# Patient Record
Sex: Female | Born: 1950
Health system: Southern US, Community
[De-identification: ages and names within clinical notes are randomized; demographics above are authoritative.]

## PROBLEM LIST (undated history)

## (undated) DIAGNOSIS — E119 Type 2 diabetes mellitus without complications: Secondary | ICD-10-CM

## (undated) DIAGNOSIS — E785 Hyperlipidemia, unspecified: Secondary | ICD-10-CM

## (undated) DIAGNOSIS — I10 Essential (primary) hypertension: Secondary | ICD-10-CM

## (undated) HISTORY — DX: Essential (primary) hypertension: I10

## (undated) HISTORY — DX: Type 2 diabetes mellitus without complications: E11.9

## (undated) HISTORY — DX: Morbid (severe) obesity due to excess calories: E66.01

## (undated) HISTORY — DX: Hyperlipidemia, unspecified: E78.5

---

## 1995-06-03 HISTORY — PX: ABDOMINAL HYSTERECTOMY: SHX81

## 2002-01-07 ENCOUNTER — Encounter: Admission: RE | Admit: 2002-01-07 | Discharge: 2002-01-07 | Payer: Self-pay | Admitting: Internal Medicine

## 2002-04-21 ENCOUNTER — Encounter: Admission: RE | Admit: 2002-04-21 | Discharge: 2002-04-21 | Payer: Self-pay | Admitting: Internal Medicine

## 2002-08-02 ENCOUNTER — Encounter: Admission: RE | Admit: 2002-08-02 | Discharge: 2002-08-02 | Payer: Self-pay | Admitting: Internal Medicine

## 2002-08-12 ENCOUNTER — Encounter: Admission: RE | Admit: 2002-08-12 | Discharge: 2002-08-12 | Payer: Self-pay | Admitting: Internal Medicine

## 2002-12-13 ENCOUNTER — Ambulatory Visit (HOSPITAL_COMMUNITY): Admission: RE | Admit: 2002-12-13 | Discharge: 2002-12-13 | Payer: Self-pay | Admitting: Family Medicine

## 2003-01-17 ENCOUNTER — Encounter: Admission: RE | Admit: 2003-01-17 | Discharge: 2003-01-17 | Payer: Self-pay | Admitting: Internal Medicine

## 2003-03-29 ENCOUNTER — Encounter: Admission: RE | Admit: 2003-03-29 | Discharge: 2003-03-29 | Payer: Self-pay | Admitting: Internal Medicine

## 2003-04-25 ENCOUNTER — Encounter: Admission: RE | Admit: 2003-04-25 | Discharge: 2003-04-25 | Payer: Self-pay | Admitting: Internal Medicine

## 2003-05-01 ENCOUNTER — Encounter: Admission: RE | Admit: 2003-05-01 | Discharge: 2003-05-01 | Payer: Self-pay | Admitting: Internal Medicine

## 2003-05-02 ENCOUNTER — Encounter: Admission: RE | Admit: 2003-05-02 | Discharge: 2003-05-02 | Payer: Self-pay | Admitting: Internal Medicine

## 2003-10-06 ENCOUNTER — Encounter: Admission: RE | Admit: 2003-10-06 | Discharge: 2003-10-06 | Payer: Self-pay | Admitting: Internal Medicine

## 2003-10-19 ENCOUNTER — Encounter: Admission: RE | Admit: 2003-10-19 | Discharge: 2003-10-19 | Payer: Self-pay | Admitting: Internal Medicine

## 2003-12-20 ENCOUNTER — Encounter: Admission: RE | Admit: 2003-12-20 | Discharge: 2003-12-20 | Payer: Self-pay | Admitting: Internal Medicine

## 2004-01-22 ENCOUNTER — Encounter: Admission: RE | Admit: 2004-01-22 | Discharge: 2004-01-22 | Payer: Self-pay | Admitting: Internal Medicine

## 2004-03-26 ENCOUNTER — Ambulatory Visit: Payer: Self-pay | Admitting: Internal Medicine

## 2004-05-02 ENCOUNTER — Ambulatory Visit: Payer: Self-pay | Admitting: Internal Medicine

## 2004-06-20 ENCOUNTER — Ambulatory Visit: Payer: Self-pay | Admitting: Internal Medicine

## 2004-06-27 ENCOUNTER — Ambulatory Visit: Payer: Self-pay | Admitting: Internal Medicine

## 2004-08-06 ENCOUNTER — Ambulatory Visit: Payer: Self-pay | Admitting: Internal Medicine

## 2004-09-20 ENCOUNTER — Ambulatory Visit: Payer: Self-pay | Admitting: Internal Medicine

## 2004-09-26 ENCOUNTER — Ambulatory Visit: Payer: Self-pay

## 2004-12-18 ENCOUNTER — Ambulatory Visit: Payer: Self-pay | Admitting: Hospitalist

## 2005-02-25 ENCOUNTER — Ambulatory Visit: Payer: Self-pay | Admitting: Hospitalist

## 2005-05-16 ENCOUNTER — Ambulatory Visit: Payer: Self-pay | Admitting: Internal Medicine

## 2005-06-18 ENCOUNTER — Ambulatory Visit: Payer: Self-pay | Admitting: Internal Medicine

## 2005-06-19 ENCOUNTER — Ambulatory Visit: Payer: Self-pay | Admitting: Internal Medicine

## 2005-06-23 ENCOUNTER — Ambulatory Visit: Payer: Self-pay | Admitting: Internal Medicine

## 2005-08-14 ENCOUNTER — Ambulatory Visit: Payer: Self-pay | Admitting: Hospitalist

## 2005-08-19 ENCOUNTER — Ambulatory Visit: Payer: Self-pay | Admitting: Hospitalist

## 2005-09-16 ENCOUNTER — Ambulatory Visit: Payer: Self-pay | Admitting: Hospitalist

## 2005-09-23 ENCOUNTER — Ambulatory Visit: Payer: Self-pay | Admitting: Internal Medicine

## 2005-11-20 ENCOUNTER — Ambulatory Visit: Payer: Self-pay | Admitting: Hospitalist

## 2006-03-17 DIAGNOSIS — E785 Hyperlipidemia, unspecified: Secondary | ICD-10-CM

## 2006-03-17 DIAGNOSIS — Z9079 Acquired absence of other genital organ(s): Secondary | ICD-10-CM | POA: Insufficient documentation

## 2006-03-17 DIAGNOSIS — E119 Type 2 diabetes mellitus without complications: Secondary | ICD-10-CM

## 2006-03-17 DIAGNOSIS — I10 Essential (primary) hypertension: Secondary | ICD-10-CM

## 2006-03-17 DIAGNOSIS — E1142 Type 2 diabetes mellitus with diabetic polyneuropathy: Secondary | ICD-10-CM | POA: Insufficient documentation

## 2006-03-17 DIAGNOSIS — A630 Anogenital (venereal) warts: Secondary | ICD-10-CM

## 2006-03-17 HISTORY — DX: Hyperlipidemia, unspecified: E78.5

## 2006-03-17 HISTORY — DX: Type 2 diabetes mellitus without complications: E11.9

## 2006-03-17 HISTORY — DX: Essential (primary) hypertension: I10

## 2006-07-29 ENCOUNTER — Ambulatory Visit: Payer: Self-pay | Admitting: Hospitalist

## 2006-07-29 ENCOUNTER — Encounter (INDEPENDENT_AMBULATORY_CARE_PROVIDER_SITE_OTHER): Payer: Self-pay | Admitting: Unknown Physician Specialty

## 2006-07-29 LAB — CONVERTED CEMR LAB: Hgb A1c MFr Bld: 7.8 %

## 2006-07-30 ENCOUNTER — Encounter (INDEPENDENT_AMBULATORY_CARE_PROVIDER_SITE_OTHER): Payer: Self-pay | Admitting: Internal Medicine

## 2006-07-31 ENCOUNTER — Ambulatory Visit: Payer: Self-pay | Admitting: Internal Medicine

## 2006-07-31 ENCOUNTER — Encounter (INDEPENDENT_AMBULATORY_CARE_PROVIDER_SITE_OTHER): Payer: Self-pay | Admitting: Unknown Physician Specialty

## 2006-07-31 LAB — CONVERTED CEMR LAB
Albumin: 4.6 g/dL (ref 3.5–5.2)
CO2: 25 meq/L (ref 19–32)
Chloride: 99 meq/L (ref 96–112)
Creatinine, Ser: 0.83 mg/dL (ref 0.40–1.20)
Glucose, Bld: 192 mg/dL — ABNORMAL HIGH (ref 70–99)
Helicobacter Pylori Antibody-IgG: >8 — ABNORMAL HIGH
Indirect Bilirubin: 0.5 mg/dL (ref 0.0–0.9)
Sodium: 140 meq/L (ref 135–145)
Total Protein: 7.6 g/dL (ref 6.0–8.3)

## 2006-08-10 ENCOUNTER — Ambulatory Visit: Payer: Self-pay | Admitting: Hospitalist

## 2006-08-10 ENCOUNTER — Ambulatory Visit: Payer: Self-pay | Admitting: Internal Medicine

## 2006-08-11 ENCOUNTER — Telehealth (INDEPENDENT_AMBULATORY_CARE_PROVIDER_SITE_OTHER): Payer: Self-pay | Admitting: *Deleted

## 2006-08-13 ENCOUNTER — Ambulatory Visit: Payer: Self-pay | Admitting: Hospitalist

## 2006-08-13 LAB — CONVERTED CEMR LAB: Blood Glucose, Fingerstick: 161

## 2006-08-31 ENCOUNTER — Ambulatory Visit: Payer: Self-pay | Admitting: *Deleted

## 2006-08-31 ENCOUNTER — Encounter (INDEPENDENT_AMBULATORY_CARE_PROVIDER_SITE_OTHER): Payer: Self-pay | Admitting: Internal Medicine

## 2006-08-31 LAB — CONVERTED CEMR LAB
ALT: 39 units/L — ABNORMAL HIGH (ref 0–35)
AST: 28 units/L (ref 0–37)
Alkaline Phosphatase: 69 units/L (ref 39–117)
Blood Glucose, Fingerstick: 234
Creatinine, Ser: 0.89 mg/dL (ref 0.40–1.20)
Total Bilirubin: 0.5 mg/dL (ref 0.3–1.2)

## 2006-09-09 ENCOUNTER — Telehealth (INDEPENDENT_AMBULATORY_CARE_PROVIDER_SITE_OTHER): Payer: Self-pay | Admitting: Hospitalist

## 2006-09-25 ENCOUNTER — Ambulatory Visit: Payer: Self-pay | Admitting: Hospitalist

## 2006-09-26 LAB — CONVERTED CEMR LAB
HDL: 52 mg/dL (ref >39)
LDL Cholesterol: 143 mg/dL — ABNORMAL HIGH (ref 0–99)
Total CHOL/HDL Ratio: 4.2

## 2006-11-04 ENCOUNTER — Telehealth (INDEPENDENT_AMBULATORY_CARE_PROVIDER_SITE_OTHER): Payer: Self-pay | Admitting: Pharmacy Technician

## 2006-11-09 ENCOUNTER — Telehealth (INDEPENDENT_AMBULATORY_CARE_PROVIDER_SITE_OTHER): Payer: Self-pay | Admitting: *Deleted

## 2006-12-03 ENCOUNTER — Ambulatory Visit: Payer: Self-pay | Admitting: Hospitalist

## 2006-12-03 DIAGNOSIS — M79609 Pain in unspecified limb: Secondary | ICD-10-CM | POA: Insufficient documentation

## 2006-12-03 LAB — CONVERTED CEMR LAB: Hgb A1c MFr Bld: 7 %

## 2006-12-11 ENCOUNTER — Ambulatory Visit: Payer: Self-pay | Admitting: Vascular Surgery

## 2006-12-11 ENCOUNTER — Ambulatory Visit (HOSPITAL_COMMUNITY): Admission: RE | Admit: 2006-12-11 | Discharge: 2006-12-11 | Payer: Self-pay | Admitting: Hospitalist

## 2006-12-11 ENCOUNTER — Encounter (INDEPENDENT_AMBULATORY_CARE_PROVIDER_SITE_OTHER): Payer: Self-pay | Admitting: Hospitalist

## 2006-12-21 ENCOUNTER — Telehealth (INDEPENDENT_AMBULATORY_CARE_PROVIDER_SITE_OTHER): Payer: Self-pay | Admitting: *Deleted

## 2006-12-25 ENCOUNTER — Encounter (INDEPENDENT_AMBULATORY_CARE_PROVIDER_SITE_OTHER): Payer: Self-pay | Admitting: Hospitalist

## 2006-12-25 ENCOUNTER — Ambulatory Visit: Payer: Self-pay | Admitting: Internal Medicine

## 2006-12-25 LAB — CONVERTED CEMR LAB
BUN: 12 mg/dL (ref 6–23)
Creatinine, Ser: 0.9 mg/dL (ref 0.40–1.20)
Total CK: 137 units/L (ref 7–177)

## 2007-01-05 ENCOUNTER — Telehealth (INDEPENDENT_AMBULATORY_CARE_PROVIDER_SITE_OTHER): Payer: Self-pay | Admitting: Pharmacy Technician

## 2007-01-08 ENCOUNTER — Ambulatory Visit (HOSPITAL_COMMUNITY): Admission: RE | Admit: 2007-01-08 | Discharge: 2007-01-08 | Payer: Self-pay | Admitting: Internal Medicine

## 2007-01-08 ENCOUNTER — Encounter (INDEPENDENT_AMBULATORY_CARE_PROVIDER_SITE_OTHER): Payer: Self-pay | Admitting: Internal Medicine

## 2007-01-08 ENCOUNTER — Ambulatory Visit: Payer: Self-pay | Admitting: Internal Medicine

## 2007-01-08 DIAGNOSIS — M25569 Pain in unspecified knee: Secondary | ICD-10-CM

## 2007-01-08 LAB — CONVERTED CEMR LAB: Blood Glucose, Fingerstick: 278

## 2007-02-16 ENCOUNTER — Telehealth: Payer: Self-pay | Admitting: *Deleted

## 2007-03-04 ENCOUNTER — Telehealth (INDEPENDENT_AMBULATORY_CARE_PROVIDER_SITE_OTHER): Payer: Self-pay | Admitting: Hospitalist

## 2007-03-15 ENCOUNTER — Ambulatory Visit: Payer: Self-pay | Admitting: Hospitalist

## 2007-04-02 ENCOUNTER — Encounter: Admission: RE | Admit: 2007-04-02 | Discharge: 2007-05-24 | Payer: Self-pay | Admitting: Hospitalist

## 2007-06-09 ENCOUNTER — Encounter (INDEPENDENT_AMBULATORY_CARE_PROVIDER_SITE_OTHER): Payer: Self-pay | Admitting: Hospitalist

## 2007-07-15 ENCOUNTER — Telehealth (INDEPENDENT_AMBULATORY_CARE_PROVIDER_SITE_OTHER): Payer: Self-pay | Admitting: Hospitalist

## 2007-07-15 ENCOUNTER — Ambulatory Visit: Payer: Self-pay | Admitting: Hospitalist

## 2007-07-15 LAB — CONVERTED CEMR LAB: Blood Glucose, Fingerstick: 113

## 2007-09-20 ENCOUNTER — Telehealth: Payer: Self-pay | Admitting: *Deleted

## 2007-09-21 ENCOUNTER — Telehealth: Payer: Self-pay | Admitting: *Deleted

## 2007-11-12 ENCOUNTER — Ambulatory Visit: Payer: Self-pay | Admitting: *Deleted

## 2007-11-12 ENCOUNTER — Encounter: Payer: Self-pay | Admitting: Internal Medicine

## 2007-11-12 LAB — CONVERTED CEMR LAB
ALT: 78 units/L — ABNORMAL HIGH (ref 0–35)
AST: 51 units/L — ABNORMAL HIGH (ref 0–37)
Blood Glucose, Fingerstick: 178
CO2: 26 meq/L (ref 19–32)
Hgb A1c MFr Bld: 6.8 %
Sodium: 143 meq/L (ref 135–145)
Total Bilirubin: 0.4 mg/dL (ref 0.3–1.2)
Total Protein: 7.1 g/dL (ref 6.0–8.3)

## 2007-11-24 ENCOUNTER — Telehealth (INDEPENDENT_AMBULATORY_CARE_PROVIDER_SITE_OTHER): Payer: Self-pay | Admitting: Pharmacy Technician

## 2007-11-24 ENCOUNTER — Ambulatory Visit: Payer: Self-pay | Admitting: Hospitalist

## 2007-11-24 LAB — CONVERTED CEMR LAB: Blood Glucose, Fingerstick: 100

## 2007-12-10 ENCOUNTER — Encounter (INDEPENDENT_AMBULATORY_CARE_PROVIDER_SITE_OTHER): Payer: Self-pay | Admitting: Internal Medicine

## 2007-12-10 ENCOUNTER — Ambulatory Visit: Payer: Self-pay | Admitting: Infectious Diseases

## 2007-12-20 LAB — CONVERTED CEMR LAB
LDL Cholesterol: 117 mg/dL — ABNORMAL HIGH (ref 0–99)
Total CHOL/HDL Ratio: 3.7
VLDL: 27 mg/dL (ref 0–40)

## 2008-01-06 ENCOUNTER — Telehealth: Payer: Self-pay | Admitting: *Deleted

## 2008-01-25 ENCOUNTER — Ambulatory Visit: Payer: Self-pay | Admitting: Internal Medicine

## 2008-01-25 DIAGNOSIS — Z6841 Body Mass Index (BMI) 40.0 and over, adult: Secondary | ICD-10-CM

## 2008-01-25 DIAGNOSIS — K760 Fatty (change of) liver, not elsewhere classified: Secondary | ICD-10-CM | POA: Insufficient documentation

## 2008-01-25 HISTORY — DX: Morbid (severe) obesity due to excess calories: E66.01

## 2008-01-25 HISTORY — DX: Fatty (change of) liver, not elsewhere classified: K76.0

## 2008-01-26 LAB — CONVERTED CEMR LAB
ALT: 93 units/L — ABNORMAL HIGH (ref 0–35)
AST: 58 units/L — ABNORMAL HIGH (ref 0–37)
Albumin: 4.5 g/dL (ref 3.5–5.2)
Alkaline Phosphatase: 69 units/L (ref 39–117)
Glucose, Bld: 134 mg/dL — ABNORMAL HIGH (ref 70–99)
Potassium: 4.2 meq/L (ref 3.5–5.3)
Sodium: 143 meq/L (ref 135–145)
Total Bilirubin: 0.5 mg/dL (ref 0.3–1.2)
Total Protein: 7.8 g/dL (ref 6.0–8.3)

## 2008-01-31 ENCOUNTER — Ambulatory Visit: Payer: Self-pay | Admitting: Internal Medicine

## 2008-02-03 ENCOUNTER — Ambulatory Visit: Payer: Self-pay | Admitting: Internal Medicine

## 2008-02-24 ENCOUNTER — Telehealth: Payer: Self-pay | Admitting: *Deleted

## 2008-03-08 ENCOUNTER — Encounter (INDEPENDENT_AMBULATORY_CARE_PROVIDER_SITE_OTHER): Payer: Self-pay | Admitting: Internal Medicine

## 2008-03-13 ENCOUNTER — Encounter (INDEPENDENT_AMBULATORY_CARE_PROVIDER_SITE_OTHER): Payer: Self-pay | Admitting: Internal Medicine

## 2008-03-14 ENCOUNTER — Ambulatory Visit: Payer: Self-pay | Admitting: Internal Medicine

## 2008-03-15 LAB — CONVERTED CEMR LAB
ALT: 73 units/L — ABNORMAL HIGH (ref 0–35)
AST: 54 units/L — ABNORMAL HIGH (ref 0–37)
Alkaline Phosphatase: 67 units/L (ref 39–117)
Bilirubin, Direct: 0.2 mg/dL (ref 0.0–0.3)
Indirect Bilirubin: 0.4 mg/dL (ref 0.0–0.9)
Total Bilirubin: 0.6 mg/dL (ref 0.3–1.2)

## 2008-03-16 ENCOUNTER — Ambulatory Visit (HOSPITAL_COMMUNITY): Admission: RE | Admit: 2008-03-16 | Discharge: 2008-03-16 | Payer: Self-pay | Admitting: Internal Medicine

## 2008-03-27 ENCOUNTER — Ambulatory Visit: Payer: Self-pay | Admitting: Internal Medicine

## 2008-03-27 LAB — CONVERTED CEMR LAB: OCCULT 3: NEGATIVE

## 2008-04-04 ENCOUNTER — Encounter (INDEPENDENT_AMBULATORY_CARE_PROVIDER_SITE_OTHER): Payer: Self-pay | Admitting: Internal Medicine

## 2008-04-07 ENCOUNTER — Telehealth (INDEPENDENT_AMBULATORY_CARE_PROVIDER_SITE_OTHER): Payer: Self-pay | Admitting: Pharmacy Technician

## 2008-04-18 ENCOUNTER — Encounter (INDEPENDENT_AMBULATORY_CARE_PROVIDER_SITE_OTHER): Payer: Self-pay | Admitting: Internal Medicine

## 2008-07-05 ENCOUNTER — Ambulatory Visit: Payer: Self-pay | Admitting: Internal Medicine

## 2008-07-07 LAB — CONVERTED CEMR LAB
ALT: 39 units/L — ABNORMAL HIGH (ref 0–35)
Albumin: 4.2 g/dL (ref 3.5–5.2)
CO2: 23 meq/L (ref 19–32)
Cholesterol: 194 mg/dL (ref 0–200)
Glucose, Bld: 208 mg/dL — ABNORMAL HIGH (ref 70–99)
LDL Cholesterol: 105 mg/dL — ABNORMAL HIGH (ref 0–99)
Potassium: 4.3 meq/L (ref 3.5–5.3)
Sodium: 141 meq/L (ref 135–145)
Total Bilirubin: 0.4 mg/dL (ref 0.3–1.2)
Total Protein: 7.3 g/dL (ref 6.0–8.3)
Triglycerides: 133 mg/dL (ref <150)
VLDL: 27 mg/dL (ref 0–40)

## 2008-08-04 ENCOUNTER — Ambulatory Visit: Payer: Self-pay | Admitting: Internal Medicine

## 2008-08-07 LAB — CONVERTED CEMR LAB
AST: 17 units/L (ref 0–37)
Albumin: 4.2 g/dL (ref 3.5–5.2)
Alkaline Phosphatase: 58 units/L (ref 39–117)
BUN: 12 mg/dL (ref 6–23)
Calcium: 9.6 mg/dL (ref 8.4–10.5)
Chloride: 105 meq/L (ref 96–112)
LDL Cholesterol: 112 mg/dL — ABNORMAL HIGH (ref 0–99)
Potassium: 4.5 meq/L (ref 3.5–5.3)
Sodium: 144 meq/L (ref 135–145)
Total Protein: 7.1 g/dL (ref 6.0–8.3)

## 2008-09-06 ENCOUNTER — Telehealth (INDEPENDENT_AMBULATORY_CARE_PROVIDER_SITE_OTHER): Payer: Self-pay | Admitting: Pharmacy Technician

## 2008-09-26 ENCOUNTER — Telehealth (INDEPENDENT_AMBULATORY_CARE_PROVIDER_SITE_OTHER): Payer: Self-pay | Admitting: Pharmacy Technician

## 2008-09-27 ENCOUNTER — Telehealth (INDEPENDENT_AMBULATORY_CARE_PROVIDER_SITE_OTHER): Payer: Self-pay | Admitting: Pharmacy Technician

## 2008-10-24 ENCOUNTER — Ambulatory Visit: Payer: Self-pay | Admitting: Internal Medicine

## 2008-10-24 LAB — CONVERTED CEMR LAB
Blood Glucose, Fingerstick: 145
Hgb A1c MFr Bld: 6.7 %

## 2008-10-24 LAB — HM DIABETES FOOT EXAM

## 2008-10-26 ENCOUNTER — Encounter (INDEPENDENT_AMBULATORY_CARE_PROVIDER_SITE_OTHER): Payer: Self-pay | Admitting: Internal Medicine

## 2008-12-20 ENCOUNTER — Telehealth: Payer: Self-pay | Admitting: *Deleted

## 2008-12-27 ENCOUNTER — Telehealth: Payer: Self-pay | Admitting: *Deleted

## 2009-01-18 ENCOUNTER — Ambulatory Visit: Payer: Self-pay | Admitting: Internal Medicine

## 2009-01-18 LAB — CONVERTED CEMR LAB
Blood Glucose, Fingerstick: 199
Hgb A1c MFr Bld: 6.9 %

## 2009-02-13 ENCOUNTER — Ambulatory Visit: Payer: Self-pay | Admitting: Internal Medicine

## 2009-03-22 ENCOUNTER — Telehealth (INDEPENDENT_AMBULATORY_CARE_PROVIDER_SITE_OTHER): Payer: Self-pay | Admitting: *Deleted

## 2009-04-24 ENCOUNTER — Ambulatory Visit: Payer: Self-pay | Admitting: Internal Medicine

## 2009-04-24 DIAGNOSIS — M542 Cervicalgia: Secondary | ICD-10-CM

## 2009-04-24 DIAGNOSIS — R05 Cough: Secondary | ICD-10-CM

## 2009-04-24 LAB — CONVERTED CEMR LAB: Hgb A1c MFr Bld: 7 %

## 2009-04-25 ENCOUNTER — Encounter: Payer: Self-pay | Admitting: Internal Medicine

## 2009-05-08 ENCOUNTER — Ambulatory Visit: Payer: Self-pay | Admitting: Internal Medicine

## 2009-05-08 LAB — CONVERTED CEMR LAB
AST: 16 units/L (ref 0–37)
Albumin: 4.1 g/dL (ref 3.5–5.2)
BUN: 12 mg/dL (ref 6–23)
CO2: 26 meq/L (ref 19–32)
Calcium: 9.8 mg/dL (ref 8.4–10.5)
Chloride: 101 meq/L (ref 96–112)
Cholesterol: 211 mg/dL — ABNORMAL HIGH (ref 0–200)
Creatinine, Ser: 0.8 mg/dL (ref 0.40–1.20)
Glucose, Bld: 178 mg/dL — ABNORMAL HIGH (ref 70–99)
HDL: 53 mg/dL (ref >39)
Potassium: 3.9 meq/L (ref 3.5–5.3)
Triglycerides: 175 mg/dL — ABNORMAL HIGH (ref <150)

## 2009-05-18 ENCOUNTER — Ambulatory Visit: Payer: Self-pay | Admitting: Infectious Diseases

## 2009-06-25 ENCOUNTER — Ambulatory Visit (HOSPITAL_COMMUNITY): Admission: RE | Admit: 2009-06-25 | Discharge: 2009-06-25 | Payer: Self-pay | Admitting: Gastroenterology

## 2009-08-03 ENCOUNTER — Telehealth: Payer: Self-pay | Admitting: Internal Medicine

## 2009-08-03 ENCOUNTER — Ambulatory Visit: Payer: Self-pay | Admitting: Internal Medicine

## 2009-08-17 ENCOUNTER — Telehealth (INDEPENDENT_AMBULATORY_CARE_PROVIDER_SITE_OTHER): Payer: Self-pay | Admitting: *Deleted

## 2009-08-21 ENCOUNTER — Encounter: Payer: Self-pay | Admitting: Internal Medicine

## 2009-08-23 ENCOUNTER — Ambulatory Visit: Payer: Self-pay | Admitting: Internal Medicine

## 2009-08-23 LAB — CONVERTED CEMR LAB
BUN: 13 mg/dL (ref 6–23)
CO2: 24 meq/L (ref 19–32)
Cholesterol: 217 mg/dL — ABNORMAL HIGH (ref 0–200)
Glucose, Bld: 198 mg/dL — ABNORMAL HIGH (ref 70–99)
HDL: 57 mg/dL (ref >39)
Sodium: 141 meq/L (ref 135–145)
Total Bilirubin: 0.5 mg/dL (ref 0.3–1.2)
Total Protein: 7.2 g/dL (ref 6.0–8.3)
Triglycerides: 125 mg/dL (ref <150)
VLDL: 25 mg/dL (ref 0–40)

## 2009-08-24 ENCOUNTER — Ambulatory Visit: Payer: Self-pay | Admitting: Internal Medicine

## 2009-09-18 ENCOUNTER — Telehealth: Payer: Self-pay | Admitting: Internal Medicine

## 2009-11-27 ENCOUNTER — Telehealth: Payer: Self-pay | Admitting: Internal Medicine

## 2009-11-30 ENCOUNTER — Ambulatory Visit: Payer: Self-pay | Admitting: Internal Medicine

## 2009-11-30 LAB — CONVERTED CEMR LAB
Blood Glucose, Fingerstick: 156
Hgb A1c MFr Bld: 7.8 %

## 2009-12-05 ENCOUNTER — Telehealth: Payer: Self-pay | Admitting: Internal Medicine

## 2010-01-09 ENCOUNTER — Telehealth: Payer: Self-pay | Admitting: Internal Medicine

## 2010-01-11 ENCOUNTER — Telehealth: Payer: Self-pay | Admitting: Internal Medicine

## 2010-01-14 ENCOUNTER — Telehealth: Payer: Self-pay | Admitting: Internal Medicine

## 2010-02-05 ENCOUNTER — Telehealth: Payer: Self-pay | Admitting: Internal Medicine

## 2010-02-11 ENCOUNTER — Ambulatory Visit: Payer: Self-pay | Admitting: Internal Medicine

## 2010-02-20 ENCOUNTER — Ambulatory Visit (HOSPITAL_COMMUNITY): Admission: RE | Admit: 2010-02-20 | Discharge: 2010-02-20 | Payer: Self-pay | Admitting: Internal Medicine

## 2010-02-20 LAB — HM MAMMOGRAPHY: HM Mammogram: NEGATIVE

## 2010-06-23 ENCOUNTER — Encounter: Payer: Self-pay | Admitting: Internal Medicine

## 2010-07-04 NOTE — Progress Notes (Signed)
Summary: refill/ hla  Phone Note Refill Request Message from:  Fax from Pharmacy on January 09, 2010 5:58 PM  Refills Requested: Medication #1:  HYDROCHLOROTHIAZIDE 25 MG TABS Take 1 tablet by mouth once a day.   Dosage confirmed as above?Dosage Confirmed   Last Refilled: 6/28 Initial call taken by: Freddy Finner RN,  January 09, 2010 5:58 PM  Follow-up for Phone Call        Rx submitted to pharmacy. Follow-up by: Eduardo Osier DO,  January 11, 2010 11:32 AM    Prescriptions: HYDROCHLOROTHIAZIDE 25 MG TABS (HYDROCHLOROTHIAZIDE) Take 1 tablet by mouth once a day.  #30 Each x 6   Entered and Authorized by:   Eduardo Osier DO   Signed by:   Eduardo Osier DO on 01/11/2010   Method used:   Electronically to        [facility] [phone]* (retail)       [address]       [place], [place]  [address]       Ph: [phone]       Fax: [phone]   RxID:   [id number]

## 2010-07-04 NOTE — Assessment & Plan Note (Signed)
Summary: CHECKUP/SB.   Vital Signs:  Patient profile:   60 year old female Height:      65 inches (165.10 cm) Weight:      293.04 pounds (133.20 kg) BMI:     48.94 Temp:     98 degrees F (36.67 degrees C) oral Pulse rate:   83 / minute BP sitting:   145 / 83  (right arm) Cuff size:   large  Vitals Entered By: Sander Nephew RN (February 11, 2010 8:33 AM) Is Patient Diabetic? Yes Did you bring your meter with you today? No Pain Assessment Patient in pain? no      Nutritional Status BMI of > 30 = obese CBG Result 287  Have you ever been in a relationship where you felt threatened, hurt or afraid?No   Does patient need assistance? Functional Status Self care Ambulation Normal Comments Needs refills done for 90 days.  Check up   Primary Care Provider:  Eduardo Osier DO   History of Present Illness: Ms Sonya Lane is a 60 yo woman who is in today for follow up of her multiple medical problems.   1. DM - Taking meds as directed. No signs or symptoms of hypoglycemia.  Trying to avoid eating too much prepared/prepackaged food. 2. HTN -    BP still elevated today but improved with increase in carvedilol dose. Pt would prefer to not re-start ace 2/2 cough.  No adverse effects from other anti-HTN agents.  Denies HA, visual changes, dizziness, syncope,C/P, SOB, and DOE. 3. Hyperlipidemia - Still refusing statin.  Pt trying to increase dietary fiber intake and eat healthier. 4. Obesity - Trying to lose weight. Walking 3x a week.   Ms. Scarola has no other concerns or complaints today.  Depression History:      The patient denies a depressed mood most of the day.         Preventive Screening-Counseling & Management  Alcohol-Tobacco     Alcohol drinks/day: 0     Smoking Status: never  Current Medications (verified): 1)  Lisinopril 20 Mg  Tabs (Lisinopril) .... On Hold Re: Cough 2)  Hydrochlorothiazide 25 Mg Tabs (Hydrochlorothiazide) .... Take 1 Tablet By Mouth Once A Day. 3)   Glipizide 10 Mg Tabs (Glipizide) .... Take 1 Tablet By Mouth Twice A Day. 4)  Glucophage 1000 Mg Tabs (Metformin Hcl) .... Take One Tablet By Mouth Two Times A Day. 5)  Womens Multivitamin Plus  Tabs (Multiple Vitamins-Minerals) .... Take 1 Tablet By Mouth Once A Day 6)  Aspirin 81 Mg Tabs (Aspirin) .... Take 1 Tablet By Mouth Once A Day. 7)  Norvasc 10 Mg  Tabs (Amlodipine Besylate) .... Take 1 Tablet By Mouth Once A Day 8)  Carvedilol 25 Mg Tabs (Carvedilol) .... Take 1 Tablet By Mouth Two Times A Day 9)  Flexeril 5 Mg Tabs (Cyclobenzaprine Hcl) .... Take One Tablet By Mouth At Bedtime For Muscle Pain/spasm 10)  Claritin 10 Mg Tabs (Loratadine) .... Take 1 Tablet By Mouth Once A Day  Allergies (verified): 1)  ! Zocor 2)  ! Pravachol  Past History:  Past medical, surgical, family and social histories (including risk factors) reviewed for relevance to current acute and chronic problems.  Past Medical History: Reviewed history from 07/15/2007 and no changes required. Diabetes mellitus, type II- dx'd 2001 -  Eye exam 2006 -  Nl M/C ratio 7/05 Hyperlipidemia Hypertension- no sig proteinuira 7/05 Elevated Liver enzymes on Zocor. Most recentpanel nl 3/07 -  Hep B/C  neg 12/05 Human papillomavirus, vaginal warts UTI 10/04 Varicose veins, R>L leg OA- R knee tricompartmental medial/patellofemoral joint  Past Surgical History: Reviewed history from 03/17/2006 and no changes required. Caesarean section x 2, yrs unknown Hysterectomy, TAHBSOsecondry to endometrial bleeding, yr unknown  Family History: Reviewed history from 03/14/2008 and no changes required. Mother died of gyn cancer, unclear what type  Social History: Reviewed history from 04/24/2009 and no changes required. She has 5 children, 10 grandchildren.  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   Head:  normocephalic and atraumatic.   Eyes:  vision grossly intact, pupils equal, pupils round, and pupils reactive to light.   Mouth:  pharynx pink and moist and fair dentition.   Neck:  supple, full ROM, no masses, no thyroid nodules or tenderness, no JVD, no carotid bruits, and no cervical lymphadenopathy.   Lungs:  no accessory muscle use, normal breath sounds, no crackles, and no wheezes.   Heart:  normal rate, regular rhythm, no murmur, no gallop, and no rub.   Abdomen:  soft, non-tender, normal bowel sounds, no distention, no masses, and no guarding.   Extremities:  No clubbing, cyanosis, or edema. Neurologic:  alert & oriented X3, cranial nerves II-XII intact, strength normal in all extremities, sensation intact to light touch, and DTRs symmetrical and normal.   Skin:  turgor normal, no rashes, no ecchymoses, no petechiae, and no ulcerations.   Psych:  normally interactive, good eye contact, not anxious appearing, and not depressed appearing.     Impression & Recommendations:  Problem # 1:  HYPERTENSION (ICD-401.9) BP improved with increased Carvedilol.  Still slightly above goal, however pt reports drinking coffee prior to her appt this am.  Will continue to monitor closely before making further changed to her regimen.  Her updated medication list for this problem includes:    Lisinopril 20 Mg Tabs (Lisinopril) ..... On hold re: cough    Hydrochlorothiazide 25 Mg Tabs (Hydrochlorothiazide) .Marland Kitchen... Take 1 tablet by mouth once a day.    Norvasc 10 Mg Tabs (Amlodipine besylate) .Marland Kitchen... Take 1 tablet by mouth once a day    Carvedilol 25 Mg Tabs (Carvedilol) .Marland Kitchen... Take 1 tablet by mouth two times a day  BP today: 145/83 Prior BP: 151/88 (11/30/2009)  Labs Reviewed: K+: 4.0 (08/23/2009) Creat: : 0.79 (08/23/2009)   Chol: 217 (08/23/2009)   HDL: 57 (08/23/2009)    LDL: 135 (08/23/2009)   TG: 125 (08/23/2009)  Problem # 2:  DIABETES MELLITUS, TYPE II (ICD-250.00) Pt is compliant with her diabetic meds.  Not due for A1c yet, will check this at her next appt.  Cannot resume ACE inhibitor 2/2 cough; pt cannot afford ARB.    Her updated medication list for this problem includes:    Lisinopril 20 Mg Tabs (Lisinopril) ..... On hold re: cough    Glipizide 10 Mg Tabs (Glipizide) .Marland Kitchen... Take 1 tablet by mouth twice a day.    Glucophage 1000 Mg Tabs (Metformin hcl) .Marland Kitchen... Take one tablet by mouth two times a day.    Aspirin 81 Mg Tabs (Aspirin) .Marland Kitchen... Take 1 tablet by mouth once a day.  Orders: Capillary Blood Glucose/CBG [phone])  Labs Reviewed: Creat: 0.79 (08/23/2009)    Reviewed HgBA1c results: 7.8 (11/30/2009)  7.7 (08/24/2009)  Problem # 3:  PREVENTIVE  HEALTH CARE (ICD-V70.0) Pt is UTD on colonoscopy.  SHe is due for annual mammorgram.  WIll give flu vax today.  Complete Medication List: 1)  Lisinopril 20 Mg Tabs (Lisinopril) .... On hold re: cough 2)  Hydrochlorothiazide 25 Mg Tabs (Hydrochlorothiazide) .... Take 1 tablet by mouth once a day. 3)  Glipizide 10 Mg Tabs (Glipizide) .... Take 1 tablet by mouth twice a day. 4)  Glucophage 1000 Mg Tabs (Metformin hcl) .... Take one tablet by mouth two times a day. 5)  Womens Multivitamin Plus Tabs (Multiple vitamins-minerals) .... Take 1 tablet by mouth once a day 6)  Aspirin 81 Mg Tabs (Aspirin) .... Take 1 tablet by mouth once a day. 7)  Norvasc 10 Mg Tabs (Amlodipine besylate) .... Take 1 tablet by mouth once a day 8)  Carvedilol 25 Mg Tabs (Carvedilol) .... Take 1 tablet by mouth two times a day 9)  Flexeril 5 Mg Tabs (Cyclobenzaprine hcl) .... Take one tablet by mouth at bedtime for muscle pain/spasm 10)  Claritin 10 Mg Tabs (Loratadine) .... Take 1 tablet by mouth once a day  Other Orders: Mammogram (Screening) (Mammo)  Patient Instructions: 1)  Please schedule a follow-up appointment in 6 months  with Dr. Jerelene Redden, or sooner if needed. 2)  Keep taking your medicines every day. 3)  Your last A1c was 7.8 in July of 2011.  Try to limit the amount of sugars, sodas, sweet foods, and carbohydrates to get this number back down to 7.0. 4)  Keep up with your walking routine. 5)  See you in 6 months! Prescriptions: HYDROCHLOROTHIAZIDE 25 MG TABS (HYDROCHLOROTHIAZIDE) Take 1 tablet by mouth once a day.  #90 x 3   Entered and Authorized by:   Eduardo Osier DO   Signed by:   Eduardo Osier DO on 02/11/2010   Method used:   Electronically to        [facility] [phone]* (retail)       [address]       [place], [place]  [address]       Ph: [phone]       Fax: [phone]   RxID:   [id number] CARVEDILOL 25 MG TABS (CARVEDILOL) Take 1 tablet by mouth two times a day  #180 x 3   Entered and Authorized by:   Eduardo Osier DO   Signed by:   Eduardo Osier DO on 02/11/2010   Method used:   Electronically to        [facility] [phone]* (retail)       [address]       [place], [place]  [address]       Ph: [phone]       Fax: [phone]   RxID:   [id number]   Prevention & Chronic Care Immunizations   Influenza vaccine: Fluvax Non-MCR  (03/14/2008)   Influenza vaccine due: 03/14/2009    Tetanus booster: Not documented    Pneumococcal vaccine: Pneumovax  (03/14/2008)   Pneumococcal vaccine due: None    H. zoster vaccine: Not documented   H. zoster vaccine deferral: Deferred  (04/24/2009)  Colorectal Screening   Hemoccult: normal  (03/21/2008)   Hemoccult due: 03/21/2009    Colonoscopy: Not documented   Colonoscopy action/deferral: Deferred  (02/11/2010)  Other Screening   Pap smear: Not documented   Pap smear action/deferral: Not indicated S/P hysterectomy  (02/11/2010)    Mammogram: No specific mammographic evidence of malignancy.  No significant changes compared to previous study.  Assessment: BIRADS 1. Location: [facility].    (04/18/2008)   Mammogram action/deferral: Ordered  (02/11/2010)   Mammogram due: 05/2009    DXA bone density scan: Not documented   Smoking status: never  (02/11/2010)  Diabetes Mellitus   HgbA1C: 7.8  (11/30/2009)   HgbA1C action/deferral: Ordered  (01/18/2009)   Hemoglobin A1C due: 02/12/2008    Eye exam: Not documented   Diabetic eye exam action/deferral: Ophthalmology referral  (05/18/2009)    Foot exam: yes  (10/24/2008)   Foot exam action/deferral: Do today   High risk foot: No  (11/30/2009)   Foot care education: Done  (11/30/2009)    Urine microalbumin/creatinine ratio: 18.4  (04/25/2009)   Urine microalbumin action/deferral: Ordered    Diabetes flowsheet reviewed?: Yes   Progress toward A1C goal: Unchanged  Lipids   Total Cholesterol: 217  (08/23/2009)   Lipid panel action/deferral: Deferred   LDL: 135  (08/23/2009)   LDL Direct: Not documented   HDL: 57  (08/23/2009)   Triglycerides: 125  (08/23/2009)    SGOT (AST): 19  (08/23/2009)   SGPT (ALT): 25  (08/23/2009)   Alkaline phosphatase: 67  (08/23/2009)   Total bilirubin: 0.5  (08/23/2009)    Lipid flowsheet reviewed?: Yes   Progress toward LDL goal: Unchanged  Hypertension   Last Blood Pressure: 145 / 83  (02/11/2010)   Serum creatinine: 0.79  (08/23/2009)   Serum potassium 4.0  (08/23/2009)    Hypertension flowsheet reviewed?: Yes   Progress toward BP goal: Unchanged  Self-Management Support :   Personal Goals (by the next clinic visit) :     Personal A1C goal: 7  (05/18/2009)     Personal blood pressure goal: 130/80  (05/18/2009)     Personal LDL goal: 100  (05/18/2009)    Patient will work on the following items until the next clinic visit to reach self-care goals:     Medications and monitoring: take my medicines every day, bring all of my medications to every visit, examine my feet every day  (02/11/2010)     Eating: drink diet soda or water instead of juice or soda, eat more  vegetables, use fresh or frozen vegetables, eat foods that are low in salt, eat baked foods instead of fried foods, eat fruit for snacks and desserts, limit or avoid alcohol  (02/11/2010)     Activity: take a 30 minute walk every day  (02/11/2010)     Other: NO METER  (04/24/2009)    Diabetes self-management support: Written self-care plan, Education handout, Pre-printed educational material, Resources for patients handout  (02/11/2010)   Diabetes care plan printed   Diabetes education handout printed    Hypertension self-management support: Written self-care plan, Education handout, Pre-printed educational material, Resources for patients handout  (02/11/2010)   Hypertension self-care plan printed.   Hypertension education handout printed    Lipid self-management support: Written self-care plan, Education handout, Pre-printed educational material, Resources for patients handout  (02/11/2010)   Lipid self-care plan printed.   Lipid education handout printed      Resource handout printed.   Nursing Instructions: Give Flu vaccine today  Schedule screening mammogram (see order)     Vital Signs:  Patient profile:   60 year old female Height:      65 inches (165.10 cm) Weight:      293.04 pounds (133.20 kg) BMI:     48.94 Temp:     98 degrees F (36.67 degrees C) oral Pulse rate:  83 / minute BP sitting:   145 / 83  (right arm) Cuff size:   large  Vitals Entered By: Sander Nephew RN (February 11, 2010 8:33 AM)    Appended Document: CHECKUP/SB.   Immunizations Administered:  Influenza Vaccine # 1:    Vaccine Type: Fluvax Non-MCR    Site: right deltoid    Mfr: GlaxoSmithKline    Dose: 0.5 ml    Route: IM    Given by: Sander Nephew RN    Exp. Date: 11/30/2010    Lot #: [id number]    VIS given: 12/25/09 version given February 11, 2010.  Flu Vaccine Consent Questions:    Do you have a history of severe allergic reactions to this vaccine? no    Any prior history of  allergic reactions to egg and/or gelatin? no    Do you have a sensitivity to the preservative Thimersol? no    Do you have a past history of Guillan-Barre Syndrome? no    Do you currently have an acute febrile illness? no    Have you ever had a severe reaction to latex? no    Vaccine information given and explained to patient? yes    Are you currently pregnant? no

## 2010-07-04 NOTE — Progress Notes (Signed)
Summary: Prescription  Phone Note Call from Patient   Caller: Patient Call For: Sonya Osier DO Summary of Call: Call from pt said that she has not received her prescription for Norvasc.  [facility]Prescription was faxed in on 01/11/2010 to [facility].  S[oke with Clarene Critchley said that the pharmacy is behind on refills.  Prescription was called to [facility] and she will get it to [facility].  [facility] will call the pt when ready.  RTC to pt given message that she will be called when the prescription is ready for pick up. Sander Nephew RN  January 14, 2010 2:35 PM  Initial call taken by: Sander Nephew RN,  January 14, 2010 2:35 PM  Follow-up for Phone Call        Do I need to write a new script and re-fax it? Follow-up by: Sonya Osier DO,  January 15, 2010 10:15 AM  Additional Follow-up for Phone Call Additional follow up Details #1::        Pharmacy was called the new prescription on 01/14/2010.  Additional Follow-up by: Sander Nephew RN,  January 16, 2010 9:41 AM    Additional Follow-up for Phone Call Additional follow up Details #2::    Thanks! Follow-up by: Sonya Osier DO,  January 16, 2010 10:39 AM

## 2010-07-04 NOTE — Assessment & Plan Note (Signed)
Summary: EST-CK/FU/MEDS/CFB   Vital Signs:  Patient profile:   60 year old female Height:      65 inches (165.10 cm) Weight:      295.6 pounds (134.36 kg) BMI:     49.37 Temp:     97.9 degrees F oral Pulse rate:   91 / minute BP sitting:   151 / 88  (right arm)  Vitals Entered By: Morrison Old RN (November 30, 2009 4:04 PM) CC: F/U visit. No c/o's. Is Patient Diabetic? Yes Did you bring your meter with you today? No Pain Assessment Patient in pain? no      Nutritional Status BMI of > 30 = obese CBG Result 156  Have you ever been in a relationship where you felt threatened, hurt or afraid?No   Does patient need assistance? Functional Status Self care Ambulation Normal   Diabetic Foot Exam Last Podiatry Exam Date: 11/30/2009  Foot Inspection Is there a history of a foot ulcer?              No Is there a foot ulcer now?              No Can the patient see the bottom of their feet?          No Are the shoes appropriate in style and fit?          Yes Is there swelling or an abnormal foot shape?          No Are the toenails long?                No Are the toenails thick?                No Are the toenails ingrown?              No Is there heavy callous build-up?              No  Diabetic Foot Care Education Patient educated on appropriate care of diabetic feet.  Pulse Check          Right Foot          Left Foot Dorsalis Pedis:        normal            diminished  High Risk Feet? No   10-g (5.07) Semmes-Weinstein Monofilament Test Performed by: Morrison Old RN          Right Foot          Left Foot Visual Inspection     normal         normal Test Control      normal         normal Site 1         normal         normal Site 2         normal         normal Site 3         normal         normal Site 4         normal         normal Site 5         normal         normal Site 6         normal         normal Site 7         normal  normal Site 8         normal          normal Site 9         normal         normal   Primary Care Provider:  Eduardo Osier DO  CC:  F/U visit. No c/o's..  History of Present Illness: Ms Gardenhire is a 60 yo woman who is in today for follow up of her multiple medical problems.   1. DM - Taking meds as directed. No signs or symptoms of hypoglycemia.  2. HTN -    BP still elevated today and unchanged from her last appt at which time her Carvedilol was increased.  Ms Mikkelson did not take the increased dose of Carvedilol as directed and has only been taking one of her 12.30m tabs BID.  Pt would prefer to not re-start ace 2/2 cough.  Denies HA, visual changes, dizziness, syncope,C/P, SOB, and DOE. 3. Hyperlipidemia - Still refusing statin.  Pt trying to increase dietary fiber intake and eat healthier. 4. Obesity - Trying to lose weight. Walking 3x a week.   Ms. WFaxonhas no other concerns or complaints today.  Depression History:      The patient denies a depressed mood most of the day.         Preventive Screening-Counseling & Management  Alcohol-Tobacco     Alcohol drinks/day: 0     Smoking Status: never  Caffeine-Diet-Exercise     Does Patient Exercise: yes     Type of exercise: WATER AROBIC     Times/week: 3  Current Problems (verified): 1)  Other Screening Mammogram  (ICD-V76.12) 2)  Cough  (ICD-786.2) 3)  Foot Pain, Right  (ICD-729.5) 4)  Neck Pain, Acute  (ICD-723.1) 5)  Encounter For Long-term Use of Other Medications  (ICD-V58.69) 6)  Morbid Obesity  (ICD-278.01) 7)  Transaminases, Serum, Elevated  (ICD-790.4) 8)  Preventive Health Care  (ICD-V70.0) 9)  Knee Pain  (ICD-719.46) 10)  Screening For Malignant Neoplasm, Colon  (ICD-V76.51) 11)  Leg Pain, Right  (ICD-729.5) 12)  Broken Tooth, Infected  (IDUK-025.42 13)  Helicobacter Pylori Gastritis  (ICD-041.86) 14)  Hx of Venereal Wart  (ICD-078.11) 15)  Total Abdominal Hysterectomy, Hx of  (ICD-V45.77) 16)  Caesarean Section, Hx of  (ICD-V39.01) 17)   Hypertension  (ICD-401.9) 18)  Hyperlipidemia  (ICD-272.4) 19)  Diabetes Mellitus, Type II  (ICD-250.00)  Current Medications (verified): 1)  Lisinopril 20 Mg  Tabs (Lisinopril) .... On Hold Re: Cough 2)  Hydrochlorothiazide 25 Mg Tabs (Hydrochlorothiazide) .... Take 1 Tablet By Mouth Once A Day. 3)  Glipizide 10 Mg Tabs (Glipizide) .... Take 1 Tablet By Mouth Twice A Day. 4)  Glucophage 1000 Mg Tabs (Metformin Hcl) .... Take One Tablet By Mouth Two Times A Day. 5)  Womens Multivitamin Plus  Tabs (Multiple Vitamins-Minerals) .... Take 1 Tablet By Mouth Once A Day 6)  Aspirin 81 Mg Tabs (Aspirin) .... Take 1 Tablet By Mouth Once A Day. 7)  Norvasc 10 Mg  Tabs (Amlodipine Besylate) .... Take 1 Tablet By Mouth Once A Day 8)  Carvedilol 25 Mg Tabs (Carvedilol) 9)  Flexeril 5 Mg Tabs (Cyclobenzaprine Hcl) .... Take One Tablet By Mouth At Bedtime For Muscle Pain/spasm 10)  Claritin 10 Mg Tabs (Loratadine) .... Take 1 Tablet By Mouth Once A Day  Allergies (verified): 1)  ! Zocor 2)  ! Pravachol  Past History:  Past medical, surgical, family and social histories (including  risk factors) reviewed for relevance to current acute and chronic problems.  Past Medical History: Reviewed history from 07/15/2007 and no changes required. Diabetes mellitus, type II- dx'd 2001 -  Eye exam 2006 -  Nl M/C ratio 7/05 Hyperlipidemia Hypertension- no sig proteinuira 7/05 Elevated Liver enzymes on Zocor. Most recentpanel nl 3/07 -  Hep B/C neg 12/05 Human papillomavirus, vaginal warts UTI 10/04 Varicose veins, R>L leg OA- R knee tricompartmental medial/patellofemoral joint  Past Surgical History: Reviewed history from 03/17/2006 and no changes required. Caesarean section x 2, yrs unknown Hysterectomy, TAHBSOsecondry to endometrial bleeding, yr unknown  Family History: Reviewed history from 03/14/2008 and no changes required. Mother died of gyn cancer, unclear what type  Social History: Reviewed  history from 04/24/2009 and no changes required. She has 5 children, 10 grandchildren.  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   Head:  normocephalic and atraumatic.   Eyes:  vision grossly intact, pupils equal, pupils round, and pupils reactive to light.   Mouth:  pharynx pink and moist and fair dentition.   Lungs:  no accessory muscle use, normal breath sounds, no crackles, and no wheezes.   Heart:  normal rate, regular rhythm, no murmur, no gallop, and no rub.   Abdomen:  soft, non-tender, normal bowel sounds, no distention, no masses, and no guarding.   Extremities:  No clubbing, cyanosis, or edema. Neurologic:  alert & oriented X3, cranial nerves II-XII intact, strength normal in all extremities, sensation intact to light touch, and DTRs symmetrical and normal.   Psych:  normally interactive, good eye contact, not anxious appearing, and not depressed appearing.     Impression & Recommendations:  Problem # 1:  HYPERTENSION (ICD-401.9) Will increase carvedilol to 59m two times a day as discussed during Ms. Kleist's previous visit.  Stressed the need for good BP control to prevent debilitating and potentially fatal sequelae of persistent poorly controlled blood pressure.  Pt expresses understanding.  Encouraged pt to take all medicines regularly as directed.   Will bring her back in 2-4 weeks for BP check.   Her updated medication list for this problem includes:    Lisinopril 20 Mg Tabs (Lisinopril) ..... On hold re: cough    Hydrochlorothiazide 25 Mg Tabs (Hydrochlorothiazide) ..Marland Kitchen.. Take 1 tablet by mouth once a day.    Norvasc 10 Mg Tabs (Amlodipine besylate) ..Marland Kitchen.. Take 1  tablet by mouth once a day    Carvedilol 25 Mg Tabs (Carvedilol) ..Marland Kitchen.. Take 1 tablet by mouth two times a day  BP today: 151/88 Prior BP: 156/94 (08/24/2009)  Labs Reviewed: K+: 4.0 (08/23/2009) Creat: : 0.79 (08/23/2009)   Chol: 217 (08/23/2009)   HDL: 57 (08/23/2009)   LDL: 135 (08/23/2009)   TG: 125 (08/23/2009)  Problem # 2:  DIABETES MELLITUS, TYPE II (ICD-250.00) HbA1c is unchanged.  Will not make any changes to her current med regimen.  Encouraged pt to continue with attempts at healthy eating and exercise.   Her updated medication list for this problem includes:    Lisinopril 20 Mg Tabs (Lisinopril) ..... On hold re: cough    Glipizide 10 Mg Tabs (Glipizide) ..Marland Kitchen.. Take 1 tablet by mouth twice a day.    Glucophage  1000 Mg Tabs (Metformin hcl) .Marland Kitchen... Take one tablet by mouth two times a day.    Aspirin 81 Mg Tabs (Aspirin) .Marland Kitchen... Take 1 tablet by mouth once a day.  Orders: T- Capillary Blood Glucose (16109) T-Hgb A1C (in-house) (60454UJ)  Labs Reviewed: Creat: 0.79 (08/23/2009)    Reviewed HgBA1c results: 7.8 (11/30/2009)  7.7 (08/24/2009)  Problem # 3:  MORBID OBESITY (ICD-278.01) Encouraged pt to continue with her exercise plan and attempts at a healthier diet.  Her goal is to walk for 15-50mn three times a week.  Stressed the importance of exercise, weight loss, and healthy dietary habits.  Will f/u at her next visit.  Ht: 65 (11/30/2009)   Wt: 295.6 (11/30/2009)   BMI: 49.37 (11/30/2009)  Complete Medication List: 1)  Lisinopril 20 Mg Tabs (Lisinopril) .... On hold re: cough 2)  Hydrochlorothiazide 25 Mg Tabs (Hydrochlorothiazide) .... Take 1 tablet by mouth once a day. 3)  Glipizide 10 Mg Tabs (Glipizide) .... Take 1 tablet by mouth twice a day. 4)  Glucophage 1000 Mg Tabs (Metformin hcl) .... Take one tablet by mouth two times a day. 5)  Womens Multivitamin Plus Tabs (Multiple vitamins-minerals) .... Take 1 tablet by mouth once a day 6)  Aspirin 81 Mg Tabs (Aspirin)  .... Take 1 tablet by mouth once a day. 7)  Norvasc 10 Mg Tabs (Amlodipine besylate) .... Take 1 tablet by mouth once a day 8)  Carvedilol 25 Mg Tabs (Carvedilol) .... Take 1 tablet by mouth two times a day 9)  Flexeril 5 Mg Tabs (Cyclobenzaprine hcl) .... Take one tablet by mouth at bedtime for muscle pain/spasm 10)  Claritin 10 Mg Tabs (Loratadine) .... Take 1 tablet by mouth once a day  Patient Instructions: 1)  Please schedule a follow-up appointment in 1-2  months with Dr. MJerelene Redden to check your blood pressure. 2)  Keep walking for 15-2531m a day, 3 times a week. 3)  Take your remaining CARVEDILOL 12.31m59mills as follows: 1 pill in the morning and 2 at night, until all of the pills are gone. 4)  Then use your new prescription of CARVEDILOL 231m41mbs as directed. 5)  [facility]  Buy over the counter GAS-X (also called simethicone) to help with your bloating.  Use as directed. 7)  Keep up all the good work with your exercise!  Prevention & Chronic Care Immunizations   Influenza vaccine: Fluvax Non-MCR  (03/14/2008)   Influenza vaccine due: 03/14/2009    Tetanus booster: Not documented    Pneumococcal vaccine: Pneumovax  (03/14/2008)   Pneumococcal vaccine due: None    H. zoster vaccine: Not documented   H. zoster vaccine deferral: Deferred  (04/24/2009)  Colorectal Screening   Hemoccult: normal  (03/21/2008)   Hemoccult due: 03/21/2009    Colonoscopy: Not documented   Colonoscopy action/deferral: GI referral  (04/24/2009)  Other Screening   Pap smear: Not documented    Mammogram: No specific mammographic evidence of malignancy.  No significant changes compared to previous study.  Assessment: BIRADS 1. [facility]: [facility]    (04/18/2008)   Mammogram action/deferral: Ordered  (05/18/2009)   Mammogram due: 05/2009    DXA bone density scan: Not documented   Smoking status: never   (11/30/2009)  Diabetes Mellitus   HgbA1C: 7.8  (11/30/2009)   HgbA1C action/deferral: Ordered  (01/18/2009)   Hemoglobin A1C due: 02/12/2008    Eye exam: Not documented   Diabetic  eye exam action/deferral: Ophthalmology referral  (05/18/2009)    Foot exam: yes  (10/24/2008)   Foot exam action/deferral: Do today   High risk foot: No  (11/30/2009)   Foot care education: Done  (11/30/2009)    Urine microalbumin/creatinine ratio: 18.4  (04/25/2009)   Urine microalbumin action/deferral: Ordered    Diabetes flowsheet reviewed?: Yes  Lipids   Total Cholesterol: 217  (08/23/2009)   Lipid panel action/deferral: Deferred   LDL: 135  (08/23/2009)   LDL Direct: Not documented   HDL: 57  (08/23/2009)   Triglycerides: 125  (08/23/2009)    SGOT (AST): 19  (08/23/2009)   SGPT (ALT): 25  (08/23/2009)   Alkaline phosphatase: 67  (08/23/2009)   Total bilirubin: 0.5  (08/23/2009)    Lipid flowsheet reviewed?: Yes   Progress toward LDL goal: Unchanged  Hypertension   Last Blood Pressure: 151 / 88  (11/30/2009)   Serum creatinine: 0.79  (08/23/2009)   Serum potassium 4.0  (08/23/2009)    Hypertension flowsheet reviewed?: Yes   Progress toward BP goal: Unchanged  Self-Management Support :   Personal Goals (by the next clinic visit) :     Personal A1C goal: 7  (05/18/2009)     Personal blood pressure goal: 130/80  (05/18/2009)     Personal LDL goal: 100  (05/18/2009)    Patient will work on the following items until the next clinic visit to reach self-care goals:     Medications and monitoring: take my medicines every day, bring all of my medications to every visit, examine my feet every day  (11/30/2009)     Eating: eat more vegetables, use fresh or frozen vegetables, eat foods that are low in salt, eat baked foods instead of fried foods  (11/30/2009)     Activity: take a 30 minute walk every day  (08/24/2009)     Other: NO METER  (04/24/2009)    Diabetes self-management support:  Written self-care plan  (11/30/2009)   Diabetes care plan printed    Hypertension self-management support: Written self-care plan  (11/30/2009)   Hypertension self-care plan printed.    Lipid self-management support: Written self-care plan  (11/30/2009)   Lipid self-care plan printed.   Nursing Instructions: Diabetic foot exam today     Laboratory Results   Blood Tests   Date/Time Received: November 30, 2009 4:39 PM Date/Time Reported: Maryan Rued  November 30, 2009 4:39 PM   HGBA1C: 7.8%   (Normal Range: Non-Diabetic - 3-6%   Control Diabetic - 6-8%) CBG Random:: 124m/dL

## 2010-07-04 NOTE — Progress Notes (Signed)
Summary: Refill/gh  Phone Note Refill Request Message from:  Fax from Pharmacy on February 05, 2010 3:53 PM  Refills Requested: Medication #1:  GLIPIZIDE 10 MG TABS Take 1 tablet by mouth twice a day.   Last Refilled: 01/08/2010  Medication #2:  GLUCOPHAGE 1000 MG TABS Take one tablet by mouth two times a day. Message from pharmacy.  Only have # 60 left on prescription for next refill in October.  Have been giving pt 90 day supplies.   Method Requested: Fax to [facility] Initial call taken by: Sander Nephew RN,  February 05, 2010 3:54 PM    Prescriptions: GLUCOPHAGE 1000 MG TABS (METFORMIN HCL) Take one tablet by mouth two times a day.  #180 x 5   Entered and Authorized by:   Eduardo Osier DO   Signed by:   Eduardo Osier DO on 02/06/2010   Method used:   Faxed to ...       [facility] (retail)       [address] [employer]       [place], [place]  [address]       Ph: [phone]       Fax: [phone]   RxID:   [id number] GLIPIZIDE 10 MG TABS (GLIPIZIDE) Take 1 tablet by mouth twice a day.  #180 x 6   Entered and Authorized by:   Eduardo Osier DO   Signed by:   Eduardo Osier DO on 02/06/2010   Method used:   Faxed to ...       [facility] (retail)       [address] [place]       [place], [place]  [address]       Ph: [phone]       Fax: [phone]   RxID:   [id number]   Appended Document: Refill/gh Glucophage was called into [facility] at pt's request.

## 2010-07-04 NOTE — Progress Notes (Signed)
Summary: Refill/gh  Phone Note Refill Request Message from:  Fax from Pharmacy on September 18, 2009 2:26 PM  Refills Requested: Medication #1:  GLIPIZIDE 10 MG TABS Take 1 tablet by mouth twice a day.   Last Refilled: 06/11/2009  Method Requested: Fax to [facility] Initial call taken by: Sander Nephew RN,  September 18, 2009 2:27 PM  Follow-up for Phone Call        Rx faxed to pharmacy Follow-up by: Eduardo Osier DO,  September 18, 2009 2:59 PM    Prescriptions: GLIPIZIDE 10 MG TABS (GLIPIZIDE) Take 1 tablet by mouth twice a day.  #60 x 6   Entered and Authorized by:   Eduardo Osier DO   Signed by:   Eduardo Osier DO on 09/18/2009   Method used:   Faxed to ...       [facility] (retail)       [address] [place]       [place], [place]  [address]       Ph: [phone]       Fax: [phone]   RxID:   [id number]

## 2010-07-04 NOTE — Assessment & Plan Note (Signed)
Summary: cold/BPcheck/gg   Vital Signs:  Patient profile:   60 year old female Height:      65 inches (165.10 cm) Weight:      294.2 pounds (133.36 kg) BMI:     49.13 Temp:     97.9 degrees F (36.61 degrees C) oral Pulse rate:   83 / minute BP sitting:   147 / 92  (right arm) Cuff size:   regular  Vitals Entered By: Lucky Rathke NT II (August 03, 2009 3:29 PM) CC: HEADACHE -  COUGH - CONGESTION - RUNNY NOSE  - RUNNYNOSE - DRY MOUTH  SINCE SUNDAY NIGHT,  Is Patient Diabetic? Yes Did you bring your meter with you today? No Nutritional Status BMI of > 30 = obese  Have you ever been in a relationship where you felt threatened, hurt or afraid?No   Does patient need assistance? Functional Status Self care Ambulation Normal Comments HEADACHE - COUGH - CONGESTION - RUNNY NOSE - DRY MOUTH  SINCE SUNDAY NIGHT   Primary Care Provider:  Eduardo Osier DO  CC:  HEADACHE -  COUGH - CONGESTION - RUNNY NOSE  - RUNNYNOSE - DRY MOUTH  SINCE SUNDAY NIGHT and .  History of Present Illness: Sonya Lane is a 60 year old Female with PMH/problems as outlined in the EMR, who presents to the [facility] with chief complaint(s) of: Having cold and sinus pressure and coughing all night long for the past five days. No previous history of similar problems. No travel or house hold sick contacts. Has fevers (subjective), has sweating and chills. No allergies. No NVD. Bowel and bladder fx okay.      Depression History:      The patient denies a depressed mood most of the day and a diminished interest in her usual daily activities.         Preventive Screening-Counseling & Management  Alcohol-Tobacco     Smoking Status: never  Caffeine-Diet-Exercise     Does Patient Exercise: no     Type of exercise: WATER AROBIC     Times/week: 7  Allergies: 1)  ! Zocor 2)  ! Pravachol   Impression & Recommendations:  Problem # 1:  OTHER SCREENING MAMMOGRAM (ICD-V76.12) Exam and history consistent with  sinusitis / URI. Will treat with amoxicillin empiric.   Problem # 2:  HYPERTENSION (ICD-401.9) High BP noted but patient is in significant discomfort due to the cold. Won't make any changes today.  Her updated medication list for this problem includes:    Lisinopril 20 Mg Tabs (Lisinopril) ..... On hold re: cough    Hydrochlorothiazide 25 Mg Tabs (Hydrochlorothiazide) .Marland Kitchen... Take 1 tablet by mouth once a day.    Norvasc 10 Mg Tabs (Amlodipine besylate) .Marland Kitchen... Take 1 tablet by mouth once a day    Carvedilol 12.5 Mg Tabs (Carvedilol) .Marland Kitchen... Take 1 pill by mouth two times a day  Problem # 3:  DIABETES MELLITUS, TYPE II (ICD-250.00) A1c: 7.0 (04/24/2009 1:26:48 PM)  MICROALB/CR: 18.4 (04/25/2009 1:42:00 AM) LDL: 123 (05/08/2009 6:19:00 PM) EYE: pending FOOT: yes (10/24/2008 1:38:20 PM) Data reviewed. Patient will come back for a repeat A1c. no changes today.  Her updated medication list for this problem includes:    Lisinopril 20 Mg Tabs (Lisinopril) ..... On hold re: cough    Glipizide 10 Mg Tabs (Glipizide) .Marland Kitchen... Take 1 tablet by mouth twice a day.    Glucophage 1000 Mg Tabs (Metformin hcl) .Marland Kitchen... Take one tablet by mouth two times a day.  Aspirin 81 Mg Tabs (Aspirin) .Marland Kitchen... Take 1 tablet by mouth once a day.  Problem # 4:  HYPERLIPIDEMIA (BDZ-329.4) Chol: 211 (05/08/2009 6:19:00 PM)HDL:  53 (05/08/2009 6:19:00 PM)LDL:  123 (05/08/2009 6:19:00 PM)Tri:   AST:  16 (05/08/2009 6:19:00 PM) ALT:  22 (05/08/2009 6:19:00 PM)T. Bili:  0.4 (05/08/2009 6:19:00 PM) AP:  59 (05/08/2009 6:19:00 PM)  Data reviewed. High LDL noted on previous exam. Patient will come back for a repeat FLP. Will need to start on statin after that.  Future Orders: T-Lipid Profile [id number]) ... 08/06/2009 T-Comprehensive Metabolic Panel ([id number]) ... 08/06/2009  Complete Medication List: 1)  Lisinopril 20 Mg Tabs (Lisinopril) .... On hold re: cough 2)  Hydrochlorothiazide 25 Mg Tabs (Hydrochlorothiazide) .... Take 1  tablet by mouth once a day. 3)  Glipizide 10 Mg Tabs (Glipizide) .... Take 1 tablet by mouth twice a day. 4)  Glucophage 1000 Mg Tabs (Metformin hcl) .... Take one tablet by mouth two times a day. 5)  Womens Multivitamin Plus Tabs (Multiple vitamins-minerals) .... Take 1 tablet by mouth once a day 6)  Aspirin 81 Mg Tabs (Aspirin) .... Take 1 tablet by mouth once a day. 7)  Norvasc 10 Mg Tabs (Amlodipine besylate) .... Take 1 tablet by mouth once a day 8)  Carvedilol 12.5 Mg Tabs (Carvedilol) .... Take 1 pill by mouth two times a day 9)  Flexeril 5 Mg Tabs (Cyclobenzaprine hcl) .... Take one tablet by mouth at bedtime for muscle pain/spasm 10)  Amoxicillin 500 Mg Tabs (Amoxicillin) .... Take 1 tablet by mouth three times a day for seven days 11)  Claritin 10 Mg Tabs (Loratadine) .... Take 1 tablet by mouth once a day  Patient Instructions: 1)  Please come back for blood work after overnight fasting. 2)  Do let us know if your problem worsens.   3)  Please schedule a follow-up appointment in 3 months. Prescriptions: CLARITIN 10 MG TABS (LORATADINE) Take 1 tablet by mouth once a day  #7 x 1   Entered and Authorized by:   Dawna Part MD   Signed by:   Dawna Part MD on 08/03/2009   Method used:   Electronically to        [id number]* (retail)       [place], [place]  [address]       Ph: [phone]       Fax: [phone]   RxID:   [id number] AMOXICILLIN 500 MG TABS (AMOXICILLIN) Take 1 tablet by mouth three times a day for seven days  #21 x 0   Entered and Authorized by:   Dawna Part MD   Signed by:   Dawna Part MD on 08/03/2009   Method used:   Electronically to        [facility] [phone]* (retail)       [address]       [place], [place]  [address]       Ph: [phone]       Fax: [phone]   RxID:   [id number]  Process Orders Check Orders Results:     [facility]: CBS not required for this  insurance Tests Sent for requisitioning (August 04, 2009 4:40 PM):     08/06/2009: [facility] -- T-Lipid Profile [id number] (signed)     08/06/2009: [facility] -- T-Comprehensive Metabolic Panel [[id number]] (signed)    Prevention & Chronic Care Immunizations  Influenza vaccine: Fluvax Non-MCR  (03/14/2008)   Influenza vaccine due: 03/14/2009    Tetanus booster: Not documented    Pneumococcal vaccine: Pneumovax  (03/14/2008)   Pneumococcal vaccine due: None    H. zoster vaccine: Not documented   H. zoster vaccine deferral: Deferred  (04/24/2009)  Colorectal Screening   Hemoccult: normal  (03/21/2008)   Hemoccult due: 03/21/2009    Colonoscopy: Not documented   Colonoscopy action/deferral: GI referral  (04/24/2009)  Other Screening   Pap smear: Not documented    Mammogram: No specific mammographic evidence of malignancy.  No significant changes compared to previous study.  Assessment: BIRADS 1. Location: [facility].    (04/18/2008)   Mammogram action/deferral: Ordered  (05/18/2009)   Mammogram due: 05/2009    DXA bone density scan: Not documented   Smoking status: never  (08/03/2009)  Diabetes Mellitus   HgbA1C: 7.0  (04/24/2009)   HgbA1C action/deferral: Ordered  (01/18/2009)   Hemoglobin A1C due: 02/12/2008    Eye exam: Not documented   Diabetic eye exam action/deferral: Ophthalmology referral  (05/18/2009)    Foot exam: yes  (10/24/2008)   High risk foot: Not documented   Foot care education: Not documented    Urine microalbumin/creatinine ratio: 18.4  (04/25/2009)   Urine microalbumin action/deferral: Ordered    Diabetes flowsheet reviewed?: Yes   Progress toward A1C goal: At goal  Lipids   Total Cholesterol: 211  (05/08/2009)   LDL: 123  (05/08/2009)   LDL Direct: Not documented   HDL: 53  (05/08/2009)   Triglycerides: 175  (05/08/2009)    SGOT (AST): 16  (05/08/2009)   SGPT  (ALT): 22  (05/08/2009) CMP ordered    Alkaline phosphatase: 59  (05/08/2009)   Total bilirubin: 0.4  (05/08/2009)    Lipid flowsheet reviewed?: Yes   Progress toward LDL goal: Unchanged  Hypertension   Last Blood Pressure: 147 / 92  (08/03/2009)   Serum creatinine: 0.80  (05/08/2009)   Serum potassium 3.9  (05/08/2009) CMP ordered     Hypertension flowsheet reviewed?: Yes   Progress toward BP goal: Improved  Self-Management Support :   Personal Goals (by the next clinic visit) :     Personal A1C goal: 7  (05/18/2009)     Personal blood pressure goal: 130/80  (05/18/2009)     Personal LDL goal: 100  (05/18/2009)    Patient will work on the following items until the next clinic visit to reach self-care goals:     Medications and monitoring: take my medicines every day, bring all of my medications to every visit  (08/03/2009)     Eating: drink diet soda or water instead of juice or soda, eat more vegetables, use fresh or frozen vegetables, eat foods that are low in salt, eat baked foods instead of fried foods, eat fruit for snacks and desserts, limit or avoid alcohol  (08/03/2009)     Activity: take a 30 minute walk every day  (08/03/2009)     Other: NO METER  (04/24/2009)    Diabetes self-management support: Resources for patients handout  (08/03/2009)    Hypertension self-management support: Resources for patients handout  (08/03/2009)    Lipid self-management support: Resources for patients handout  (08/03/2009)        Resource handout printed.   Process Orders Check Orders Results:     [facility]: MPN not required for this insurance Tests Sent for requisitioning (August 04, 2009 4:40 PM):     08/06/2009:  [facility] -- T-Lipid Profile [id number] (signed)     08/06/2009: [facility] -- T-Comprehensive Metabolic Panel [[id number]] (signed)

## 2010-07-04 NOTE — Procedures (Signed)
Summary: [facility]   Imported By: Garlan Fillers 09/06/2009 09:40:59  _____________________________________________________________________  External Attachment:    Type:   Image     Comment:   External Document

## 2010-07-04 NOTE — Assessment & Plan Note (Signed)
Summary: CHECKUP/SB.   Vital Signs:  Patient profile:   60 year old female Height:      65 inches Weight:      295.0 pounds BMI:     49.27 Temp:     98.6 degrees F oral Pulse rate:   86 / minute BP sitting:   156 / 94  (right arm)  Vitals Entered By: Silverio Decamp NT II (August 24, 2009 1:39 PM) CC: check-up lab results Is Patient Diabetic? Yes Did you bring your meter with you today? checkupNo Pain Assessment Patient in pain? no      Nutritional Status BMI of > 30 = obese CBG Result 173  Have you ever been in a relationship where you felt threatened, hurt or afraid?No   Does patient need assistance? Functional Status Self care Ambulation Normal   Primary Care Provider:  Eduardo Osier DO  CC:  check-up lab results.  History of Present Illness: Sonya Lane is a 60 yo woman who is in today for follow up of her multiple medical problems.  1. DM - Taking meds as directed. No signs or symptoms of hypoglycemia.  2. HTN - Taking meds as directed.  Cough resolved after D/C of lisinopril, however BP significantly elevated today.  Pt would prefer to not re-start ace.  Denies HA, visual changes, dizziness, syncope,C/P, SOB, and DOE. 3. Hyperlipidemia - Still refusing statin.  Pt trying to increase dietary fiber intake and eat healthier. 4. Obesity - Trying to lose weight. Walking 3x a week.  5. Medication assistance program - pt now filling prescriptions at [facility].  Preventive Screening-Counseling & Management  Alcohol-Tobacco     Smoking Status: never  Caffeine-Diet-Exercise     Does Patient Exercise: no     Type of exercise: WATER AROBIC     Times/week: 7  Current Medications (verified): 1)  Lisinopril 20 Mg  Tabs (Lisinopril) .... On Hold Re: Cough 2)  Hydrochlorothiazide 25 Mg Tabs (Hydrochlorothiazide) .... Take 1 Tablet By Mouth Once A Day. 3)  Glipizide 10 Mg Tabs (Glipizide) .... Take 1 Tablet By Mouth Twice A Day. 4)  Glucophage 1000 Mg Tabs (Metformin Hcl) ....  Take One Tablet By Mouth Two Times A Day. 5)  Womens Multivitamin Plus  Tabs (Multiple Vitamins-Minerals) .... Take 1 Tablet By Mouth Once A Day 6)  Aspirin 81 Mg Tabs (Aspirin) .... Take 1 Tablet By Mouth Once A Day. 7)  Norvasc 10 Mg  Tabs (Amlodipine Besylate) .... Take 1 Tablet By Mouth Once A Day 8)  Carvedilol 12.5 Mg Tabs (Carvedilol) .... Take 1 Pill By Mouth Two Times A Day 9)  Flexeril 5 Mg Tabs (Cyclobenzaprine Hcl) .... Take One Tablet By Mouth At Bedtime For Muscle Pain/spasm 10)  Amoxicillin 500 Mg Tabs (Amoxicillin) .... Take 1 Tablet By Mouth Three Times A Day For Seven Days 11)  Claritin 10 Mg Tabs (Loratadine) .... Take 1 Tablet By Mouth Once A Day 12)  Fluconazole 150 Mg Tabs (Fluconazole) .... One By Mouth Times One  Allergies (verified): 1)  ! Zocor 2)  ! Pravachol  Past History:  Past medical, surgical, family and social histories (including risk factors) reviewed, and no changes noted (except as noted below).  Past Medical History: Reviewed history from 07/15/2007 and no changes required. Diabetes mellitus, type II- dx'd 2001 -  Eye exam 2006 -  Nl M/C ratio 7/05 Hyperlipidemia Hypertension- no sig proteinuira 7/05 Elevated Liver enzymes on Zocor. Most recentpanel nl 3/07 -  Hep B/C neg  12/05 Human papillomavirus, vaginal warts UTI 10/04 Varicose veins, R>L leg OA- R knee tricompartmental medial/patellofemoral joint  Past Surgical History: Reviewed history from 03/17/2006 and no changes required. Caesarean section x 2, yrs unknown Hysterectomy, TAHBSOsecondry to endometrial bleeding, yr unknown  Family History: Reviewed history from 03/14/2008 and no changes required. Mother died of gyn cancer, unclear what type  Social History: Reviewed history from 04/24/2009 and no changes required. She has 5 children, 10 grandchildren.  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   Head:  normocephalic and atraumatic.   Mouth:  pharynx pink and moist and fair dentition.   Neck:  supple, full ROM, no masses, no thyroid nodules or tenderness, no JVD, no carotid bruits, and no cervical lymphadenopathy.   Lungs:  no accessory muscle use, normal breath sounds, no crackles, and no wheezes.   Heart:  normal rate, regular rhythm, no murmur, no gallop, and no rub.   Abdomen:  soft, non-tender, normal bowel sounds, no distention, no masses, and no guarding.   Extremities:  No clubbing, cyanosis, or edema. Neurologic:  alert & oriented X3, cranial nerves II-XII intact, strength normal in all extremities, sensation intact to light touch, and DTRs symmetrical and normal.     Impression & Recommendations:  Problem # 1:  HYPERTENSION (ICD-401.9) Sonya. Hurlbut's blood pressure is slightly elevated today and has remained above goal at her last few appt.  She does not want to restart treatment with and ACE 2/2 cough and is unable to afford an ARB.  SHe understands the need for good BP control and agrees to increase of her Carvedilol; will increased from 12.5 two times a day to 20m two times a day.  Reviewed signs and symptoms of hypertension and hypotension with pt.  Will bring her back in one month for BP check.  Her updated medication list for this problem includes:    Lisinopril 20 Mg Tabs (Lisinopril) ..... On hold re: cough    Hydrochlorothiazide 25 Mg Tabs (Hydrochlorothiazide) ..Marland Kitchen.. Take 1 tablet by mouth once a day.    Norvasc 10 Mg Tabs (Amlodipine besylate) ..Marland Kitchen.. Take 1 tablet by mouth once a day    Carvedilol 12.5 Mg Tabs (Carvedilol) ..Marland Kitchen.. Take 1 pill by mouth two times a day  BP today: 156/94 Prior BP: 147/92 (08/03/2009)  Labs Reviewed: K+: 3.9 (05/08/2009) Creat: : 0.80 (05/08/2009)   Chol: 211 (05/08/2009)    HDL: 53 (05/08/2009)   LDL: 123 (05/08/2009)   TG: 175 (05/08/2009)  Problem # 2:  DIABETES MELLITUS, TYPE II (ICD-250.00) Pt is doing well with her current DM2 regimen.  She is trying to incorporate more physical activity into her life to help with better control of her DM and HTN; currently trying to walk 3 times a week.  Encourage her to continue with exercise; will follow this up at her next appt.  Her updated medication list for this problem includes:    Lisinopril 20 Mg Tabs (Lisinopril) ..... On hold re: cough    Glipizide 10 Mg Tabs (Glipizide) ..Marland Kitchen.. Take 1 tablet by mouth twice a day.    Glucophage 1000 Mg Tabs (Metformin hcl) ..Marland Kitchen.. Take one tablet by mouth two times a day.    Aspirin 81 Mg Tabs (Aspirin) ..Marland Kitchen.. Take 1 tablet by mouth once a day.  Orders: T- Capillary Blood Glucose ([id number]) T-Hgb A1C (in-house) ([id number])  Labs Reviewed: Creat: 0.80 (05/08/2009)    Reviewed HgBA1c results: 7.7 (08/24/2009)  7.0 (04/24/2009)  Problem # 3:  HYPERLIPIDEMIA (ICD-272.4)  Pt still refuses statin therapy; review of her last FLP in 12/10 reveals she is above goal but not horribly so.  She is interesting in keeping her cholesterol low and is interested in making dietary changes to help with this.  Ecnouraged her to continue with a healthy diet high in vegetable and fiber content and to avoid fatty foods.  Will repeat FLP in one year.  Labs Reviewed: SGOT: 16 (05/08/2009)   SGPT: 22 (05/08/2009)   HDL:53 (05/08/2009), 53 (08/04/2008)  LDL:123 (05/08/2009), 112 (08/04/2008)  Chol:211 (05/08/2009), 186 (08/04/2008)  Trig:175 (05/08/2009), 107 (08/04/2008)  Problem # 4:  COUGH (ICD-786.2) Pt c/o mild cough and runny nose c/w her seasonal allergies.  Advised her to try OTC non-sedating antihistamine (Alavert) for symptomatic relief.    Complete Medication List: 1)  Lisinopril 20 Mg Tabs (Lisinopril) .... On hold re: cough 2)  Hydrochlorothiazide 25 Mg Tabs (Hydrochlorothiazide) .... Take 1  tablet by mouth once a day. 3)  Glipizide 10 Mg Tabs (Glipizide) .... Take 1 tablet by mouth twice a day. 4)  Glucophage 1000 Mg Tabs (Metformin hcl) .... Take one tablet by mouth two times a day. 5)  Womens Multivitamin Plus Tabs (Multiple vitamins-minerals) .... Take 1 tablet by mouth once a day 6)  Aspirin 81 Mg Tabs (Aspirin) .... Take 1 tablet by mouth once a day. 7)  Norvasc 10 Mg Tabs (Amlodipine besylate) .... Take 1 tablet by mouth once a day 8)  Carvedilol 12.5 Mg Tabs (Carvedilol) .... Take 1 pill by mouth two times a day 9)  Flexeril 5 Mg Tabs (Cyclobenzaprine hcl) .... Take one tablet by mouth at bedtime for muscle pain/spasm 10)  Amoxicillin 500 Mg Tabs (Amoxicillin) .... Take 1 tablet by mouth three times a day for seven days 11)  Claritin 10 Mg Tabs (Loratadine) .... Take 1 tablet by mouth once a day 12)  Fluconazole 150 Mg Tabs (Fluconazole) .... One by mouth times one  Patient Instructions: 1)  Please schedule a follow-up appointment in 1 month for a blood pressure check. 2)  Start taking 2 of your carvedilol pills every morning and 1 pill at night for 3-4 days.  After 3-4 days, increase to 2 pills in the morning and 2 at night. 3)  A new prescription for the increased dose of your carvedilol will be sent to your pharmacy. 4)  Try over the counter Alavert for relief of your runny nose and congestion. 5)  Keep trying to walk at least 3 times a week!    Prevention & Chronic Care Immunizations   Influenza vaccine: Fluvax Non-MCR  (03/14/2008)   Influenza vaccine due: 03/14/2009    Tetanus booster: Not documented    Pneumococcal vaccine: Pneumovax  (03/14/2008)   Pneumococcal vaccine due: None    H. zoster vaccine: Not documented   H. zoster vaccine deferral: Deferred  (04/24/2009)  Colorectal Screening   Hemoccult: normal  (03/21/2008)   Hemoccult due: 03/21/2009    Colonoscopy: Not documented   Colonoscopy action/deferral: GI referral  (04/24/2009)  Other  Screening   Pap smear: Not documented    Mammogram: No specific mammographic evidence of malignancy.  No significant changes compared to previous study.  Assessment: BIRADS 1. Location: [facility].    (04/18/2008)   Mammogram action/deferral:  Ordered  (05/18/2009)   Mammogram due: 05/2009    DXA bone density scan: Not documented  Reports requested:  Smoking status: never  (08/24/2009)  Diabetes Mellitus   HgbA1C: 7.7  (08/24/2009)   HgbA1C action/deferral: Ordered  (01/18/2009)   Hemoglobin A1C due: 02/12/2008    Eye exam: Not documented   Diabetic eye exam action/deferral: Ophthalmology referral  (05/18/2009)    Foot exam: yes  (10/24/2008)   High risk foot: Not documented   Foot care education: Not documented    Urine microalbumin/creatinine ratio: 18.4  (04/25/2009)   Urine microalbumin action/deferral: Ordered  Lipids   Total Cholesterol: 211  (05/08/2009)   LDL: 123  (05/08/2009)   LDL Direct: Not documented   HDL: 53  (05/08/2009)   Triglycerides: 175  (05/08/2009)    SGOT (AST): 16  (05/08/2009)   SGPT (ALT): 22  (05/08/2009)   Alkaline phosphatase: 59  (05/08/2009)   Total bilirubin: 0.4  (05/08/2009)  Hypertension   Last Blood Pressure: 156 / 94  (08/24/2009)   Serum creatinine: 0.80  (05/08/2009)   Serum potassium 3.9  (05/08/2009)  Self-Management Support :   Personal Goals (by the next clinic visit) :     Personal A1C goal: 7  (05/18/2009)     Personal blood pressure goal: 130/80  (05/18/2009)     Personal LDL goal: 100  (05/18/2009)    Patient will work on the following items until the next clinic visit to reach self-care goals:     Medications and monitoring: take my medicines every day  (08/24/2009)     Eating: drink diet soda or water instead of juice or soda, eat more vegetables, use fresh or frozen vegetables, eat foods that are low in salt, eat baked foods instead of fried foods, eat fruit for snacks and  desserts, limit or avoid alcohol  (08/24/2009)     Activity: take a 30 minute walk every day  (08/24/2009)     Other: NO METER  (04/24/2009)    Diabetes self-management support: Education handout, Resources for patients handout  (08/24/2009)   Diabetes education handout printed    Hypertension self-management support: BP self-monitoring log, Education handout, Resources for patients handout, Written self-care plan  (08/24/2009)   Hypertension self-care plan printed.   Hypertension education handout printed    Lipid self-management support: Education handout, Resources for patients handout  (08/24/2009)     Lipid education handout printed      Resource handout printed.   Nursing Instructions: Request eye records from [facility]  Laboratory Results   Blood Tests   Date/Time Received: August 24, 2009 2:09 PM  Date/Time Reported: Lenoria Farrier  August 24, 2009 2:09 PM   HGBA1C: 7.7%   (Normal Range: Non-Diabetic - 3-6%   Control Diabetic - 6-8%) CBG Random:: 131m/dL

## 2010-07-04 NOTE — Progress Notes (Signed)
Summary: med refill/gp  Phone Note Refill Request Message from:  Fax from Pharmacy on January 11, 2010 12:20 PM  Refills Requested: Medication #1:  NORVASC 10 MG  TABS Take 1 tablet by mouth once a day   Last Refilled: 09/06/2009 Last appt.11/30/09.   Method Requested: Telephone to Pharmacy Initial call taken by: Morrison Old RN,  January 11, 2010 12:20 PM  Follow-up for Phone Call        Rx faxed to pharmacy Follow-up by: Eduardo Osier DO,  January 11, 2010 1:28 PM    Prescriptions: NORVASC 10 MG  TABS (AMLODIPINE BESYLATE) Take 1 tablet by mouth once a day  #90 x 3   Entered and Authorized by:   Eduardo Osier DO   Signed by:   Eduardo Osier DO on 01/11/2010   Method used:   Faxed to ...       [facility] (retail)       [address]       [place], [place]  [address]       Ph: [phone]       Fax: [phone]   RxID:   [id number]

## 2010-07-04 NOTE — Progress Notes (Signed)
Summary: Refill/gh  Phone Note Refill Request Message from:  Fax from Pharmacy  Refills Requested: Medication #1:  CARVEDILOL 12.5 MG TABS Take 1 pill by mouth two times a day   Last Refilled: 07/04/2009  Method Requested: Electronic Initial call taken by: Sander Nephew RN,  November 27, 2009 2:50 PM  Follow-up for Phone Call        Rx faxed to pharmacy Follow-up by: Eduardo Osier DO,  November 29, 2009 8:08 AM    Prescriptions: CARVEDILOL 12.5 MG TABS (CARVEDILOL) Take 1 pill by mouth two times a day  #60 x 3   Entered and Authorized by:   Eduardo Osier DO   Signed by:   Eduardo Osier DO on 11/29/2009   Method used:   Faxed to ...       [facility] (retail)       [address] [place]       [place], [place]  [address]       Ph: [phone]       Fax: [phone]   RxID:   [id number]

## 2010-07-04 NOTE — Progress Notes (Signed)
Summary: refill/gg  Phone Note Refill Request  on December 05, 2009 10:45 AM  Refills Requested: Medication #1:  CARVEDILOL 25 MG TABS were you going to change directions on the carvedilol?   Method Requested: Electronic Initial call taken by: Gevena Cotton RN,  December 05, 2009 10:45 AM  Follow-up for Phone Call        Done.  Rx with refills sent to pharmacy.  Thank you! Follow-up by: Eduardo Osier DO,  December 05, 2009 3:31 PM    New/Updated Medications: CARVEDILOL 25 MG TABS (CARVEDILOL) Take 1 tablet by mouth two times a day Prescriptions: CARVEDILOL 25 MG TABS (CARVEDILOL) Take 1 tablet by mouth two times a day  #60 x 3   Entered and Authorized by:   Eduardo Osier DO   Signed by:   Eduardo Osier DO on 12/05/2009   Method used:   Electronically to        [facility] [phone]* (retail)       [address]       [place], [place]  [address]       Ph: [phone]       Fax: [phone]   RxID:   [id number]

## 2010-07-04 NOTE — Progress Notes (Signed)
Summary: refill/gg  Phone Note Refill Request  on August 17, 2009 12:19 PM  pt finished Rx of AMOXICILLIN 500 MG TABS (AMOXICILLIN) Take 1 tablet by mouth three times a day for seven days  on 3/11 and now has a yeast infection.  she would like a Rx for fluconazole called in   Method Requested: Electronic Initial call taken by: Gevena Cotton RN,  August 17, 2009 12:19 PM  Follow-up for Phone Call        done Follow-up by: Adrian Prows MD,  August 17, 2009 12:26 PM    New/Updated Medications: FLUCONAZOLE 150 MG TABS (FLUCONAZOLE) one by mouth times one Prescriptions: FLUCONAZOLE 150 MG TABS (FLUCONAZOLE) one by mouth times one  #1 x 1   Entered and Authorized by:   Adrian Prows MD   Signed by:   Adrian Prows MD on 08/17/2009   Method used:   Electronically to        [facility] [phone]* (retail)       [address]       [place],   [address]       Ph: [phone]       Fax: [phone]   RxID:   [id number]

## 2010-07-04 NOTE — Progress Notes (Signed)
Summary: refill/gg  Phone Note Refill Request  on August 03, 2009 11:41 AM  Refills Requested: Medication #1:  GLUCOPHAGE 1000 MG TABS Take one tablet by mouth two times a day.  Medication #2:  HYDROCHLOROTHIAZIDE 25 MG TABS Take 1 tablet by mouth once a day.  Medication #3:  CARVEDILOL 12.5 MG TABS Take 1 pill by mouth two times a day  Method Requested: Electronic Initial call taken by: Gevena Cotton RN,  August 03, 2009 11:41 AM  Follow-up for Phone Call        Rx submitted to pharmacy Follow-up by: Eduardo Osier DO,  August 03, 2009 1:56 PM    Prescriptions: CARVEDILOL 12.5 MG TABS (CARVEDILOL) Take 1 pill by mouth two times a day  #60 x 3   Entered and Authorized by:   Eduardo Osier DO   Signed by:   Eduardo Osier DO on 08/03/2009   Method used:   Electronically to        [facility] [phone]* (retail)       [place], [place]  [address]       Ph: [phone]       Fax: [phone]   RxID:   [id number] GLUCOPHAGE 1000 MG TABS (METFORMIN HCL) Take one tablet by mouth two times a day.  #60 x 5   Entered and Authorized by:   Eduardo Osier DO   Signed by:   Eduardo Osier DO on 08/03/2009   Method used:   Electronically to        [facility] [phone]* (retail)       [place], [place]  [address]       Ph: [phone]       Fax: [phone]   RxID:   [id number] HYDROCHLOROTHIAZIDE 25 MG TABS (HYDROCHLOROTHIAZIDE) Take 1 tablet by mouth once a day.  #30 Each x 3   Entered and Authorized by:   Eduardo Osier DO   Signed by:   Eduardo Osier DO on 08/03/2009   Method used:   Electronically to        [facility] [phone]* (retail)       [address]       [place], [place]  [address]       Ph: [phone]       Fax: [phone]   RxID:   [id number]

## 2010-07-08 ENCOUNTER — Other Ambulatory Visit: Payer: Self-pay | Admitting: *Deleted

## 2010-07-08 NOTE — Telephone Encounter (Signed)
Last refill 11/30/09

## 2010-07-09 MED ORDER — CARVEDILOL 25 MG PO TABS: 25.0000 mg | ORAL_TABLET | Freq: Two times a day (BID) | ORAL | Status: DC

## 2010-07-09 NOTE — Telephone Encounter (Signed)
Rx faxed in.

## 2010-08-16 ENCOUNTER — Encounter: Payer: Self-pay | Admitting: Internal Medicine

## 2010-08-16 ENCOUNTER — Other Ambulatory Visit: Payer: Self-pay | Admitting: Internal Medicine

## 2010-08-16 ENCOUNTER — Ambulatory Visit (INDEPENDENT_AMBULATORY_CARE_PROVIDER_SITE_OTHER): Payer: Self-pay | Admitting: Internal Medicine

## 2010-08-16 DIAGNOSIS — E119 Type 2 diabetes mellitus without complications: Secondary | ICD-10-CM

## 2010-08-16 DIAGNOSIS — I10 Essential (primary) hypertension: Secondary | ICD-10-CM

## 2010-08-16 DIAGNOSIS — Z733 Stress, not elsewhere classified: Secondary | ICD-10-CM

## 2010-08-16 DIAGNOSIS — J309 Allergic rhinitis, unspecified: Secondary | ICD-10-CM

## 2010-08-16 DIAGNOSIS — J302 Other seasonal allergic rhinitis: Secondary | ICD-10-CM

## 2010-08-16 DIAGNOSIS — F439 Reaction to severe stress, unspecified: Secondary | ICD-10-CM | POA: Insufficient documentation

## 2010-08-16 LAB — GLUCOSE, CAPILLARY: Glucose-Capillary: 177 mg/dL — ABNORMAL HIGH (ref 70–99)

## 2010-08-16 LAB — BASIC METABOLIC PANEL
CO2: 27 mEq/L (ref 19–32)
Chloride: 98 mEq/L (ref 96–112)
Creat: 0.79 mg/dL (ref 0.40–1.20)
Potassium: 3.9 mEq/L (ref 3.5–5.3)

## 2010-08-16 MED ORDER — METFORMIN HCL 1000 MG PO TABS: 1000.0000 mg | ORAL_TABLET | Freq: Two times a day (BID) | ORAL | Status: DC

## 2010-08-16 MED ORDER — GLIPIZIDE 10 MG PO TABS: 10.0000 mg | ORAL_TABLET | Freq: Two times a day (BID) | ORAL | Status: DC

## 2010-08-16 MED ORDER — LORATADINE 10 MG PO TABS: 10.0000 mg | ORAL_TABLET | Freq: Every day | ORAL | Status: DC

## 2010-08-16 MED ORDER — CARVEDILOL 25 MG PO TABS: 25.0000 mg | ORAL_TABLET | Freq: Two times a day (BID) | ORAL | Status: DC

## 2010-08-16 MED ORDER — AMLODIPINE BESYLATE 10 MG PO TABS: 10.0000 mg | ORAL_TABLET | Freq: Every day | ORAL | Status: DC

## 2010-08-16 MED ORDER — HYDROCHLOROTHIAZIDE 25 MG PO TABS: 25.0000 mg | ORAL_TABLET | Freq: Every day | ORAL | Status: DC

## 2010-08-16 NOTE — Assessment & Plan Note (Addendum)
Blood pressure is stable to slightly above goal today. This may reflect recently missed medication doses secondary to her increased stress. Will check a basic metabolic panel today  and submit refills for patient's current medications.   Sonya Lane has a history of cough possibly related to ACE inhibitor. She is unable to afford ARB. Discussed second trial of ACE therapy as this provides benefits of renal protection in the setting of her concomitant diabetes.   Her cough may have reflected seasonal allergies or possible on treated GERD. Will readdress this issue and patient's next appointment in 3 months and consider resumption of lisinopril at that time.

## 2010-08-16 NOTE — Patient Instructions (Signed)
Please schedule a follow up appointment in 3 months with Dr. Jerelene Redden. Keep taking all of your medicine as directed. Call the clinic with any concerns or questions.

## 2010-08-16 NOTE — Progress Notes (Signed)
Subjective:    Patient ID: Sonya Lane, female    DOB: Aug 04, 1950, 60 y.o.   MRN: [id number]  HPI Sonya Lane is a pleasant 60 year old female who presents today for routine followup of her chronic medical conditions.     #1 diabetes mellitus:    Patient reports taking her metformin and Glucotrol as directed however states she has missed a few doses of her medications recently secondary to increased stress regarding her son who was recently diagnosed with lung cancer. He is preparing to undergo surgery for hip replacement as a result of metastatic bony lesions. She denies any hypoglycemic events.   #2 hypertension:   Her blood pressure appears stable however somewhat elevated above goal today. She also admits to missing a few doses of her blood pressure medications again related to her recent stress.  She states she is trying her best to remain 100% compliant with her medication regimen. She denies any headaches neurological changes , visual changes , or adverse effects of her medications.   #3 obesity :   Sonya Lane has been trying to continue her exercise regimen of 3 walks per week howevershe has not been able to keep up with her normal routine as she has been trying to help her son with his medical issues.   She states she is also not been eating is healthy she would like recently.   #4 stress : patient is understandably anxious regarding the serious medical condition of her son however feels that she has a very good support system at home and is able to talk to friends and other family members about her concerns and stress. She denies suicidal and homicidal ideation. She is not interested in medication therapy for treatment of stress or depression at this time in    Review of Systems  Constitutional: Negative for fever, chills, diaphoresis, activity change, appetite change, fatigue and unexpected weight change.  HENT: Negative for hearing loss, ear pain, congestion, facial swelling, neck  pain, neck stiffness and postnasal drip.   Eyes: Negative for pain, discharge, itching and visual disturbance.  Respiratory: Negative for apnea, cough, choking, chest tightness, shortness of breath and wheezing.   Cardiovascular: Negative for chest pain, palpitations and leg swelling.  Gastrointestinal: Negative for abdominal distention.  Genitourinary: Positive for frequency. Negative for dysuria, urgency, hematuria, flank pain, decreased urine volume, difficulty urinating and pelvic pain.  Musculoskeletal: Negative for joint swelling and gait problem.  Neurological: Negative for dizziness, tremors, syncope, speech difficulty, weakness, numbness and headaches.  Psychiatric/Behavioral: Negative for agitation.       Objective:   Physical Exam  Constitutional: She is oriented to person, place, and time. She appears well-developed and well-nourished. No distress.  HENT:  Head: Normocephalic and atraumatic.  Mouth/Throat: Oropharynx is clear and moist. No oropharyngeal exudate.  Eyes: Conjunctivae and EOM are normal. Pupils are equal, round, and reactive to light. Right eye exhibits no discharge. Left eye exhibits no discharge. No scleral icterus.  Neck: Normal range of motion. Neck supple. No JVD present. No tracheal deviation present. No thyromegaly present.  Cardiovascular: Normal rate, regular rhythm and normal heart sounds.  Exam reveals no gallop and no friction rub.   No murmur heard. Pulmonary/Chest: Effort normal and breath sounds normal. No respiratory distress. She has no wheezes. She has no rales.  Abdominal: Soft. Bowel sounds are normal. She exhibits no distension and no mass. There is no tenderness. There is no rebound and no guarding.  Musculoskeletal: Normal range of motion.  She exhibits no edema and no tenderness.  Lymphadenopathy:    She has no cervical adenopathy.  Neurological: She is alert and oriented to person, place, and time. She has normal reflexes. She displays  normal reflexes. No cranial nerve deficit.  Skin: Skin is warm and dry. No rash noted. She is not diaphoretic. No erythema. No pallor.  Psychiatric: Her behavior is normal. Judgment and thought content normal.       Pt is occasionally tearful when discussing her son's cancer.            Assessment & Plan:

## 2010-08-16 NOTE — Assessment & Plan Note (Signed)
Ms. Salamone reports recent stress of her her son's diagnosis of lung cancer with bony metastatic disease.   She feels she has a good support system at home and is able to talk to family and friends about her stress level.   She denies suicidal or homicidal ideation  and does not feel she is depressed.   She is not interested in any medications to help with her mood or anxiety. Counseled the patient to call the clinic if she feels overwhelmed by her stress or is interested in considering medical therapy.

## 2010-08-16 NOTE — Assessment & Plan Note (Addendum)
Patient's hemoglobin A1c today is 7.8. This is slightly above her goal of 7.   Will dictating her current regimen of metformin and glipizide and well estimated refills for these medications today.   Reviewed the importance of healthy dietary habits and exercise.   Will perform annual foot exam today as well as urine microalbumin creatinine ratio.

## 2010-08-18 LAB — GLUCOSE, CAPILLARY: Glucose-Capillary: 156 mg/dL — ABNORMAL HIGH (ref 70–99)

## 2010-09-04 LAB — GLUCOSE, CAPILLARY: Glucose-Capillary: 106 mg/dL — ABNORMAL HIGH (ref 70–99)

## 2010-09-07 LAB — GLUCOSE, CAPILLARY: Glucose-Capillary: 199 mg/dL — ABNORMAL HIGH (ref 70–99)

## 2010-09-10 ENCOUNTER — Telehealth: Payer: Self-pay | Admitting: *Deleted

## 2010-09-10 NOTE — Telephone Encounter (Signed)
Received a call from [facility] asking for clarification on pts carvedilol. They have 2 Rx's, one  for carvedilol 12.5 mg and one for 25 mg. Your note states you would do bmet and then order. It looks like you want carvedilol 25 mg one twice daily. Is this what you would like?

## 2010-09-10 NOTE — Telephone Encounter (Signed)
Yes.  She should be on carvedilol 5m one tab bid.  Thank you!

## 2010-09-11 NOTE — Telephone Encounter (Signed)
Pharmacy informed.

## 2010-11-04 ENCOUNTER — Ambulatory Visit: Payer: Self-pay | Admitting: Internal Medicine

## 2010-11-12 ENCOUNTER — Encounter: Payer: Self-pay | Admitting: Internal Medicine

## 2010-11-13 ENCOUNTER — Encounter: Payer: Self-pay | Admitting: Internal Medicine

## 2010-11-13 ENCOUNTER — Ambulatory Visit (INDEPENDENT_AMBULATORY_CARE_PROVIDER_SITE_OTHER): Payer: Self-pay | Admitting: Internal Medicine

## 2010-11-13 VITALS — BP 139/87 | HR 78 | Temp 97.0°F | Ht 65.0 in | Wt 292.0 lb

## 2010-11-13 DIAGNOSIS — K219 Gastro-esophageal reflux disease without esophagitis: Secondary | ICD-10-CM

## 2010-11-13 DIAGNOSIS — R05 Cough: Secondary | ICD-10-CM

## 2010-11-13 DIAGNOSIS — IMO0001 Reserved for inherently not codable concepts without codable children: Secondary | ICD-10-CM

## 2010-11-13 DIAGNOSIS — R0789 Other chest pain: Secondary | ICD-10-CM

## 2010-11-13 DIAGNOSIS — E119 Type 2 diabetes mellitus without complications: Secondary | ICD-10-CM

## 2010-11-13 DIAGNOSIS — I10 Essential (primary) hypertension: Secondary | ICD-10-CM

## 2010-11-13 DIAGNOSIS — E785 Hyperlipidemia, unspecified: Secondary | ICD-10-CM

## 2010-11-13 DIAGNOSIS — R059 Cough, unspecified: Secondary | ICD-10-CM

## 2010-11-13 DIAGNOSIS — R071 Chest pain on breathing: Secondary | ICD-10-CM

## 2010-11-13 LAB — POCT GLYCOSYLATED HEMOGLOBIN (HGB A1C): Hemoglobin A1C: 7.2

## 2010-11-13 LAB — GLUCOSE, CAPILLARY: Glucose-Capillary: 190 mg/dL — ABNORMAL HIGH (ref 70–99)

## 2010-11-13 MED ORDER — LISINOPRIL 20 MG PO TABS: 20.0000 mg | ORAL_TABLET | Freq: Every day | ORAL | Status: DC

## 2010-11-13 MED ORDER — CARVEDILOL 25 MG PO TABS: 25.0000 mg | ORAL_TABLET | Freq: Two times a day (BID) | ORAL | Status: DC

## 2010-11-13 MED ORDER — RANITIDINE HCL 150 MG PO TABS: 150.0000 mg | ORAL_TABLET | Freq: Two times a day (BID) | ORAL | Status: DC

## 2010-11-13 MED ORDER — METFORMIN HCL 1000 MG PO TABS: 1000.0000 mg | ORAL_TABLET | Freq: Two times a day (BID) | ORAL | Status: DC

## 2010-11-13 MED ORDER — HYDROCHLOROTHIAZIDE 25 MG PO TABS: 25.0000 mg | ORAL_TABLET | Freq: Every day | ORAL | Status: DC

## 2010-11-13 NOTE — Assessment & Plan Note (Addendum)
Sonya Lane reports 100% compliance with her diabetic medications. She is up to date on her annual urine microalbumin creatinine; this was last checked in March of 2012 and was elevated.  We will resume ACE inhibitor therapy with lisinopril today.  Hemoglobin A1c is at goal at 7.2.  We will not make any changes to her current regimen. SHe is due for an annual diabetic eye exam this summer, we will refer her for this today.

## 2010-11-13 NOTE — Progress Notes (Signed)
  Subjective:    Patient ID: Sonya Lane, female    DOB: January 20, 1951, 60 y.o.   MRN: [id number]  HPI    Review of Systems     Objective:   Physical Exam        Assessment & Plan:

## 2010-11-13 NOTE — Patient Instructions (Addendum)
Schedule a followup with Dr. Jerelene Redden for the last week of June. I have available appointments on the 25th, 26, 27, and 28. We will discuss your cough in response to ranitidine at that time.  Schedule an appointment to come in for lab work. You should not have anything to eat or drink  from midnight before her blood work. Ranitidine is a medication for reflux. This may help with your cough and phlegm in the morning. Please start taking your lisinopril. This is an important medication for blood pressure and to help your kidneys. Continue to take all of your other medications as directed. Whenever you can, do not wear a bra. This will help alleviate some of the discomfort under your right ribs. Take over-the-counter ibuprofen as needed for your right rib pain. You may take a maximum of 3 tablets (a total of 630m) 4 times daily as needed for pain.  Costochondritis Costochondritis is a condition in which the tissue that connects your ribs with your breastbone (sternum) becomes irritated and causes chest pain. This tissue is called cartilage.  HOME CARE   Avoid activities that wear you out.   Do not strain your ribs. Avoid activities that use your:   Chest.   Belly.   Side muscles.   Use ice for 15 to 20 minutes per hour while awake for the first 2 days. Place the ice in a plastic bag. Place a towel between the bag of ice and your skin.   Only take medicine as directed by your doctor.   Only take over-the-counter or prescription medicines for pain, discomfort, or fever as directed by your caregiver.   GET HELP RIGHT AWAY IF:  Your pain gets worse.   You are very uncomfortable.   You have a fever of 102F (38.9C) or higher.   You have trouble breathing.   You cough up blood.   You start sweating or vomiting.   You develop new, unexplained symptoms.  MAKE SURE YOU:    Understand these instructions.   Will watch your condition.   Will get help right away if you are not doing  well or get worse.  Document Released: 11/05/2007 Document Re-Released: 03/16/2009 [facility] Information 2011 [place]

## 2010-11-13 NOTE — Progress Notes (Signed)
  Subjective:    Patient ID: Sonya Lane, female    DOB: 1950-06-03, 60 y.o.   MRN: [id number]  HPI  Please see the A&P for the status of the pt's chronic medical problems.  Review of Systems  Constitutional: Negative for fever, chills, diaphoresis, activity change and appetite change.  HENT: Negative for hearing loss, congestion, facial swelling, sneezing, neck pain, postnasal drip and ear discharge.   Eyes: Negative for photophobia and pain.  Respiratory: Positive for cough. Negative for apnea, chest tightness, shortness of breath and wheezing.   Cardiovascular: Negative for chest pain, palpitations and leg swelling.  Genitourinary: Positive for frequency. Negative for dysuria, urgency, hematuria, flank pain, decreased urine volume, enuresis, difficulty urinating and pelvic pain.  Musculoskeletal: Negative for back pain and joint swelling.  Neurological: Negative for dizziness, syncope, speech difficulty, numbness and headaches.  Psychiatric/Behavioral: Negative for dysphoric mood.       Objective:   Physical Exam VItal signs reviewed and stable.   GEN: No apparent distress.  Alert and oriented x 3.  Pleasant, conversant, and cooperative to exam. HEENT: head is autraumatic and normocephalic.  Neck is supple without palpable masses or lymphadenopathy.  No JVD or carotid bruits.  Vision intact.  EOMI.  PERRLA.  Sclerae anicteric.  Conjunctivae without pallor or injection. Mucous membranes are moist.  Oropharynx is without erythema, exudates, or other abnormal lesions.   RESP:  Lungs are clear to ascultation bilaterally with good air movement.  No wheezes, ronchi, or rubs. CARDIOVASCULAR: regular rate, normal rhythm.  Clear S1, S2, no murmurs, gallops, or rubs. ABDOMEN: Obese, soft, non-tender, non-distended.  Bowels sounds present in all quadrants and normoactive.  No palpable masses. EXT: warm and dry.  Peripheral pulses equal, intact, and +2 globally.  No clubbing or cyanosis.   Trace edema in bilateral lower extremities, right greater than left. SKIN: warm and dry with normal turgor.  No rashes or abnormal lesions observed. NEURO: CN II-XII grossly intact.  Muscle strength +5/5 in bilateral upper and lower extremities.  Sensation is grossly intact.  No focal deficit. MSK: Tenderness to palpation is present along the right inferior costal margin.  There are no other palpable abnormalities. There is no erythema, rash, abnormal lesion, or induration. There is no tenderness to palpation along the sternum or left costal margin.        Assessment & Plan:

## 2010-11-14 DIAGNOSIS — R0789 Other chest pain: Secondary | ICD-10-CM | POA: Insufficient documentation

## 2010-11-14 NOTE — Assessment & Plan Note (Signed)
Ms. Behe complains of tenderness below her right breast. She describes a sensation of "swolleness" along her bra line.  She describes the pain as intermittent and aggravated by her bra and with movement. She has not noted any alleviating factors and states she has not tried to take any over-the-counter pain medications to alleviate this discomfort. She has tenderness to palpation along the right inferior costal margin; there are no other abnormalities on exam.  I believe her discomfort is multifactorial and the result of irritation from her bra, some degree of muscle strain aggravated by her obesity.  She is advised to try over-the-counter ibuprofen and to avoid wearing a bra as much as she is able.  She is also advised to try the application of an ice pack to help reduce inflammation. She was provided with an instructional handout for treatment of chest wall tenderness and costochondritis. I will followup her symptoms at her next office visit.

## 2010-11-14 NOTE — Assessment & Plan Note (Addendum)
Sonya Lane is due for a fasting lipid panel. She is not fasting today. I will have her return for a fasting lipid panel later this week.  To date she has refused statin therapy; we will likely need to readdress this issue pending the results of her lipid panel.

## 2010-11-14 NOTE — Assessment & Plan Note (Addendum)
Sonya Lane continues to experience an intermittent dry cough. She denies any hemoptysis or other associated symptoms. I believe her cough may represent untreated GERD as she also admits to occasional hoarseness particularly in the mornings intermittent symptoms of indigestion. I will prescribe ranitidine and have her followup in 2 weeks to assess for response to this medication.  If her symptoms are not improved and may be prudent to attempt a chart trial of therapy witha PPI before pursuing further workup of her cough.

## 2010-11-14 NOTE — Assessment & Plan Note (Signed)
Her blood pressure remained stable and fairly well-controlled on her current regimen. She is tolerating her medications and does not note any adverse effects. Will check a comprehensive metabolic panel today to assess her electrolyte status and renal function. Earlier this year her lisinopril was discontinued as there was concern she was experiencing ACE-related cough. Her cough has not resolved following discontinuation of lisinopril.  She has diabetes mellitus type 2 as well as in elevated microalbumin creatinine ratio and would benefit from ACE inhibitor therapy. We will resume her lisinopril today.

## 2010-11-20 ENCOUNTER — Other Ambulatory Visit: Payer: Self-pay | Admitting: *Deleted

## 2010-11-20 DIAGNOSIS — I10 Essential (primary) hypertension: Secondary | ICD-10-CM

## 2010-11-20 DIAGNOSIS — E119 Type 2 diabetes mellitus without complications: Secondary | ICD-10-CM

## 2010-11-20 LAB — COMPREHENSIVE METABOLIC PANEL
ALT: 52 U/L — ABNORMAL HIGH (ref 0–35)
AST: 45 U/L — ABNORMAL HIGH (ref 0–37)
Chloride: 100 mEq/L (ref 96–112)
Creat: 0.86 mg/dL (ref 0.50–1.10)
Total Bilirubin: 0.5 mg/dL (ref 0.3–1.2)

## 2010-11-20 LAB — LIPID PANEL
HDL: 60 mg/dL (ref >39)
Total CHOL/HDL Ratio: 3.4 Ratio
Triglycerides: 129 mg/dL (ref <150)
VLDL: 26 mg/dL (ref 0–40)

## 2010-11-21 ENCOUNTER — Telehealth: Payer: Self-pay | Admitting: *Deleted

## 2010-11-21 NOTE — Telephone Encounter (Signed)
Pt refuses appt, states she always has back pain w/ yeast, wants a pill called in

## 2010-11-21 NOTE — Telephone Encounter (Signed)
Pt calls and states she knows she has a yeast inf, she is having lower back pain and wants something called in, i attempted to call her back twice and got vmail, i have lm for rtc

## 2010-11-22 ENCOUNTER — Telehealth: Payer: Self-pay | Admitting: *Deleted

## 2010-11-22 NOTE — Telephone Encounter (Signed)
Call from said that she is supposed to be on Lisinopril 2 tablets daily.  Pt said that the pharmacy had enough for 1 daily.  Refused to pick up prescription.  Pt said that she is supposed to be stopping the Coreg as well.   Pt was confused when told that she should also be on HCTZ.  Pt then said that she is taking the HCTZ and the Coreg only now.  Question about the number of pills for GERD.  Zantac pt said that the prescription was for 10 only.  Pt was informed that she can purchase this OTC.  Pt said that she wants to cancel her next appointment since she is not currently taking the Lisinopril or the Zantac.  Pt was informed that Dr. Jerelene Redden was away today and will return next week.

## 2010-11-22 NOTE — Telephone Encounter (Signed)
Please advise her to try an OTC Vaginal Yeast treatment such as Fluconazole intravaginal cream.  I will not call her in any meds unless she is seen and evaluated.  Thanks!  -Erasmo Downer

## 2010-11-22 NOTE — Telephone Encounter (Signed)
Patient should probably bring all of her medications in and go over them with dr. Jerelene Redden

## 2010-11-25 ENCOUNTER — Other Ambulatory Visit: Payer: Self-pay | Admitting: Internal Medicine

## 2010-11-25 DIAGNOSIS — I10 Essential (primary) hypertension: Secondary | ICD-10-CM

## 2010-11-25 MED ORDER — LISINOPRIL 20 MG PO TABS: 20.0000 mg | ORAL_TABLET | Freq: Every day | ORAL | Status: DC

## 2010-11-25 NOTE — Telephone Encounter (Signed)
Patient should be taking her lisinopril, amlodipine, HCTZ, and Coreg.   Her lisinopril was recently resumed at her last clinic appointment; it is important that she start taking this medication.  I will submit a refill for her lisinopril today; she should only be taking one lisinopril pill every day.  Please call her to inform her of the new prescription was sent and that she should be taking all of her medications.

## 2010-11-26 ENCOUNTER — Encounter: Payer: Self-pay | Admitting: Internal Medicine

## 2010-11-27 ENCOUNTER — Telehealth: Payer: Self-pay | Admitting: Internal Medicine

## 2010-11-27 NOTE — Telephone Encounter (Signed)
Call to pt message left for pt to call the Clinics about her medication and refills.

## 2010-11-27 NOTE — Telephone Encounter (Signed)
Outpatient to discuss her medication regimen for hypertension and she is somewhat confused as to what she should be taking.  She was hoping to stop taking the carvedilol and resume the lisinopril; intention was for her to continue the carvedilol as well as lisinopril.  She would like to minimize her medications.  I feel that it is reasonable to try to continue her on lisinopril, HCTZ, amlodipine and to discontinue the Coreg for now.   Now so thought that she had to take the lisinopril twice a day and was confused as to how to take this; I informed her she only needs to take one tablet daily.   She is clear on this plan and is able to explain to me which medication she should take and how.  I will have her followup in approximately one month to recheck her blood pressure; if she requires further blood pressure control I will resume her Coreg.  We'll plan to check a basic metabolic panel at her next visit.Marland Kitchen

## 2010-12-23 ENCOUNTER — Telehealth: Payer: Self-pay | Admitting: *Deleted

## 2010-12-23 DIAGNOSIS — I10 Essential (primary) hypertension: Secondary | ICD-10-CM

## 2010-12-23 NOTE — Telephone Encounter (Signed)
Pt calls and states she is doing well on lisinopril, desires refills, states she cannot pay $106.00 to come in to see the md and request to forgo visit and have more refills for the lisinopril. She uses [facility]. Please advise if this acceptable. You may speak w/ pt at the ph# listed on chart  Thank you, h.

## 2010-12-25 MED ORDER — LISINOPRIL 20 MG PO TABS: 20.0000 mg | ORAL_TABLET | Freq: Every day | ORAL | Status: DC

## 2010-12-25 NOTE — Telephone Encounter (Signed)
I will refill for one month.  Patient should discuss further management with her primary care physician Dr. Jerelene Redden

## 2011-01-06 NOTE — Telephone Encounter (Signed)
Please per dr Marinda Elk make an appt for pt at first available w/ pcp

## 2011-02-18 ENCOUNTER — Encounter: Payer: Self-pay | Admitting: Internal Medicine

## 2011-02-20 ENCOUNTER — Ambulatory Visit (INDEPENDENT_AMBULATORY_CARE_PROVIDER_SITE_OTHER): Payer: Self-pay | Admitting: Internal Medicine

## 2011-02-20 ENCOUNTER — Encounter: Payer: Self-pay | Admitting: Internal Medicine

## 2011-02-20 VITALS — BP 142/91 | HR 86 | Temp 98.6°F | Resp 20 | Ht 65.0 in | Wt 293.8 lb

## 2011-02-20 DIAGNOSIS — Z23 Encounter for immunization: Secondary | ICD-10-CM

## 2011-02-20 DIAGNOSIS — E119 Type 2 diabetes mellitus without complications: Secondary | ICD-10-CM

## 2011-02-20 DIAGNOSIS — F439 Reaction to severe stress, unspecified: Secondary | ICD-10-CM

## 2011-02-20 DIAGNOSIS — Z0000 Encounter for general adult medical examination without abnormal findings: Secondary | ICD-10-CM | POA: Insufficient documentation

## 2011-02-20 DIAGNOSIS — Z733 Stress, not elsewhere classified: Secondary | ICD-10-CM

## 2011-02-20 DIAGNOSIS — I10 Essential (primary) hypertension: Secondary | ICD-10-CM

## 2011-02-20 LAB — BASIC METABOLIC PANEL
Potassium: 4.2 mEq/L (ref 3.5–5.3)
Sodium: 139 mEq/L (ref 135–145)

## 2011-02-20 MED ORDER — LISINOPRIL 20 MG PO TABS: 20.0000 mg | ORAL_TABLET | Freq: Every day | ORAL | Status: DC

## 2011-02-20 NOTE — Assessment & Plan Note (Signed)
Will give annual flu vaccination as well as tdap today

## 2011-02-20 NOTE — Progress Notes (Signed)
  Subjective:    Patient ID: Sonya Lane, female    DOB: December 29, 1950, 60 y.o.   MRN: [id number]  Hypertension Pertinent negatives include no chest pain, headaches, neck pain, palpitations or shortness of breath.    Please see the A&P for the status of the pt's chronic medical problems.  Review of Systems  Constitutional: Negative for fever, chills, diaphoresis, activity change and appetite change.  HENT: Negative for hearing loss, congestion, facial swelling, sneezing, neck pain, postnasal drip and ear discharge.   Eyes: Negative for photophobia and pain.  Respiratory: Positive for cough. Negative for apnea, chest tightness, shortness of breath and wheezing.   Cardiovascular: Negative for chest pain, palpitations and leg swelling.  Genitourinary: Positive for frequency. Negative for dysuria, urgency, hematuria, flank pain, decreased urine volume, enuresis, difficulty urinating and pelvic pain.  Musculoskeletal: Negative for back pain and joint swelling.  Neurological: Negative for dizziness, syncope, speech difficulty, numbness and headaches.  Psychiatric/Behavioral: Negative for dysphoric mood.       Objective:   Physical Exam  VItal signs reviewed and stable.   GEN: No apparent distress.  Alert and oriented x 3.  Pleasant, conversant, and cooperative to exam. HEENT: head is autraumatic and normocephalic.  Neck is supple without palpable masses or lymphadenopathy.  No JVD or carotid bruits.  Vision intact.  EOMI.  PERRLA.  Sclerae anicteric.  Conjunctivae without pallor or injection. Mucous membranes are moist.  Oropharynx is without erythema, exudates, or other abnormal lesions.   RESP:  Lungs are clear to ascultation bilaterally with good air movement.  No wheezes, ronchi, or rubs. CARDIOVASCULAR: regular rate, normal rhythm.  Clear S1, S2, no murmurs, gallops, or rubs. ABDOMEN: Obese, soft, non-tender, non-distended.  Bowels sounds present in all quadrants and normoactive.  No  palpable masses. EXT: warm and dry.  Peripheral pulses equal, intact, and +2 globally.  No clubbing or cyanosis.  Trace edema in bilateral lower extremities, right greater than left. SKIN: warm and dry with normal turgor.  No rashes or abnormal lesions observed. NEURO: CN II-XII grossly intact.  Muscle strength +5/5 in bilateral upper and lower extremities.  Sensation is grossly intact.  No focal deficit. MSK: Tenderness to palpation is present along the right inferior costal margin.  There are no other palpable abnormalities. There is no erythema, rash, abnormal lesion, or induration. There is no tenderness to palpation along the sternum or left costal margin.        Assessment & Plan:

## 2011-02-20 NOTE — Assessment & Plan Note (Signed)
Patient's son was recently diagnosed with cancer. He has completed treatment and it is felt that there are no further treatment options available to him at this point.  Patient is understandably upset about this but is hopeful that he will do well nonetheless. She has family and friends whom she feels she can talk with. She feels that she is handling this well.  Denies suicidal ideation

## 2011-02-20 NOTE — Patient Instructions (Signed)
Schedule a follow up appointment with Dr. Jerelene Redden in 6 months, sooner if needed. Please call me at [phone] in about 3 months to discuss your blood pressure measurements. Keep taking all of your medications as directed

## 2011-02-20 NOTE — Assessment & Plan Note (Addendum)
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  Will f/u report from visit.  She is doing well on metformin. We'll check hemoglobin A1c today

## 2011-02-20 NOTE — Assessment & Plan Note (Addendum)
Pt has been keeping a home record of her BP measurements.  SBP measurements are averaging in the low-mid 120s.    She is doing very well after resuming lisinopril. We'll continue off her medications for now. Advised her to continue to record her blood pressure measurements. If her pressures continued to be below 120 we'll consider discontinuing one of her antihypertensive medications.  Will check a basic metabolic panel today.

## 2011-03-24 ENCOUNTER — Other Ambulatory Visit: Payer: Self-pay | Admitting: *Deleted

## 2011-03-24 DIAGNOSIS — I10 Essential (primary) hypertension: Secondary | ICD-10-CM

## 2011-03-24 MED ORDER — AMLODIPINE BESYLATE 10 MG PO TABS: 10.0000 mg | ORAL_TABLET | Freq: Every day | ORAL | Status: DC

## 2011-03-24 NOTE — Telephone Encounter (Signed)
Would you please call in this rx for Ms Dura?  Thanks!

## 2011-03-25 NOTE — Telephone Encounter (Signed)
Norvasc rx refill request faxed to [place].

## 2011-04-21 ENCOUNTER — Other Ambulatory Visit: Payer: Self-pay | Admitting: *Deleted

## 2011-04-21 MED ORDER — GLIPIZIDE 10 MG PO TABS: 10.0000 mg | ORAL_TABLET | Freq: Two times a day (BID) | ORAL | Status: DC

## 2011-04-21 NOTE — Telephone Encounter (Signed)
Would you please call in this rx for her?  Thanks!

## 2011-04-22 NOTE — Telephone Encounter (Signed)
Glucotrol rx refill request faxed to [facility].

## 2011-08-21 ENCOUNTER — Ambulatory Visit (INDEPENDENT_AMBULATORY_CARE_PROVIDER_SITE_OTHER): Payer: Self-pay | Admitting: Internal Medicine

## 2011-08-21 ENCOUNTER — Encounter: Payer: Self-pay | Admitting: Internal Medicine

## 2011-08-21 VITALS — BP 139/79 | HR 82 | Temp 97.0°F | Ht 65.0 in | Wt 297.2 lb

## 2011-08-21 DIAGNOSIS — I10 Essential (primary) hypertension: Secondary | ICD-10-CM

## 2011-08-21 DIAGNOSIS — E785 Hyperlipidemia, unspecified: Secondary | ICD-10-CM

## 2011-08-21 DIAGNOSIS — Z634 Disappearance and death of family member: Secondary | ICD-10-CM | POA: Insufficient documentation

## 2011-08-21 DIAGNOSIS — Z79899 Other long term (current) drug therapy: Secondary | ICD-10-CM

## 2011-08-21 DIAGNOSIS — F4321 Adjustment disorder with depressed mood: Secondary | ICD-10-CM | POA: Insufficient documentation

## 2011-08-21 DIAGNOSIS — E119 Type 2 diabetes mellitus without complications: Secondary | ICD-10-CM

## 2011-08-21 LAB — COMPREHENSIVE METABOLIC PANEL
CO2: 27 mEq/L (ref 19–32)
Creat: 0.84 mg/dL (ref 0.50–1.10)
Glucose, Bld: 233 mg/dL — ABNORMAL HIGH (ref 70–99)
Total Bilirubin: 0.6 mg/dL (ref 0.3–1.2)
Total Protein: 7 g/dL (ref 6.0–8.3)

## 2011-08-21 LAB — LIPID PANEL
Cholesterol: 194 mg/dL (ref 0–200)
HDL: 56 mg/dL (ref >39)
Total CHOL/HDL Ratio: 3.5 Ratio

## 2011-08-21 LAB — POCT GLYCOSYLATED HEMOGLOBIN (HGB A1C): Hemoglobin A1C: 7.3

## 2011-08-21 MED ORDER — CARVEDILOL 25 MG PO TABS: 25.0000 mg | ORAL_TABLET | Freq: Two times a day (BID) | ORAL | Status: DC

## 2011-08-21 MED ORDER — AMLODIPINE BESYLATE 10 MG PO TABS: 10.0000 mg | ORAL_TABLET | Freq: Every day | ORAL | Status: DC

## 2011-08-21 MED ORDER — GLIPIZIDE 10 MG PO TABS: 10.0000 mg | ORAL_TABLET | Freq: Two times a day (BID) | ORAL | Status: DC

## 2011-08-21 MED ORDER — LISINOPRIL-HYDROCHLOROTHIAZIDE 20-25 MG PO TABS: 1.0000 | ORAL_TABLET | Freq: Every day | ORAL | Status: DC

## 2011-08-21 MED ORDER — METFORMIN HCL 1000 MG PO TABS: 1000.0000 mg | ORAL_TABLET | Freq: Two times a day (BID) | ORAL | Status: DC

## 2011-08-21 NOTE — Assessment & Plan Note (Signed)
Patient is experiencing understandable grief at the death of her son in 03/20/2023. She is able to keep up with all of her usual activities.  She denies depression and suicidal ideation. She states she does not feel as though she needs any medicine to help her with her grief. She has lots of support at home with friends, family members, and her church.

## 2011-08-21 NOTE — Assessment & Plan Note (Addendum)
Hemoglobin A1c today is 7.3; this is stable. Patient is tolerating her metformin and Glucotrol well. She denies any symptoms of hypoglycemia. We'll continue with her current medication regimen.  Will perform annual diabetic foot exam today.

## 2011-08-21 NOTE — Assessment & Plan Note (Signed)
Blood pressure is stable on her current medication regimen.  Will combine lisinopril and HCTZ into a single tablet to minimize her pill burden.  Will continue all of her other antihypertensive agents. Will check a basic metabolic panel today to assess her electrolyte status and renal function.

## 2011-08-21 NOTE — Patient Instructions (Signed)
Your lisinopril and hydrochlorothiazide blood pressure medicines have been combined into one single pill.  You will only need to take one of these pills a day. Keep taking all of your medicines as directed. I will call you if any of the blood work is not normal.

## 2011-08-21 NOTE — Progress Notes (Signed)
Patient ID: Sonya Lane, female   DOB: 10-Mar-1951, 61 y.o.   MRN: [id number]   Subjective:   HPI: Patient is a 61 year old female here today for routine followup. Please see the A&P for the status of the pt's chronic medical problems.  Patient states her son died in 2011/03/08 as a result of his bone cancer.  Patient is understandably upset about this but feels she managing her grief well.  She has lots of family and social support; spends a lot of time talking with her pastor.  She denies depression and suicidal ideation. She is able to keep up with her usual activities.  Denies increase or decrease in sleep.  Review of Systems  Constitutional: Negative for fever, chills, diaphoresis, activity change and appetite change.  HENT: Negative for hearing loss, congestion, facial swelling, sneezing, neck pain, postnasal drip and ear discharge.   Eyes: Negative for photophobia and pain.  Respiratory: . Negative for apnea, cough, chest tightness, shortness of breath and wheezing.   Cardiovascular: Negative for chest pain, palpitations and leg swelling.  Genitourinary:  Negative for dysuria, urgency, frequency, hematuria, flank pain, decreased urine volume, enuresis, difficulty urinating and pelvic pain.  Musculoskeletal: Negative for back pain and joint swelling.  Neurological: Negative for dizziness, syncope, speech difficulty, numbness and headaches.  Psychiatric/Behavioral: Negative for dysphoric mood.       Objective:   Physical Exam  VItal signs reviewed and stable.   GEN: No apparent distress.  Alert and oriented x 3.  Pleasant, conversant, and cooperative to exam.  Occasionally tearful when talking about her son HEENT: head is autraumatic and normocephalic.  Neck is supple without palpable masses or lymphadenopathy.  No JVD or carotid bruits.  Vision intact.  EOMI.  PERRLA.  Sclerae anicteric.  Conjunctivae without pallor or injection. Mucous membranes are moist.  Oropharynx is  without erythema, exudates, or other abnormal lesions.   RESP:  Lungs are clear to ascultation bilaterally with good air movement.  No wheezes, ronchi, or rubs. CARDIOVASCULAR: regular rate, normal rhythm.  Clear S1, S2, no murmurs, gallops, or rubs. ABDOMEN: Obese, soft, non-tender, non-distended.  Bowels sounds present in all quadrants and normoactive.  No palpable masses. EXT: warm and dry.  Peripheral pulses equal, intact, and +2 globally.  No clubbing or cyanosis. No edema in bilateral lower extremities, right greater than left. SKIN: warm and dry with normal turgor.  No rashes or abnormal lesions observed. NEURO: CN II-XII grossly intact.  Muscle strength +5/5 in bilateral upper and lower extremities.  Sensation is grossly intact.  No focal deficit.   Assessment & Plan:

## 2011-08-21 NOTE — Assessment & Plan Note (Signed)
Patient is intolerant to statins. Will check a lipid profile today.

## 2011-11-12 ENCOUNTER — Other Ambulatory Visit: Payer: Self-pay | Admitting: *Deleted

## 2011-11-13 MED ORDER — METFORMIN HCL 1000 MG PO TABS: 1000.0000 mg | ORAL_TABLET | Freq: Two times a day (BID) | ORAL | Status: DC

## 2012-01-27 ENCOUNTER — Ambulatory Visit (INDEPENDENT_AMBULATORY_CARE_PROVIDER_SITE_OTHER): Payer: Self-pay | Admitting: Internal Medicine

## 2012-01-27 ENCOUNTER — Encounter: Payer: Self-pay | Admitting: Internal Medicine

## 2012-01-27 VITALS — BP 131/76 | HR 93 | Temp 97.6°F | Ht 65.0 in | Wt 293.4 lb

## 2012-01-27 DIAGNOSIS — G629 Polyneuropathy, unspecified: Secondary | ICD-10-CM

## 2012-01-27 DIAGNOSIS — Z79899 Other long term (current) drug therapy: Secondary | ICD-10-CM

## 2012-01-27 DIAGNOSIS — E1142 Type 2 diabetes mellitus with diabetic polyneuropathy: Secondary | ICD-10-CM

## 2012-01-27 DIAGNOSIS — E1149 Type 2 diabetes mellitus with other diabetic neurological complication: Secondary | ICD-10-CM

## 2012-01-27 DIAGNOSIS — E119 Type 2 diabetes mellitus without complications: Secondary | ICD-10-CM

## 2012-01-27 LAB — CBC
HCT: 40.5 % (ref 36.0–46.0)
MCH: 27.3 pg (ref 26.0–34.0)
MCHC: 34.1 g/dL (ref 30.0–36.0)
RDW: 14.5 % (ref 11.5–15.5)

## 2012-01-27 LAB — GLUCOSE, CAPILLARY: Glucose-Capillary: 224 mg/dL — ABNORMAL HIGH (ref 70–99)

## 2012-01-27 LAB — POCT GLYCOSYLATED HEMOGLOBIN (HGB A1C): Hemoglobin A1C: 7.6

## 2012-01-27 LAB — VITAMIN B12: Vitamin B-12: 601 pg/mL (ref 211–911)

## 2012-01-27 MED ORDER — GABAPENTIN 300 MG PO CAPS: 300.0000 mg | ORAL_CAPSULE | Freq: Three times a day (TID) | ORAL | Status: DC

## 2012-01-27 NOTE — Assessment & Plan Note (Addendum)
May be related to diabetes.  -HIV, B12, RPR, CBC -Titrate gabapentin up to 300 mg 3 times a day Return to clinic in one month for followup

## 2012-01-27 NOTE — Assessment & Plan Note (Signed)
Lab Results  Component Value Date   HGBA1C 7.6 01/27/2012   HGBA1C 7.8 11/30/2009   CREATININE 0.84 08/21/2011   CREATININE 0.79 08/23/2009   MICROALBUR 29.67* 08/16/2010   MICRALBCREAT 153.3* 08/16/2010   CHOL 194 08/21/2011   HDL 56 08/21/2011   TRIG 107 08/21/2011    Last eye exam and foot exam:    Component Value Date/Time   HMDIABFOOTEX done 10/24/2008    Assessment: Diabetes control: not controlled Progress toward goals: deteriorated Barriers to meeting goals: lack of understanding of disease management  Plan: Diabetes treatment: continue current medications Refer to: none Instruction/counseling given: reminded to bring medications to each visit and discussed diet  Patient reports worsening diet due to increased stressors in life right now. She does not check her blood sugars at home, and reports that she cannot afford testing supplies at this time. She reports occasional symptoms of dizziness, feeling nauseated, feeling weak that are likely due to hypoglycemia, as symptoms resolve when she eats something. She has not checked her blood sugars during these episodes. I am hesitant to change her anti-ischemic regimen since she is unable to check her sugars, so we will continue to maintain this regimen for now and she agrees to improve her diet and start exercising if the burning sensation in her feet improved. She will return in one month for followup.

## 2012-01-27 NOTE — Patient Instructions (Addendum)
Start Gabapentin 314m (1 tablet) x 1 day, then 1 tablet twice a day x 1 day, then 1 tablet three times daily.  This medication can make you drowsy, so try not to drive when initially starting this medication to see how you react.  Please be sure to bring all of your medications with you to every visit.  Should you have any new or worsening symptoms, please be sure to call the clinic at [phone]

## 2012-01-27 NOTE — Progress Notes (Signed)
Subjective:   Patient ID: Sonya Lane female   DOB: 24-Jun-1950 61 y.o.   MRN: [id number]  HPI: [name] is a 61 y.o. woman with history significant for diabetes, hypertension, and morbid obesity who presents with  Burning feet: especially pedal aspect, intermittent, but worsening over the last 2 years, feels like hot coals; occasional ankle swelling, some days worse than others; +occasional numbness between toes  Better: with wet rag or ice, otherwise can't sleep Worse with walking, standing up  Doesn't check blood sugars at home, she cannot afford supplies. Worsening diet with increased life stress. Does feel symptoms occassions of palpitations, floaters, diaphoresis.  Better with crackers.   Normal BMs. Can't tell about temp intolerance.  Can't tell about change in hair texture, but thinks hair and skin are very dry.    Tries not to eat dessert.    Past Medical History  Diagnosis Date  . DIABETES MELLITUS, TYPE II 03/17/2006        . HYPERLIPIDEMIA 03/17/2006    Qualifier: Diagnosis of  By: Sharlet Salina MD, [place]    . HYPERTENSION 03/17/2006        . Morbid obesity 01/25/2008    Qualifier: Diagnosis of  By: Phifer MD, Izora Gala     Current Outpatient Prescriptions  Medication Sig Dispense Refill  . amLODipine (NORVASC) 10 MG tablet Take 1 tablet (10 mg total) by mouth daily.  30 tablet  6  . aspirin 81 MG tablet Take 81 mg by mouth daily.        . carvedilol (COREG) 25 MG tablet Take 1 tablet (25 mg total) by mouth 2 (two) times daily with a meal.  180 tablet  3  . cyclobenzaprine (FLEXERIL) 5 MG tablet Take 5 mg by mouth at bedtime. For muscle pain/spasm       . glipiZIDE (GLUCOTROL) 10 MG tablet Take 1 tablet (10 mg total) by mouth 2 (two) times daily before a meal.  120 tablet  6  . lisinopril-hydrochlorothiazide (PRINZIDE,ZESTORETIC) 20-25 MG per tablet Take 1 tablet by mouth daily.  90 tablet  3  . loratadine (CLARITIN) 10 MG tablet Take 1 tablet (10 mg total) by  mouth daily.  90 tablet  3  . metFORMIN (GLUCOPHAGE) 1000 MG tablet Take 1 tablet (1,000 mg total) by mouth 2 (two) times daily with a meal.  180 tablet  3  . Multiple Vitamin (MULTIVITAMIN) tablet Take 1 tablet by mouth daily.        . ranitidine (ZANTAC) 150 MG tablet Take 1 tablet (150 mg total) by mouth 2 (two) times daily.  14 tablet  0   No family history on file. History   Social History  . Marital Status: Single    Spouse Name: N/A    Number of Children: N/A  . Years of Education: N/A   Social History Main Topics  . Smoking status: Never Smoker   . Smokeless tobacco: None  . Alcohol Use: No  . Drug Use: No  . Sexually Active: None   Other Topics Concern  . None   Social History Narrative   [name]  September 12, 2009 11:11 AMFinancial assistance approved for 40% discount at [facility] and has Anthony  Oct 02, 2009 2:20 PM   Review of Systems: General: no fevers, chills, changes in weight, changes in appetite Skin: no rash HEENT: no blurry vision, hearing changes, sore throat Pulm: no dyspnea, coughing, wheezing CV: no chest pain, palpitations, shortness of breath  Abd: no abdominal pain, nausea/vomiting, diarrhea/constipation GU: no dysuria, hematuria, polyuria Ext: no arthralgias, myalgias Neuro: no weakness   Objective:  Physical Exam: Filed Vitals:   01/27/12 0848  BP: 131/76  Pulse: 93  Temp: 97.6 F (36.4 C)  TempSrc: Oral  Height: 5' 5"  (1.651 m)  Weight: 293 lb 6.4 oz (133.085 kg)  SpO2: 96%   Constitutional: Vital signs reviewed.  Patient is an obese woman in no acute distress and cooperative with exam.  Eyes: PERRL, EOMI, conjunctivae normal, No scleral icterus.  Neck: Supple, Trachea midline normal ROM, No JVD, mass, thyromegaly, or carotid bruit present.  Cardiovascular: RRR, S1 normal, S2 normal, no MRG, pulses symmetric and intact bilaterally, no lower extremity edema Pulmonary/Chest: CTAB, no wheezes, rales, or rhonchi Abdominal:  Soft. Non-tender, non-distended, bowel sounds are normal, no masses, organomegaly, or guarding present.  Musculoskeletal: No joint deformities, erythema, or stiffness, ROM full and no nontender Neurological: A&O x3, Strength is normal and symmetric bilaterally, cranial nerve II-XII are grossly intact, no focal motor deficit, sensory intact to light touch bilaterally (including both feet).  Skin: Warm, dry and intact. No rash, cyanosis, or clubbing.  no rash or erythema on feet  Psychiatric: Normal mood and affect. speech and behavior is normal. Judgment and thought content normal. Cognition and memory are normal.   Assessment & Plan:   Case and care discussed with Dr. Cathren Laine. Please see problem-oriented charting for further details. Patient to return in one month for followup of neuropathy.

## 2012-01-28 LAB — RPR

## 2012-01-29 ENCOUNTER — Telehealth: Payer: Self-pay | Admitting: *Deleted

## 2012-01-29 NOTE — Telephone Encounter (Signed)
Does she want generic Gabapentin sent to [facility]?  Thanks!

## 2012-01-29 NOTE — Telephone Encounter (Signed)
Pt called generic Neurontin is over $50.00 for Rx - did not get. Does not want to go to [facility]. Pharm at [facility] told her they have another med on their list low cost meds. Pt wants you to send another Rx to [facility]. Hilda Blades Latoria Dry RN 01/29/12 4:15PM

## 2012-02-03 ENCOUNTER — Other Ambulatory Visit: Payer: Self-pay | Admitting: *Deleted

## 2012-02-03 DIAGNOSIS — I10 Essential (primary) hypertension: Secondary | ICD-10-CM

## 2012-02-04 MED ORDER — GLIPIZIDE 10 MG PO TABS: 10.0000 mg | ORAL_TABLET | Freq: Two times a day (BID) | ORAL | Status: DC

## 2012-02-04 MED ORDER — AMLODIPINE BESYLATE 10 MG PO TABS: 10.0000 mg | ORAL_TABLET | Freq: Every day | ORAL | Status: DC

## 2012-02-04 NOTE — Telephone Encounter (Signed)
Could you please refill these medications.   Thanks  Stefana Lodico

## 2012-02-06 NOTE — Telephone Encounter (Signed)
Called to pharm 

## 2012-02-23 ENCOUNTER — Other Ambulatory Visit: Payer: Self-pay | Admitting: Internal Medicine

## 2012-02-23 NOTE — Progress Notes (Signed)
Call from patient regarding high expense of gabapentin. By the time I called patient, she had already acquired medication.  She is taking 320m BID and does note some dizziness, but she is retired and at home all day, and knows how to manage herself when she feels dizzy.  She requests a more affordable medication, but would like to complete the gabapentin since she paid for it.  I have asked her to schedule a follow up with any provider in about 2 weeks, and at that visit, she can discuss d/c gabapentin and initiation of amitriptyline [id number].

## 2012-03-02 ENCOUNTER — Telehealth: Payer: Self-pay | Admitting: *Deleted

## 2012-03-02 NOTE — Telephone Encounter (Signed)
Pt request a letter to the [employer] giving pt permission to exercise.  This is required. Last seen in clinic 8/27.  Pt is feeling great and wants to cancel appointment for 10/8.  This was to f/u on medications.   She can't afford another visit.  Is this okay with you?  Pt # [id number]

## 2012-03-03 ENCOUNTER — Encounter: Payer: Self-pay | Admitting: Internal Medicine

## 2012-03-03 NOTE — Telephone Encounter (Signed)
Will send letter & ok to cancel appt.  Thanks.

## 2012-03-03 NOTE — Telephone Encounter (Signed)
Letter mailed to pt, appointment cancelled

## 2012-03-09 ENCOUNTER — Ambulatory Visit: Payer: Self-pay | Admitting: Internal Medicine

## 2012-03-19 ENCOUNTER — Other Ambulatory Visit: Payer: Self-pay | Admitting: *Deleted

## 2012-03-19 DIAGNOSIS — I10 Essential (primary) hypertension: Secondary | ICD-10-CM

## 2012-03-22 MED ORDER — AMLODIPINE BESYLATE 10 MG PO TABS: 10.0000 mg | ORAL_TABLET | Freq: Every day | ORAL | Status: DC

## 2012-03-22 NOTE — Telephone Encounter (Signed)
Last seen 8/27. Pls sch appt PCP end of Nov, Dec, or early Jan Thanks.

## 2012-03-22 NOTE — Telephone Encounter (Signed)
Called to pharm 

## 2012-05-03 ENCOUNTER — Ambulatory Visit (INDEPENDENT_AMBULATORY_CARE_PROVIDER_SITE_OTHER): Payer: PRIVATE HEALTH INSURANCE | Admitting: Internal Medicine

## 2012-05-03 VITALS — BP 137/84 | HR 83 | Temp 97.5°F | Wt 298.5 lb

## 2012-05-03 DIAGNOSIS — G609 Hereditary and idiopathic neuropathy, unspecified: Secondary | ICD-10-CM

## 2012-05-03 DIAGNOSIS — I10 Essential (primary) hypertension: Secondary | ICD-10-CM

## 2012-05-03 DIAGNOSIS — Z79899 Other long term (current) drug therapy: Secondary | ICD-10-CM

## 2012-05-03 DIAGNOSIS — E119 Type 2 diabetes mellitus without complications: Secondary | ICD-10-CM

## 2012-05-03 DIAGNOSIS — E785 Hyperlipidemia, unspecified: Secondary | ICD-10-CM

## 2012-05-03 DIAGNOSIS — G629 Polyneuropathy, unspecified: Secondary | ICD-10-CM

## 2012-05-03 DIAGNOSIS — K219 Gastro-esophageal reflux disease without esophagitis: Secondary | ICD-10-CM

## 2012-05-03 LAB — BASIC METABOLIC PANEL WITH GFR
BUN: 12 mg/dL (ref 6–23)
CO2: 28 mEq/L (ref 19–32)
Chloride: 99 mEq/L (ref 96–112)
Creat: 0.92 mg/dL (ref 0.50–1.10)
Glucose, Bld: 147 mg/dL — ABNORMAL HIGH (ref 70–99)
Potassium: 4.2 mEq/L (ref 3.5–5.3)

## 2012-05-03 LAB — LIPID PANEL
Cholesterol: 212 mg/dL — ABNORMAL HIGH (ref 0–200)
Triglycerides: 143 mg/dL (ref <150)

## 2012-05-03 LAB — GLUCOSE, CAPILLARY: Glucose-Capillary: 222 mg/dL — ABNORMAL HIGH (ref 70–99)

## 2012-05-03 MED ORDER — PRAVASTATIN SODIUM 10 MG PO TABS: ORAL_TABLET | ORAL | Status: DC

## 2012-05-03 MED ORDER — METFORMIN HCL 1000 MG PO TABS: 1000.0000 mg | ORAL_TABLET | Freq: Two times a day (BID) | ORAL | Status: DC

## 2012-05-03 MED ORDER — GABAPENTIN 300 MG PO CAPS: 300.0000 mg | ORAL_CAPSULE | Freq: Three times a day (TID) | ORAL | Status: DC

## 2012-05-03 MED ORDER — GLIPIZIDE 10 MG PO TABS: 10.0000 mg | ORAL_TABLET | Freq: Two times a day (BID) | ORAL | Status: DC

## 2012-05-03 MED ORDER — AMLODIPINE BESYLATE 10 MG PO TABS: 10.0000 mg | ORAL_TABLET | Freq: Every day | ORAL | Status: DC

## 2012-05-03 MED ORDER — RANITIDINE HCL 150 MG PO TABS: 150.0000 mg | ORAL_TABLET | Freq: Two times a day (BID) | ORAL | Status: DC | PRN

## 2012-05-03 NOTE — Assessment & Plan Note (Signed)
Patient is on Metformin and Glipizide currently. A1c today is 7.8, unchanged from previous. It is likely that she will eventually need adjustments in her medications for better glucose control. For now, she prefers to continue to do diet modification with current regimen. She does not check BS at home due to finances (unable to afford strips).  -cont current medications -recheck in 3 months -consider a gliptin at next visit if A1c worse

## 2012-05-03 NOTE — Progress Notes (Signed)
Rx called in to [place] at [facility],  [address], RN, 05/03/2012, 4:36P

## 2012-05-03 NOTE — Assessment & Plan Note (Addendum)
Patient checks her BP at home and reports it to be 881'J systolic. Today it was 146/81, then 137/84  She is currently one HCTZ-lisinopril combo, max dose, Coreg, and Norvasc. We will continue these medications and reassess in f/u visits.  New Rx for Norvasc sent to [facility] drug ($2 per month) as her MAP program will no longer cover this medication.  Check BMP today

## 2012-05-03 NOTE — Patient Instructions (Signed)
Please return to clinic in 1 month for follow up

## 2012-05-03 NOTE — Assessment & Plan Note (Signed)
Patient with a history of burning in chest at night and now complains of fullness and epigastric pain. Encouraged patient to try modifying eating habits such as no food intake 2 hours before bed, small meals during the day, avoiding coffee and acidic foods, however, patient would like to start on a medication at this time. -[facility] sent to [facility] for raniditine PRN -encourage behavior modification as well -f/u in 6 weeks

## 2012-05-03 NOTE — Progress Notes (Signed)
Rx called into [place] [facility], [address], by [employer], 05/03/2012 at 4:22P

## 2012-05-03 NOTE — Progress Notes (Signed)
Rx called in to [place] at [facility],  [address], RN, 05/03/2012, 4:36P

## 2012-05-03 NOTE — Assessment & Plan Note (Addendum)
Patient has a history of myalgias with daily statin use. Her last LDL in March was elevated at 117. Will recheck today.  Discussed with patient the risks and benefits of restarting a statin and she was open to trying low-dose pravastatin twice per week and reassess in 6 weeks. Rx for pravastatin sent to [facility]. F/u in 6 weeks. She knows to call clinic if she is having difficulties with this new medication.  -CMP in 6 weeks

## 2012-05-03 NOTE — Progress Notes (Signed)
Rx called into [place] [facility], [address], by [employer], 05/03/2012 at 4:22P

## 2012-05-03 NOTE — Progress Notes (Signed)
Rx called in to [place] at [facility],  [address], RN, 05/03/2012, 4:36P

## 2012-05-03 NOTE — Progress Notes (Signed)
Internal Medicine Clinic Visit    HPI:  Sonya Lane is a 61 y.o. year old female who presents for regular follow up for DM, HTN, med refills. She has been doing well recently, had some difficulties this year losing her son to cancer but otherwise no complaints. No headaches, chest pain, SOB, no fever, chills, diarrhea, constipation  She states that she has intermittent abdominal pain with feeling "full" upon waking in the morning. No early satiety with meals, no nausea or vomiting.   Past Medical History  Diagnosis Date  . DIABETES MELLITUS, TYPE II 03/17/2006        . HYPERLIPIDEMIA 03/17/2006    Qualifier: Diagnosis of  By: Sharlet Salina MD, [place]    . HYPERTENSION 03/17/2006        . Morbid obesity 01/25/2008    Qualifier: Diagnosis of  By: Phifer MD, Izora Gala      No past surgical history on file.   ROS:  A complete review of systems was otherwise negative, except as noted in the HPI.  Allergies: Pravastatin sodium and Simvastatin  Medications: Current Outpatient Prescriptions  Medication Sig Dispense Refill  . amLODipine (NORVASC) 10 MG tablet Take 1 tablet (10 mg total) by mouth daily.  30 tablet  3  . aspirin 81 MG tablet Take 81 mg by mouth daily.        . carvedilol (COREG) 25 MG tablet Take 1 tablet (25 mg total) by mouth 2 (two) times daily with a meal.  180 tablet  3  . cyclobenzaprine (FLEXERIL) 5 MG tablet Take 5 mg by mouth at bedtime. For muscle pain/spasm       . gabapentin (NEURONTIN) 300 MG capsule Take 1 capsule (300 mg total) by mouth 3 (three) times daily.  90 capsule  3  . glipiZIDE (GLUCOTROL) 10 MG tablet Take 1 tablet (10 mg total) by mouth 2 (two) times daily before a meal.  180 tablet  3  . lisinopril-hydrochlorothiazide (PRINZIDE,ZESTORETIC) 20-25 MG per tablet Take 1 tablet by mouth daily.  90 tablet  3  . loratadine (CLARITIN) 10 MG tablet Take 1 tablet (10 mg total) by mouth daily.  90 tablet  3  . metFORMIN (GLUCOPHAGE) 1000 MG tablet Take 1 tablet  (1,000 mg total) by mouth 2 (two) times daily with a meal.  180 tablet  3  . Multiple Vitamin (MULTIVITAMIN) tablet Take 1 tablet by mouth daily.        . pravastatin (PRAVACHOL) 10 MG tablet Take one tab two times per week (Monday and Friday)  30 tablet  6  . ranitidine (ZANTAC) 150 MG tablet Take 1 tablet (150 mg total) by mouth 2 (two) times daily as needed for heartburn.  60 tablet  3  . [DISCONTINUED] amLODipine (NORVASC) 10 MG tablet Take 1 tablet (10 mg total) by mouth daily.  90 tablet  1  . [DISCONTINUED] gabapentin (NEURONTIN) 300 MG capsule Take 1 capsule (300 mg total) by mouth 3 (three) times daily.  90 capsule  2  . [DISCONTINUED] glipiZIDE (GLUCOTROL) 10 MG tablet Take 1 tablet (10 mg total) by mouth 2 (two) times daily before a meal.  180 tablet  1  . [DISCONTINUED] metFORMIN (GLUCOPHAGE) 1000 MG tablet Take 1 tablet (1,000 mg total) by mouth 2 (two) times daily with a meal.  180 tablet  3    History   Social History  . Marital Status: Single    Spouse Name: N/A    Number of Children: N/A  .  Years of Education: N/A   Occupational History  . Not on file.   Social History Main Topics  . Smoking status: Never Smoker   . Smokeless tobacco: Not on file  . Alcohol Use: No  . Drug Use: No  . Sexually Active: Not on file   Other Topics Concern  . Not on file   Social History Narrative   [name]  September 12, 2009 11:11 AMFinancial assistance approved for 40% discount at [facility] and has [place]  Oct 02, 2009 2:20 PM    family history is not on file.  Physical Exam There were no vitals taken for this visit. General:  No acute distress, alert and oriented x 3, well-appearing  HEENT:  PERRL, EOMI, no lymphadenopathy, moist mucous membranes Cardiovascular:  Regular rate and rhythm, no murmurs Respiratory:  Clear to auscultation bilaterally, no wheezes, rales, or rhonchi Abdomen:  Soft, obese, nondistended, nontender, +bowel sounds, no fluid wave, no  hepatosplenomegaly appreciated. No purple striae. Midline abdominal scar noted. Extremities:  Warm and well-perfused, no clubbing, cyanosis, or edema.  Skin: Warm, dry, no rashes Neuro: Not anxious appearing, no depressed mood, normal affect  Labs: Lab Results  Component Value Date   CREATININE 0.84 08/21/2011   BUN 9 08/21/2011   NA 140 08/21/2011   K 3.9 08/21/2011   CL 100 08/21/2011   CO2 27 08/21/2011   Lab Results  Component Value Date   WBC 7.0 01/27/2012   HGB 13.8 01/27/2012   HCT 40.5 01/27/2012   MCV 80.0 01/27/2012   PLT 275 01/27/2012      Assessment and Plan:  Please see problem-based charting for full A&P.  FOLLOWUP: Sonya Lane will follow back up in our clinic in approximately 6 weeks. Sonya Lane knows to call out clinic in the meantime with any questions or new issues.   Patient was seen and evaluated by Santa Lighter, MD,  and Madilyn Fireman, MD Attending Physician.

## 2012-05-06 ENCOUNTER — Encounter: Payer: Self-pay | Admitting: Internal Medicine

## 2012-05-06 NOTE — Progress Notes (Signed)
I discussed the patient's management with Dr. Sissy Hoff and agree with her documentation.   Thanks for working with me in this patient's care.   Sonya Lane

## 2012-06-03 ENCOUNTER — Ambulatory Visit: Payer: Self-pay | Admitting: Internal Medicine

## 2012-06-15 ENCOUNTER — Ambulatory Visit: Payer: Self-pay | Admitting: Internal Medicine

## 2012-07-12 ENCOUNTER — Encounter: Payer: Self-pay | Admitting: Internal Medicine

## 2012-07-12 ENCOUNTER — Ambulatory Visit (INDEPENDENT_AMBULATORY_CARE_PROVIDER_SITE_OTHER): Payer: PRIVATE HEALTH INSURANCE | Admitting: Internal Medicine

## 2012-07-12 VITALS — BP 141/85 | HR 83 | Temp 97.3°F

## 2012-07-12 DIAGNOSIS — E785 Hyperlipidemia, unspecified: Secondary | ICD-10-CM

## 2012-07-12 DIAGNOSIS — I10 Essential (primary) hypertension: Secondary | ICD-10-CM

## 2012-07-12 DIAGNOSIS — Z0000 Encounter for general adult medical examination without abnormal findings: Secondary | ICD-10-CM

## 2012-07-12 DIAGNOSIS — E119 Type 2 diabetes mellitus without complications: Secondary | ICD-10-CM

## 2012-07-12 MED ORDER — GABAPENTIN 300 MG PO CAPS: 300.0000 mg | ORAL_CAPSULE | Freq: Three times a day (TID) | ORAL | Status: DC

## 2012-07-12 MED ORDER — LISINOPRIL-HYDROCHLOROTHIAZIDE 20-25 MG PO TABS: 2.0000 | ORAL_TABLET | Freq: Every day | ORAL | Status: DC

## 2012-07-12 MED ORDER — PRAVASTATIN SODIUM 20 MG PO TABS: ORAL_TABLET | ORAL | Status: DC

## 2012-07-12 NOTE — Assessment & Plan Note (Addendum)
BP Readings from Last 3 Encounters:  07/12/12 177/100  05/03/12 137/84  01/27/12 131/76    Lab Results  Component Value Date   NA 142 05/03/2012   K 4.2 05/03/2012   CREATININE 0.92 05/03/2012    Plan -Patient with elevated blood pressure during today's visit. Likely some stress and agitation related to her elevated blood pressure. Patient has been high in the past, slightly elevated during most of her recent visits. She will likely benefit from some increased dose of lisinopril HCTZ  -Increase lisinopril-HCTZ dose to 40-50

## 2012-07-12 NOTE — Assessment & Plan Note (Signed)
Patient has been doing well and compliant with low-dose pravastatin twice a week on Mondays and Fridays.  -Obtain CMP today -Will increase dose of pravastatin to 20 mg twice a week, eventually going to 3 times a week and up from there. Will go slow on titration as patient has a history of myalgias with daily statin use

## 2012-07-12 NOTE — Patient Instructions (Addendum)
Please pick up your medications from [facility] and [place].  Please return to clinic in 2 months.

## 2012-07-12 NOTE — Progress Notes (Signed)
Internal Medicine Clinic Visit    HPI:  Sonya Lane is a 62 y.o. year old female who presents for regular follow up for hyperlipidemia, DM2, HTN, med refills.   At the last visit, we discussed starting a statin. Patient states she has been taking her pravastatin Monday and Fridays. Denies myalgias, abdominal pain.  Regarding her blood pressure, patient checks her blood pressure at home and reports it is usually "normal" but she uses a wrist cuff. Did not bring a log. She is currently taking HCTZ-lisinopril, Coreg, Norvasc. Took BP meds today.   GERD: given Rx for ranitidine at last visit, but she didn't fill it. Has stopped eating spicy foods after 3pm and other dietary modifications and states that her GERD symptoms have been alleviated.  DM: doesn't check her BS at home. Has been cutting back on meal portions and sweets since last visit. Has not lost any weight.   Past Medical History  Diagnosis Date  . DIABETES MELLITUS, TYPE II 03/17/2006        . HYPERLIPIDEMIA 03/17/2006    Qualifier: Diagnosis of  By: Sharlet Salina MD, [place]    . HYPERTENSION 03/17/2006        . Morbid obesity 01/25/2008    Qualifier: Diagnosis of  By: Phifer MD, Izora Gala      No past surgical history on file.   ROS:  A complete review of systems was otherwise negative, except as noted in the HPI.  Allergies: Simvastatin  Medications: Current Outpatient Prescriptions  Medication Sig Dispense Refill  . amLODipine (NORVASC) 10 MG tablet Take 1 tablet (10 mg total) by mouth daily.  30 tablet  3  . aspirin 81 MG tablet Take 81 mg by mouth daily.        . carvedilol (COREG) 25 MG tablet Take 1 tablet (25 mg total) by mouth 2 (two) times daily with a meal.  180 tablet  3  . cyclobenzaprine (FLEXERIL) 5 MG tablet Take 5 mg by mouth at bedtime. For muscle pain/spasm       . gabapentin (NEURONTIN) 300 MG capsule Take 1 capsule (300 mg total) by mouth 3 (three) times daily.  90 capsule  3  . gabapentin (NEURONTIN)  300 MG capsule Take 1 capsule (300 mg total) by mouth 3 (three) times daily.  90 capsule  6  . glipiZIDE (GLUCOTROL) 10 MG tablet Take 1 tablet (10 mg total) by mouth 2 (two) times daily before a meal.  180 tablet  3  . lisinopril-hydrochlorothiazide (PRINZIDE,ZESTORETIC) 20-25 MG per tablet Take 2 tablets by mouth daily.  120 tablet  3  . loratadine (CLARITIN) 10 MG tablet Take 1 tablet (10 mg total) by mouth daily.  90 tablet  3  . metFORMIN (GLUCOPHAGE) 1000 MG tablet Take 1 tablet (1,000 mg total) by mouth 2 (two) times daily with a meal.  180 tablet  3  . Multiple Vitamin (MULTIVITAMIN) tablet Take 1 tablet by mouth daily.        . pravastatin (PRAVACHOL) 20 MG tablet Take one tab two times per week (Monday and Friday)  30 tablet  6  . ranitidine (ZANTAC) 150 MG tablet Take 1 tablet (150 mg total) by mouth 2 (two) times daily as needed for heartburn.  60 tablet  3   No current facility-administered medications for this visit.    History   Social History  . Marital Status: Single    Spouse Name: N/A    Number of Children: N/A  . Years  of Education: N/A   Occupational History  . Not on file.   Social History Main Topics  . Smoking status: Never Smoker   . Smokeless tobacco: Not on file  . Alcohol Use: No  . Drug Use: No  . Sexually Active: Not on file   Other Topics Concern  . Not on file   Social History Narrative   [name]  September 12, 2009 11:11 AM   Financial assistance approved for 40% discount at [facility] and has [facility] card   [name]  Oct 02, 2009 2:20 PM    family history is not on file.  Physical Exam Blood pressure 141/85, pulse 83, temperature 97.3 F (36.3 C), temperature source Oral, SpO2 98.00%. General:  No acute distress, alert and oriented x 3, well-appearing  HEENT:  PERRL, EOMI, no lymphadenopathy, moist mucous membranes Cardiovascular:  Regular rate and rhythm, no murmurs Respiratory:  Clear to auscultation bilaterally, no wheezes, rales, or  rhonchi Abdomen:  Soft, obese, nondistended, nontender, +bowel sounds, no fluid wave, no hepatosplenomegaly appreciated. Midline abdominal scar noted. Extremities:  Warm and well-perfused, no clubbing, cyanosis, or edema.  Skin: Warm, dry, no rashes Neuro: Not anxious appearing, no depressed mood, normal affect  Labs: Lab Results  Component Value Date   CREATININE 0.84 07/12/2012   BUN 13 07/12/2012   NA 137 07/12/2012   K 4.1 07/12/2012   CL 97 07/12/2012   CO2 29 07/12/2012   Lab Results  Component Value Date   WBC 7.0 01/27/2012   HGB 13.8 01/27/2012   HCT 40.5 01/27/2012   MCV 80.0 01/27/2012   PLT 275 01/27/2012      Assessment and Plan:  Please see problem-based charting for full A&P.  FOLLOWUP: Sonya Lane will follow back up in our clinic in approximately 6 weeks. Sonya Lane knows to call out clinic in the meantime with any questions or new issues.

## 2012-07-12 NOTE — Assessment & Plan Note (Signed)
-  Patient given referral for mammogram today. She was given information to fill out form for a scholarship. -Ophthalmology referral given as well as patient has diabetes and is due for a dilated eye exam -Patient does not need Pap smear she is status post total hysterectomy

## 2012-07-12 NOTE — Assessment & Plan Note (Signed)
Discussed the importance of losing weight.

## 2012-07-12 NOTE — Assessment & Plan Note (Signed)
Patient not due for A1c check. Will check at next visit  Discussed that patient really needs to lose weight and offered support for her goals.

## 2012-07-13 ENCOUNTER — Other Ambulatory Visit: Payer: Self-pay | Admitting: Internal Medicine

## 2012-07-13 DIAGNOSIS — Z1231 Encounter for screening mammogram for malignant neoplasm of breast: Secondary | ICD-10-CM

## 2012-07-13 LAB — COMPLETE METABOLIC PANEL WITH GFR
ALT: 67 U/L — ABNORMAL HIGH (ref 0–35)
CO2: 29 mEq/L (ref 19–32)
Calcium: 10.5 mg/dL (ref 8.4–10.5)
Chloride: 97 mEq/L (ref 96–112)
Creat: 0.84 mg/dL (ref 0.50–1.10)
GFR, Est African American: 86 mL/min
GFR, Est Non African American: 74 mL/min
Glucose, Bld: 167 mg/dL — ABNORMAL HIGH (ref 70–99)
Total Bilirubin: 0.4 mg/dL (ref 0.3–1.2)
Total Protein: 7.1 g/dL (ref 6.0–8.3)

## 2012-07-20 ENCOUNTER — Telehealth: Payer: Self-pay | Admitting: *Deleted

## 2012-07-20 NOTE — Telephone Encounter (Signed)
Pt calls w/ multiple complaints concerning MY CHART information, she states she has never had a "sexually transmitted disease" and my chart says she has, i pulled her paper chart and told her this was in her hx, she states it is wrong, i explained yes it could be and to discuss this w/ her md when she comes for the next visit.  She states all her records for vaccines are wrong, i ask her to explain what was wrong and she states the dates are all wrong, that she got a "pneumonia shot" last year and it's not on there, i explained that her chart states she got the vaccine in 2009 and she should not have gotten another for 10 yrs but possibly that recommendation had changed and i had not been aware. She states she got her last flu shot in august of 2013 and that it was not recorded, i explained that it was possible that she got it another time and then she stated it was the end of dec or oct. i have ask her to discuss this w/ her physician when she is seen next visit and she is agreeable

## 2012-07-21 ENCOUNTER — Ambulatory Visit (HOSPITAL_COMMUNITY)
Admission: RE | Admit: 2012-07-21 | Discharge: 2012-07-21 | Disposition: A | Discharge status: Home / Self Care | Payer: Self-pay | Source: Ambulatory Visit | Attending: Internal Medicine | Admitting: Internal Medicine

## 2012-08-26 ENCOUNTER — Telehealth: Payer: Self-pay | Admitting: *Deleted

## 2012-08-26 NOTE — Telephone Encounter (Signed)
Continue OTC advil and ibuprofen. Home hot compressions. Agree with clinic appointment.

## 2012-08-26 NOTE — Telephone Encounter (Signed)
Pt called with c/o pain to rt heal and foot.  Hurts to put foot flat and apply weight. Hard to ambulate. Onset all this week  She tried heat and ice with no relief. Also tried wrapping foot. Taking OTC meds.   No swollen, redness noted. No known injury.   Will see 9:45 on Monday. Please advise treatment.  Pt # [id number]

## 2012-08-26 NOTE — Telephone Encounter (Signed)
Pt informed

## 2012-08-30 ENCOUNTER — Encounter: Payer: Self-pay | Admitting: Internal Medicine

## 2012-08-30 ENCOUNTER — Ambulatory Visit (INDEPENDENT_AMBULATORY_CARE_PROVIDER_SITE_OTHER): Payer: PRIVATE HEALTH INSURANCE | Admitting: Internal Medicine

## 2012-08-30 DIAGNOSIS — E119 Type 2 diabetes mellitus without complications: Secondary | ICD-10-CM

## 2012-08-30 DIAGNOSIS — M7750 Other enthesopathy of unspecified foot: Secondary | ICD-10-CM | POA: Insufficient documentation

## 2012-08-30 DIAGNOSIS — M659 Synovitis and tenosynovitis, unspecified: Secondary | ICD-10-CM

## 2012-08-30 DIAGNOSIS — K219 Gastro-esophageal reflux disease without esophagitis: Secondary | ICD-10-CM

## 2012-08-30 LAB — POCT GLYCOSYLATED HEMOGLOBIN (HGB A1C): Hemoglobin A1C: 7.5

## 2012-08-30 MED ORDER — RANITIDINE HCL 150 MG PO TABS: 150.0000 mg | ORAL_TABLET | Freq: Two times a day (BID) | ORAL | Status: AC | PRN

## 2012-08-30 MED ORDER — NAPROXEN 250 MG PO TABS: 250.0000 mg | ORAL_TABLET | Freq: Two times a day (BID) | ORAL | Status: DC

## 2012-08-30 NOTE — Progress Notes (Signed)
Subjective:   Patient ID: Sonya Lane female   DOB: 13-Nov-1950 62 y.o.   MRN: [id number]  HPI: [name] is a 62 y.o. woman who presents to the clinic today for follow up on her chronic medical conditions including diabetes and GERD. See Problem focused Assessment and Plan for full details of her chronic medical conditions.   She also notes pain in her right heel for the last month. She states that it hurts when she is walking.  It is not bad when she first stands up but gets worse as she is walking.  She usually goes barefoot at home.  She denies trauma, falls, or open sore on her foot.    Only taking 1 of the lisinopril  Only taking 1 of the gabapentin qhs.    Past Medical History  Diagnosis Date  . DIABETES MELLITUS, TYPE II 03/17/2006        . HYPERLIPIDEMIA 03/17/2006    Qualifier: Diagnosis of  By: Sharlet Salina MD, [place]    . HYPERTENSION 03/17/2006        . Morbid obesity 01/25/2008    Qualifier: Diagnosis of  By: Phifer MD, Izora Gala     Current Outpatient Prescriptions  Medication Sig Dispense Refill  . amLODipine (NORVASC) 10 MG tablet Take 1 tablet (10 mg total) by mouth daily.  30 tablet  3  . aspirin 81 MG tablet Take 81 mg by mouth daily.        . carvedilol (COREG) 25 MG tablet Take 1 tablet (25 mg total) by mouth 2 (two) times daily with a meal.  180 tablet  3  . cyclobenzaprine (FLEXERIL) 5 MG tablet Take 5 mg by mouth at bedtime. For muscle pain/spasm       . gabapentin (NEURONTIN) 300 MG capsule Take 1 capsule (300 mg total) by mouth 3 (three) times daily.  90 capsule  3  . gabapentin (NEURONTIN) 300 MG capsule Take 1 capsule (300 mg total) by mouth 3 (three) times daily.  90 capsule  6  . glipiZIDE (GLUCOTROL) 10 MG tablet Take 1 tablet (10 mg total) by mouth 2 (two) times daily before a meal.  180 tablet  3  . lisinopril-hydrochlorothiazide (PRINZIDE,ZESTORETIC) 20-25 MG per tablet Take 2 tablets by mouth daily.  120 tablet  3  . loratadine (CLARITIN) 10  MG tablet Take 1 tablet (10 mg total) by mouth daily.  90 tablet  3  . metFORMIN (GLUCOPHAGE) 1000 MG tablet Take 1 tablet (1,000 mg total) by mouth 2 (two) times daily with a meal.  180 tablet  3  . Multiple Vitamin (MULTIVITAMIN) tablet Take 1 tablet by mouth daily.        . pravastatin (PRAVACHOL) 20 MG tablet Take one tab two times per week (Monday and Friday)  30 tablet  6  . ranitidine (ZANTAC) 150 MG tablet Take 1 tablet (150 mg total) by mouth 2 (two) times daily as needed for heartburn.  60 tablet  3   No current facility-administered medications for this visit.   No family history on file. History   Social History  . Marital Status: Single    Spouse Name: N/A    Number of Children: N/A  . Years of Education: N/A   Social History Main Topics  . Smoking status: Never Smoker   . Smokeless tobacco: None  . Alcohol Use: No  . Drug Use: No  . Sexually Active: None   Other Topics Concern  . None  Social History Narrative   [name]  September 12, 2009 11:11 AM   Financial assistance approved for 40% discount at [facility] and has [facility] card   [name]  Oct 02, 2009 2:20 PM   Review of Systems: A full 12 system ROS is negative except as noted in the HPI and A&P.   Objective:  Physical Exam: There were no vitals filed for this visit. Constitutional: Vital signs reviewed.  Patient is a well-developed and well-nourished woman in no acute distress and cooperative with exam. Alert and oriented x3.  Head: Normocephalic and atraumatic Ear: TM normal bilaterally Mouth: no erythema or exudates, MMM Eyes: PERRL, EOMI, conjunctivae normal, No scleral icterus.  Neck: Supple, Trachea midline normal ROM, No JVD, mass, thyromegaly, or carotid bruit present.  Cardiovascular: RRR, S1 normal, S2 normal, no MRG, pulses symmetric and intact bilaterally Pulmonary/Chest: CTAB, no wheezes, rales, or rhonchi Abdominal: Soft. Non-tender, non-distended, bowel sounds are normal, no masses,  organomegaly, or guarding present.  GU: no CVA tenderness Musculoskeletal: There is point tenderness over the posterior heal to palpation and with active ROM.  Rhomberg is normal.  Some pain with standing on the right foot only.  No joint deformities, erythema, or stiffness, ROM full and no nontender Hematology: no cervical, inginal, or axillary adenopathy.  Neurological: A&O x3, Strength is normal and symmetric bilaterally, cranial nerve II-XII are grossly intact, no focal motor deficit, sensory intact to light touch bilaterally.  Skin: Warm, dry and intact. No rash, cyanosis, or clubbing.  Psychiatric: Normal mood and affect. speech and behavior is normal. Judgment and thought content normal. Cognition and memory are normal.   Assessment & Plan:

## 2012-08-30 NOTE — Patient Instructions (Addendum)
1.  You have tendinitis in the right ankle  - We will work on getting you over to Sports Medicine to help your pain  - Use the Naproxen (Aleve) take 1 tablet twice daily with food  - Use the Zantac (Ranitidine) 150 mg tablets.  Take 1 tablet twice daily with meals.  - You can use an ice pack on it as well.  20 minutes at a time, several times daily  2.  Continue your medications as prescribed for now.   3.  Follow up with your PCP in about 2-4 weeks to see how your ankle is doing.   Ankle Exercises for Rehabilitation Following ankle injuries, it is as important to follow your caregivers instructions for regaining full use of your ankle as it was to follow the initial treatment plan following the injury. The following are some suggestions for exercises and treatment, which can be done to help you regain full use of your ankle as soon as possible.  Follow all instructions regarding physical therapy.  Before exercising, it may be helpful to use heat on the muscles or joint being exercised. This loosens up the muscles and tendons (cord like structure) and decreases chances of injury during your exercises. If this is not possible just begin your exercises slowly to gradually warm up.  Stand on your toes several times per dayto strengthen the calf muscles. These are the muscles in the back of your leg between the knee and the heel. The cord you can feel just above the heel is the Achilles tendon. Rise up on your toes several times repeating this three to four times per day. Do not exercise to the point of pain. If pain starts to develop, decrease the exercise until you are comfortable again.  Do range of motion exercises. This means moving the ankle in all directions. Practice writing the alphabet with your toes in the air. Do not increase beyond a range that is comfortable.  Increase the strength of the muscles in the front of your leg by raising your toes and foot straight up in the air. Repeat this  exercise as you did the calf exercise with the same warnings. This also help to stretch your muscles.  Stretch your calf muscles also by leaning against a wall with your hands in front of you. Put your feet a few feet from the wall and bend your knees until you feel the muscles in your calves become tight.  After exercising it may be helpful to put ice on the ankle to prevent swelling and improve rehabilitation. This may be done for 15 to 20 minutes following your exercises. If exercising is being done in the work place, this may not always be possible.  Taping an ankle injury may be helpful to give added support following an injury. It also may help prevent re-injury. This may be true if you are in training or in a conditioning program. You and your caregiver can decide on the best course of action to follow. Document Released: 05/16/2000 Document Revised: 08/11/2011 Document Reviewed: 05/13/2008 [facility] Patient Information 2013 [place].

## 2012-09-02 ENCOUNTER — Other Ambulatory Visit: Payer: Self-pay | Admitting: *Deleted

## 2012-09-02 DIAGNOSIS — G629 Polyneuropathy, unspecified: Secondary | ICD-10-CM

## 2012-09-02 DIAGNOSIS — I10 Essential (primary) hypertension: Secondary | ICD-10-CM

## 2012-09-02 MED ORDER — GABAPENTIN 300 MG PO CAPS: 300.0000 mg | ORAL_CAPSULE | Freq: Three times a day (TID) | ORAL | Status: DC

## 2012-09-02 MED ORDER — AMLODIPINE BESYLATE 10 MG PO TABS: 10.0000 mg | ORAL_TABLET | Freq: Every day | ORAL | Status: DC

## 2012-09-02 NOTE — Telephone Encounter (Signed)
Gabapentin and Amlodipine rxs called to [facility].

## 2012-09-02 NOTE — Telephone Encounter (Signed)
[facility] has closed.

## 2012-09-09 ENCOUNTER — Encounter: Payer: PRIVATE HEALTH INSURANCE | Admitting: Internal Medicine

## 2012-09-10 ENCOUNTER — Telehealth: Payer: Self-pay | Admitting: *Deleted

## 2012-09-10 ENCOUNTER — Encounter: Payer: Self-pay | Admitting: Internal Medicine

## 2012-09-10 ENCOUNTER — Ambulatory Visit (INDEPENDENT_AMBULATORY_CARE_PROVIDER_SITE_OTHER): Payer: PRIVATE HEALTH INSURANCE | Admitting: Sports Medicine

## 2012-09-10 VITALS — BP 144/80 | Ht 65.0 in | Wt 295.0 lb

## 2012-09-10 DIAGNOSIS — M2140 Flat foot [pes planus] (acquired), unspecified foot: Secondary | ICD-10-CM

## 2012-09-10 DIAGNOSIS — E113293 Type 2 diabetes mellitus with mild nonproliferative diabetic retinopathy without macular edema, bilateral: Secondary | ICD-10-CM | POA: Insufficient documentation

## 2012-09-10 DIAGNOSIS — M2141 Flat foot [pes planus] (acquired), right foot: Secondary | ICD-10-CM

## 2012-09-10 NOTE — Telephone Encounter (Signed)
Pt stated she is waiting for [facility] to call her about Norvasc and Gabapentin refills. I talked to [facility] cannot be refill until the 14th (last refill 06/15/12) and will have samples ready for both meds next week and they will call the pt. Pt called and informed; pt not too happy about the wait.

## 2012-09-10 NOTE — Progress Notes (Signed)
Chief complaint: Right ankle pain  History of present illness: The patient is a very pleasant 62 year old female coming in with a history of right ankle pain for 6 weeks. Patient denies any injury and states that this is more of an insidious onset. Patient states that it seems to be only painful when she is weightbearing. Patient states that the pain is mostly in the medial aspect of the ankle. Patient has seen her primary care physician who did give her exercises which did help minorly. Patient denies any numbness or tingling. Patient describes the pain more as a dull aching sensation with sharp pain from time to time. Patient has tried some anti-inflammatories with moderate improvement.  Past Medical History  Diagnosis Date  . DIABETES MELLITUS, TYPE II 03/17/2006        . HYPERLIPIDEMIA 03/17/2006    Qualifier: Diagnosis of  By: Sharlet Salina MD, [place]    . HYPERTENSION 03/17/2006        . Morbid obesity 01/25/2008    Qualifier: Diagnosis of  By: Phifer MD, Izora Gala      No past surgical history on file.  No family history on file.  History  Substance Use Topics  . Smoking status: Never Smoker   . Smokeless tobacco: Not on file  . Alcohol Use: No    Physical exam Blood pressure 144/80, height 5' 5"  (1.651 m), weight 295 lb (133.811 kg). General: No apparent distress alert and oriented x3 mood and affect normal morbidly obese,  Respiratory: Patient's speak in full sentences and does not appear short of breath Skin: Warm dry intact with no signs of infection or rash Neuro: Cranial nerves II through XII are intact, neurovascularly intact in all extremities with 2+ DTRs and 2+ pulses. Foot exam: Bilaterally patient does have significant pes planus as well as breakdown of the transverse arches. Patient hindfoot though does have more of a supination. Patient has good range of motion of her first toes bilaterally. Patient does have moderately tight Achilles bilaterally. On palpation patient is  nontender over the medial and lateral malleolus bilaterally. Patient does appear to have any pain with the plantar fascia. Gait analysis: Patient does have significant supination of the hindfoot bilaterally right greater than left patient is not go through the pushoff phase on the right side. Patient has a hindfoot strike with a midfoot push off on the right side.

## 2012-09-10 NOTE — Assessment & Plan Note (Signed)
Patient's problem is likely secondary to her morbid obesity and her pes planus. I do not find anything that is consistent with plantar fasciitis. At this time patient was given arch traps as well as lateral heel wedges to try to help with the severe supination of the hindfoot. This did seem to help patient's symptoms immediately. Patient will continue with these interventions and was given home exercises that she will do on a regular basis. Patient and will followup again in 4 weeks for further evaluation. At that time she continues to have pain would consider doing the mesial skeletal ultrasound as well as potentially making sports insoles.

## 2012-10-12 ENCOUNTER — Encounter: Payer: Self-pay | Admitting: Dietician

## 2012-11-02 ENCOUNTER — Other Ambulatory Visit: Payer: Self-pay | Admitting: *Deleted

## 2012-11-02 DIAGNOSIS — E119 Type 2 diabetes mellitus without complications: Secondary | ICD-10-CM

## 2012-11-02 MED ORDER — METFORMIN HCL 1000 MG PO TABS: 1000.0000 mg | ORAL_TABLET | Freq: Two times a day (BID) | ORAL | Status: DC

## 2012-11-18 ENCOUNTER — Ambulatory Visit: Payer: Self-pay

## 2012-11-29 NOTE — Assessment & Plan Note (Addendum)
She states that she has been taking her metformin and glipizide as prescribed.  She denies polyuria and polydipsia.   Hemoglobin A1C (%)  Date Value  08/30/2012 7.5   11/30/2009 7.8   A1C today is very close to her goal of <7.0.  We discussed dietary changes that she could use to get under her goal.  We performed her foot exam today and referred her for the retinal camera.

## 2012-11-29 NOTE — Assessment & Plan Note (Signed)
She has tendonitis in her right ankle.  We will work to get her over to sports medicine for rehab.  We will also start naproxen with Ranitidine for stomach protection.  She was also encouraged to use ice on the ankle 20 minutes at a time several times daily for the pain.

## 2012-11-29 NOTE — Assessment & Plan Note (Signed)
She has a history of GERD and is doing well on her ranitidine PRN.  With the addition of Naproxen for her acute ankle pain we will ensure that she takes it every day while she is on the naproxen.

## 2012-12-09 ENCOUNTER — Other Ambulatory Visit: Payer: Self-pay

## 2012-12-14 ENCOUNTER — Other Ambulatory Visit: Payer: Self-pay | Admitting: *Deleted

## 2012-12-14 DIAGNOSIS — E119 Type 2 diabetes mellitus without complications: Secondary | ICD-10-CM

## 2012-12-14 MED ORDER — GLIPIZIDE 10 MG PO TABS: 10.0000 mg | ORAL_TABLET | Freq: Two times a day (BID) | ORAL | Status: DC

## 2012-12-14 NOTE — Telephone Encounter (Signed)
There is no e-pharmacy, so phone in please.  Thanks

## 2012-12-15 NOTE — Telephone Encounter (Signed)
Rx faxed in.

## 2013-01-05 ENCOUNTER — Other Ambulatory Visit: Payer: Self-pay

## 2013-01-21 ENCOUNTER — Other Ambulatory Visit: Payer: Self-pay | Admitting: *Deleted

## 2013-01-21 ENCOUNTER — Encounter: Payer: No Typology Code available for payment source | Admitting: Internal Medicine

## 2013-01-21 DIAGNOSIS — I10 Essential (primary) hypertension: Secondary | ICD-10-CM

## 2013-01-21 MED ORDER — CARVEDILOL 25 MG PO TABS: 25.0000 mg | ORAL_TABLET | Freq: Two times a day (BID) | ORAL | Status: DC

## 2013-01-21 NOTE — Telephone Encounter (Signed)
Pt has an appt w/Dr Algis Liming on 9/12.

## 2013-02-04 ENCOUNTER — Ambulatory Visit (INDEPENDENT_AMBULATORY_CARE_PROVIDER_SITE_OTHER): Payer: No Typology Code available for payment source | Admitting: Internal Medicine

## 2013-02-04 ENCOUNTER — Encounter: Payer: Self-pay | Admitting: Internal Medicine

## 2013-02-04 VITALS — BP 170/92 | HR 85 | Temp 97.0°F | Ht 65.0 in | Wt 294.6 lb

## 2013-02-04 DIAGNOSIS — E1139 Type 2 diabetes mellitus with other diabetic ophthalmic complication: Secondary | ICD-10-CM

## 2013-02-04 DIAGNOSIS — H579 Unspecified disorder of eye and adnexa: Secondary | ICD-10-CM

## 2013-02-04 DIAGNOSIS — L304 Erythema intertrigo: Secondary | ICD-10-CM | POA: Insufficient documentation

## 2013-02-04 DIAGNOSIS — I10 Essential (primary) hypertension: Secondary | ICD-10-CM

## 2013-02-04 DIAGNOSIS — B372 Candidiasis of skin and nail: Secondary | ICD-10-CM | POA: Insufficient documentation

## 2013-02-04 DIAGNOSIS — Z23 Encounter for immunization: Secondary | ICD-10-CM

## 2013-02-04 MED ORDER — LISINOPRIL-HYDROCHLOROTHIAZIDE 20-25 MG PO TABS: 2.0000 | ORAL_TABLET | Freq: Every day | ORAL | Status: DC

## 2013-02-04 MED ORDER — NYSTATIN 100000 UNIT/GM EX POWD: CUTANEOUS | Status: DC

## 2013-02-04 MED ORDER — HYDROCORTISONE 1 % EX CREA: TOPICAL_CREAM | CUTANEOUS | Status: DC

## 2013-02-04 MED ORDER — HYDROCORTISONE 1 % EX CREA: TOPICAL_CREAM | CUTANEOUS | Status: AC

## 2013-02-04 NOTE — Assessment & Plan Note (Signed)
Assessment:  The patient's blood pressure is elevated at 170/92 today.  The patient did not increase her lisinopril-HCTZ 20/62m daily to two pill daily as was the plan at the 07/2012 office visit.  She says she was unaware that she was supposed to increase the amount.   She voiced understanding of the change today and says she will pick up the new prescription.  Plan:    1) Increase lisinopril-HCTZ 20/267mto 40/5029m   2) Continue amlodipine 52m76mily and Coreg 25mg37m  3) Return to clinic in 1 month to check BP and BMP.

## 2013-02-04 NOTE — Assessment & Plan Note (Signed)
Assessment:  Rash beneath breast consistent with intertriginous candidiasis.    Plan:    1) Nystatin powder - apply small amount to affected area 2-3 times daily.  2) Hydrocortisone cream - apply small amount to affected area 2-3 times daily prn for itching.    3) Wear loose fitting garments.

## 2013-02-04 NOTE — Assessment & Plan Note (Addendum)
Assessment:  HgbA1c 7.2 today, improved from 7.5 in 07/2012.  Patient reports compliance with metformin 1027m BID and glipizide 177mBID.  Plan:    1) Continue current therapy  2) Increase physical activity

## 2013-02-04 NOTE — Patient Instructions (Addendum)
General Instructions: 1. You have been prescribed Nystatin cream/powder and hydrocortisone cream for your rash.  Apply a small amount of Nystatin to the rash 2-3 times per day.  If needed you can also apply a small amount of hydrocortisone cream to the affected area 2-3 times per day to help reduce itching.  I have increased your lisinopril-HCTZ 20/3m to 40/54m  You should now take 2 tablets of lisinopril-HCTZ daily instead of 1 tablet daily.  Please return to clinic in 1 month for blood pressure check.  Return to clinic sooner if your rash does not improve within 1 week or if you develop new symptoms.  2. Please take all medications as prescribed.   3. If you have worsening of your symptoms or new symptoms arise, please call the clinic [phone] or go to the ER immediately if symptoms are severe.    Treatment Goals:  Goals (1 Years of Data) as of 02/04/13         As of Today 09/10/12 08/30/12 07/12/12 07/12/12     Blood Pressure    . Blood Pressure < 140/90  170/92 144/80  141/85 177/100     Result Component    . HEMOGLOBIN A1C < 7.0  7.2  7.5      . LDL CALC < 100            Progress Toward Treatment Goals:  Treatment Goal 02/04/2013  Hemoglobin A1C improved  Blood pressure unchanged    Self Care Goals & Plans:  Self Care Goal 02/04/2013  Manage my medications take my medicines as prescribed; bring my medications to every visit; refill my medications on time  Monitor my health -  Eat healthy foods drink diet soda or water instead of juice or soda; eat more vegetables; eat foods that are low in salt; eat baked foods instead of fried foods; eat fruit for snacks and desserts  Be physically active find an activity I enjoy  Meeting treatment goals maintain the current self-care plan    Home Blood Glucose Monitoring 02/04/2013  Check my blood sugar no home glucose monitoring    Diabetes and Exercise Regular exercise is important and can help:   Control blood glucose  (sugar).  Decrease blood pressure.    Control blood lipids (cholesterol, triglycerides).  Improve overall health. BENEFITS FROM EXERCISE  Improved fitness.  Improved flexibility.  Improved endurance.  Increased bone density.  Weight control.  Increased muscle strength.  Decreased body fat.  Improvement of the body's use of insulin, a hormone.  Increased insulin sensitivity.  Reduction of insulin needs.  Reduced stress and tension.  Helps you feel better. People with diabetes who add exercise to their lifestyle gain additional benefits, including:  Weight loss.  Reduced appetite.  Improvement of the body's use of blood glucose.  Decreased risk factors for heart disease:  Lowering of cholesterol and triglycerides.  Raising the level of good cholesterol (high-density lipoproteins, HDL).  Lowering blood sugar.  Decreased blood pressure. TYPE 1 DIABETES AND EXERCISE  Exercise will usually lower your blood glucose.  If blood glucose is greater than 240 mg/dl, check urine ketones. If ketones are present, do not exercise.  Location of the insulin injection sites may need to be adjusted with exercise. Avoid injecting insulin into areas of the body that will be exercised. For example, avoid injecting insulin into:  The arms when playing tennis.  The legs when jogging. For more information, discuss this with your caregiver.  Keep a record of:  Food intake.  Type and amount of exercise.  Expected peak times of insulin action.  Blood glucose levels. Do this before, during, and after exercise. Review your records with your caregiver. This will help you to develop guidelines for adjusting food intake and insulin amounts.  TYPE 2 DIABETES AND EXERCISE  Regular physical activity can help control blood glucose.  Exercise is important because it may:  Increase the body's sensitivity to insulin.  Improve blood glucose control.  Exercise reduces the risk  of heart disease. It decreases serum cholesterol and triglycerides. It also lowers blood pressure.  Those who take insulin or oral hypoglycemic agents should watch for signs of hypoglycemia. These signs include dizziness, shaking, sweating, chills, and confusion.  Body water is lost during exercise. It must be replaced. This will help to avoid loss of body fluids (dehydration) or heat stroke. Be sure to talk to your caregiver before starting an exercise program to make sure it is safe for you. Remember, any activity is better than none.  Document Released: 08/09/2003 Document Revised: 08/11/2011 Document Reviewed: 11/23/2008 [employer] Patient Information 2014 [place], [place].  Diabetes Meal Planning Guide The diabetes meal planning guide is a tool to help you plan your meals and snacks. It is important for people with diabetes to manage their blood glucose (sugar) levels. Choosing the right foods and the right amounts throughout your day will help control your blood glucose. Eating right can even help you improve your blood pressure and reach or maintain a healthy weight. CARBOHYDRATE COUNTING MADE EASY When you eat carbohydrates, they turn to sugar. This raises your blood glucose level. Counting carbohydrates can help you control this level so you feel better. When you plan your meals by counting carbohydrates, you can have more flexibility in what you eat and balance your medicine with your food intake. Carbohydrate counting simply means adding up the total amount of carbohydrate grams in your meals and snacks. Try to eat about the same amount at each meal. Foods with carbohydrates are listed below. Each portion below is 1 carbohydrate serving or 15 grams of carbohydrates. Ask your dietician how many grams of carbohydrates you should eat at each meal or snack. Grains and Starches  1 slice bread.   English muffin or hotdog/hamburger bun.   cup cold cereal (unsweetened).   cup cooked pasta or  rice.   cup starchy vegetables (corn, potatoes, peas, beans, winter squash).  1 tortilla (6 inches).   bagel.  1 waffle or pancake (size of a CD).   cup cooked cereal.  4 to 6 small crackers. *Whole grain is recommended. Fruit  1 cup fresh unsweetened berries, melon, papaya, pineapple.  1 small fresh fruit.   banana or mango.   cup fruit juice (4 oz unsweetened).   cup canned fruit in natural juice or water.  2 tbs dried fruit.  12 to 15 grapes or cherries. Milk and Yogurt  1 cup fat-free or 1% milk.  1 cup soy milk.  6 oz light yogurt with sugar-free sweetener.  6 oz low-fat soy yogurt.  6 oz plain yogurt. Vegetables  1 cup raw or  cup cooked is counted as 0 carbohydrates or a "free" food.  If you eat 3 or more servings at 1 meal, count them as 1 carbohydrate serving. Other Carbohydrates   oz chips or pretzels.   cup ice cream or frozen yogurt.   cup sherbet or sorbet.  2 inch square cake, no frosting.  1 tbs honey, sugar,  jam, jelly, or syrup.  2 small cookies.  3 squares of graham crackers.  3 cups popcorn.  6 crackers.  1 cup broth-based soup.  Count 1 cup casserole or other mixed foods as 2 carbohydrate servings.  Foods with less than 20 calories in a serving may be counted as 0 carbohydrates or a "free" food. You may want to purchase a book or computer software that lists the carbohydrate gram counts of different foods. In addition, the nutrition facts panel on the labels of the foods you eat are a good source of this information. The label will tell you how big the serving size is and the total number of carbohydrate grams you will be eating per serving. Divide this number by 15 to obtain the number of carbohydrate servings in a portion. Remember, 1 carbohydrate serving equals 15 grams of carbohydrate. SERVING SIZES Measuring foods and serving sizes helps you make sure you are getting the right amount of food. The list below  tells how big or small some common serving sizes are.  1 oz.........4 stacked dice.  3 oz........Marland KitchenDeck of cards.  1 tsp.......Marland KitchenTip of little finger.  1 tbs......Marland KitchenMarland KitchenThumb.  2 tbs.......Marland KitchenGolf ball.   cup......Marland KitchenHalf of a fist.  1 cup.......Marland KitchenA fist. SAMPLE DIABETES MEAL PLAN Below is a sample meal plan that includes foods from the grain and starches, dairy, vegetable, fruit, and meat groups. A dietician can individualize a meal plan to fit your calorie needs and tell you the number of servings needed from each food group. However, controlling the total amount of carbohydrates in your meal or snack is more important than making sure you include all of the food groups at every meal. You may interchange carbohydrate containing foods (dairy, starches, and fruits). The meal plan below is an example of a 2000 calorie diet using carbohydrate counting. This meal plan has 17 carbohydrate servings. Breakfast  1 cup oatmeal (2 carb servings).   cup light yogurt (1 carb serving).  1 cup blueberries (1 carb serving).   cup almonds. Snack  1 large apple (2 carb servings).  1 low-fat string cheese stick. Lunch  Chicken breast salad.  1 cup spinach.   cup chopped tomatoes.  2 oz chicken breast, sliced.  2 tbs low-fat [place] dressing.  12 whole-wheat crackers (2 carb servings).  12 to 15 grapes (1 carb serving).  1 cup low-fat milk (1 carb serving). Snack  1 cup carrots.   cup hummus (1 carb serving). Dinner  3 oz broiled salmon.  1 cup brown rice (3 carb servings). Snack  1  cups steamed broccoli (1 carb serving) drizzled with 1 tsp olive oil and lemon juice.  1 cup light pudding (2 carb servings). DIABETES MEAL PLANNING WORKSHEET Your dietician can use this worksheet to help you decide how many servings of foods and what types of foods are right for you.  BREAKFAST Food Group and Servings / Carb Servings Grain/Starches __________________________________ Dairy  __________________________________________ Vegetable ______________________________________ Fruit ___________________________________________ Meat __________________________________________ Fat ____________________________________________ LUNCH Food Group and Servings / Carb Servings Grain/Starches ___________________________________ Dairy ___________________________________________ Fruit ____________________________________________ Meat ___________________________________________ Fat _____________________________________________ W[employer] Food Group and Servings / Carb Servings Grain/Starches ___________________________________ Dairy ___________________________________________ Fruit ____________________________________________ Meat ___________________________________________ Fat _____________________________________________ SNACKS Food Group and Servings / Carb Servings Grain/Starches ___________________________________ Dairy ___________________________________________ Vegetable _______________________________________ Fruit ____________________________________________ Meat ___________________________________________ Fat _____________________________________________ DAILY TOTALS Starches _________________________ Vegetable ________________________ Fruit ____________________________ Dairy ____________________________ Meat ____________________________ Fat ______________________________ Document Released: 02/13/2005 Document Revised: 08/11/2011 Document Reviewed: 12/25/2008 [employer] Patient Information 2014 [place] [place].  Cholesterol Cholesterol is a white, waxy, fat-like protein needed by your body in small amounts. The liver makes all the cholesterol you need. It is carried from the liver by the blood through the blood vessels. Deposits (plaque) may build up on blood vessel walls. This makes the arteries narrower and stiffer. Plaque increases the risk for heart attack and  stroke. You cannot feel your cholesterol level even if it is very high. The only way to know is by a blood test to check your lipid (fats) levels. Once you know your cholesterol levels, you should keep a record of the test results. Work with your caregiver to to keep your levels in the desired range. WHAT THE RESULTS MEAN:  Total cholesterol is a rough measure of all the cholesterol in your blood.  LDL is the so-called bad cholesterol. This is the type that deposits cholesterol in the walls of the arteries. You want this level to be low.  HDL is the good cholesterol because it cleans the arteries and carries the LDL away. You want this level to be high.  Triglycerides are fat that the body can either burn for energy or store. High levels are closely linked to heart disease. DESIRED LEVELS:  Total cholesterol below 200.  LDL below 100 for people at risk, below 70 for very high risk.  HDL above 50 is good, above 60 is best.  Triglycerides below 150. HOW TO LOWER YOUR CHOLESTEROL:  Diet.  Choose fish or white meat chicken and [place], roasted or baked. Limit fatty cuts of red meat, fried foods, and processed meats, such as sausage and lunch meat.  Eat lots of fresh fruits and vegetables. Choose whole grains, beans, pasta, potatoes and cereals.  Use only small amounts of olive, corn or canola oils. Avoid butter, mayonnaise, shortening or palm kernel oils. Avoid foods with trans-fats.  Use skim/nonfat milk and low-fat/nonfat yogurt and cheeses. Avoid whole milk, cream, ice cream, egg yolks and cheeses. Healthy desserts include angel food cake, ginger snaps, animal crackers, hard candy, popsicles, and low-fat/nonfat frozen yogurt. Avoid pastries, cakes, pies and cookies.  Exercise.  A regular program helps decrease LDL and raises HDL.  Helps with weight control.  Do things that increase your activity level like gardening, walking, or taking the stairs.  Medication.  May be  prescribed by your caregiver to help lowering cholesterol and the risk for heart disease.  You may need medicine even if your levels are normal if you have several risk factors. HOME CARE INSTRUCTIONS   Follow your diet and exercise programs as suggested by your caregiver.  Take medications as directed.  Have blood work done when your caregiver feels it is necessary. MAKE SURE YOU:   Understand these instructions.  Will watch your condition.  Will get help right away if you are not doing well or get worse. Document Released: 02/11/2001 Document Revised: 08/11/2011 Document Reviewed: 08/04/2007 [facility] Patient Information 2014 [place], [place].

## 2013-02-04 NOTE — Progress Notes (Signed)
  Subjective:    Patient ID: Sonya Lane, female    DOB: [birth year], 62 y.o.   MRN: [id number]  HPI Comments: Sonya Lane is a 62 year old female with a PMH of DM type 2 with retinopathy and peripheral neuropathy, HTN and GERD.  She presents with 2 month history of pruritic rash beneath both breasts.  The rash initially began under the left breast and is now affecting both areas.  The rash has not responded to topical Benadryl.  She has had no new environmental exposures.  She has had rash in this area before during the summer months.    Sonya Lane is also here for follow-up of her DM type 2.  Her Hgb A1c is 7.2, an improvement from 7.5 six months ago.  She reports compliance with her metformin 1054m BID and glipizide 135mBID.  Rash Pertinent negatives include no fatigue, fever or shortness of breath.      Review of Systems  Constitutional: Negative for fever, chills, appetite change and fatigue.  Respiratory: Negative for shortness of breath.   Cardiovascular: Negative for chest pain.  Gastrointestinal: Positive for constipation.  Genitourinary: Negative for dysuria.  Skin: Positive for rash.  Neurological: Negative for syncope and weakness.       Objective:   Physical Exam  Vitals reviewed. Constitutional: She is oriented to person, place, and time. She appears well-developed and well-nourished. No distress.  HENT:  Head: Normocephalic and atraumatic.  Mouth/Throat: Oropharynx is clear and moist. No oropharyngeal exudate.  Eyes: Conjunctivae and EOM are normal. Pupils are equal, round, and reactive to light. No scleral icterus.  Neck: Neck supple.  Cardiovascular: Normal rate, regular rhythm and normal heart sounds.   No murmur heard. Pulmonary/Chest: Effort normal and breath sounds normal. No respiratory distress. She has no wheezes. She has no rales.  Abdominal: Soft. She exhibits no distension. There is no tenderness.  Musculoskeletal: She exhibits no tenderness.   Neurological: She is alert and oriented to person, place, and time. No cranial nerve deficit.  Skin: Skin is warm. Rash noted. She is not diaphoretic.  + reddish/brown rash beneath both breasts   Psychiatric: She has a normal mood and affect. Her behavior is normal.          Assessment & Plan:  Please see problem based charting.

## 2013-02-05 NOTE — Progress Notes (Signed)
I saw and evaluated the patient.  I personally confirmed the key portions of the history and exam documented by Dr. Wilson and I reviewed pertinent patient test results.  The assessment, diagnosis, and plan were formulated together and I agree with the documentation in the resident's note. 

## 2013-02-11 ENCOUNTER — Encounter: Payer: No Typology Code available for payment source | Admitting: Internal Medicine

## 2013-03-07 ENCOUNTER — Encounter: Payer: Self-pay | Admitting: Internal Medicine

## 2013-03-07 ENCOUNTER — Ambulatory Visit (INDEPENDENT_AMBULATORY_CARE_PROVIDER_SITE_OTHER): Payer: No Typology Code available for payment source | Admitting: Internal Medicine

## 2013-03-07 VITALS — BP 138/79 | HR 86 | Temp 97.3°F | Ht 65.0 in | Wt 293.5 lb

## 2013-03-07 DIAGNOSIS — R141 Gas pain: Secondary | ICD-10-CM

## 2013-03-07 DIAGNOSIS — I10 Essential (primary) hypertension: Secondary | ICD-10-CM

## 2013-03-07 DIAGNOSIS — R140 Abdominal distension (gaseous): Secondary | ICD-10-CM

## 2013-03-07 DIAGNOSIS — K219 Gastro-esophageal reflux disease without esophagitis: Secondary | ICD-10-CM

## 2013-03-07 LAB — COMPLETE METABOLIC PANEL WITH GFR
Albumin: 4.1 g/dL (ref 3.5–5.2)
BUN: 11 mg/dL (ref 6–23)
Calcium: 9.9 mg/dL (ref 8.4–10.5)
Chloride: 97 mEq/L (ref 96–112)
Creat: 0.85 mg/dL (ref 0.50–1.10)
GFR, Est African American: 84 mL/min
GFR, Est Non African American: 73 mL/min
Glucose, Bld: 248 mg/dL — ABNORMAL HIGH (ref 70–99)
Potassium: 4 mEq/L (ref 3.5–5.3)

## 2013-03-07 MED ORDER — SUCRALFATE 1 G PO TABS: 1.0000 g | ORAL_TABLET | Freq: Four times a day (QID) | ORAL | Status: DC

## 2013-03-07 MED ORDER — LISINOPRIL-HYDROCHLOROTHIAZIDE 20-25 MG PO TABS: 1.0000 | ORAL_TABLET | Freq: Every day | ORAL | Status: DC

## 2013-03-07 NOTE — Assessment & Plan Note (Signed)
BP Readings from Last 3 Encounters:  03/07/13 138/79  02/04/13 170/92  09/10/12 144/80    Lab Results  Component Value Date   NA 137 07/12/2012   K 4.1 07/12/2012   CREATININE 0.84 07/12/2012    Assessment: Blood pressure control: controlled Progress toward BP goal:  at goal Comments: The patient self-decreased her lisinopril hydrochlorothiazide back to her old dose of 20-25. However her blood pressure is well controlled today, surprisingly.  Plan: Medications:  Continue amlodipine 10 and Coreg 25 twice a day. Decrease lisinopril hydrochlorothiazide back to 20-25 milligrams daily Educational resources provided:   Self management tools provided:   Other plans: Pt to keep BP log at home, and report home values at next visit

## 2013-03-07 NOTE — Progress Notes (Signed)
Case discussed with Dr. Brown at the time of the visit.  We reviewed the resident's history and exam and pertinent patient test results.  I agree with the assessment, diagnosis, and plan of care documented in the resident's note. 

## 2013-03-07 NOTE — Patient Instructions (Addendum)
General Instructions: Your blood pressure is well-controlled today.  Continue your current medications.  The small lumps on your abdomen likely represent Lipomas.  Please see information below.  For your abdominal discomfort, try taking Carafate, 1 tablet four times per day, for the next 4 weeks.  This will decrease any inflammation in the stomach that may be leading to discomfort.  We are also checking some lab work today to evaluate the cause of this dysfunction.  Please return for a follow-up visit in 3 months.   Treatment Goals:  Goals (1 Years of Data) as of 03/07/13         As of Today 02/04/13 09/10/12 08/30/12 07/12/12     Blood Pressure    . Blood Pressure < 140/90  138/79 170/92 144/80  141/85     Result Component    . HEMOGLOBIN A1C < 7.0   7.2  7.5     . LDL CALC < 100            Progress Toward Treatment Goals:  Treatment Goal 03/07/2013  Hemoglobin A1C at goal  Blood pressure at goal    Self Care Goals & Plans:  Self Care Goal 03/07/2013  Manage my medications take my medicines as prescribed; refill my medications on time; bring my medications to every visit  Monitor my health -  Eat healthy foods eat foods that are low in salt; eat baked foods instead of fried foods  Be physically active find an activity I enjoy  Meeting treatment goals -    Home Blood Glucose Monitoring 02/04/2013  Check my blood sugar no home glucose monitoring     Care Management & Community Referrals:  Referral 03/07/2013  Referrals made for care management support none needed  Referrals made to community resources weight management      Lipoma A lipoma is a noncancerous (benign) tumor composed of fat cells. They are usually found under the skin (subcutaneous). A lipoma may occur in any tissue of the body that contains fat. Common areas for lipomas to appear include the back, shoulders, buttocks, and thighs. Lipomas are a very common soft tissue growth. They are soft and grow slowly.  Most problems caused by a lipoma depend on where it is growing. DIAGNOSIS  A lipoma can be diagnosed with a physical exam. These tumors rarely become cancerous, but radiographic studies can help determine this for certain. Studies used may include:  Computerized X-ray scans (CT or CAT scan).  Computerized magnetic scans (MRI). TREATMENT  Small lipomas that are not causing problems may be watched. If a lipoma continues to enlarge or causes problems, removal is often the best treatment. Lipomas can also be removed to improve appearance. Surgery is done to remove the fatty cells and the surrounding capsule. Most often, this is done with medicine that numbs the area (local anesthetic). The removed tissue is examined under a microscope to make sure it is not cancerous. Keep all follow-up appointments with your caregiver. SEEK MEDICAL CARE IF:   The lipoma becomes larger or hard.  The lipoma becomes painful, red, or increasingly swollen. These could be signs of infection or a more serious condition. Document Released: 05/09/2002 Document Revised: 08/11/2011 Document Reviewed: 10/19/2009 [facility] Patient Information 2014 [place], [place].

## 2013-03-07 NOTE — Assessment & Plan Note (Signed)
The patient notes symptoms of abdominal bloating, without significant pain, which tend to be worse after eating. The patient is status post hysterectomy. Symptoms may represent gastritis versus gallbladder pathology. 2 subcutaneous nodules are felt on her abdomen, which is thought represent lipomas, and likely are noncontributing to her current symptoms. -check CMP -try a 31-monthcourse of carate to decrease any gastritis -Consider CT abdomen in the future

## 2013-03-07 NOTE — Progress Notes (Signed)
HPI The patient is a 62 y.o. female with a history of diabetes, hypertension, GERD, coming in for a routine followup visit.  The patient presents for a BP re-check.   At the patient's last visit, lisinopril-HCTZ was doubled to 40-50 mg daily.  The patient experienced a "funny taste" in her mouth, so she went back to her previous dose.  She notes no difficulty breathing, airway swelling, tongue swelling. Symptoms fully resolved on her previous dose of lisinopril hydrochlorothiazide.  The patient had a total hysterectomy in 1997, due to fibroids.  The patient now notes a 1-year history of stomach discomfort, described as bilateral upper abd "cramping" sensation, which typically occurs after eating food. She notes that the type of food or quantity of food does not seem to make a difference, and she notes that she does not eat a lot of fatty foods. She takes ranitidine, and an over-the-counter gas medication, but does not note much relief.  She notes no diarrhea or constipation, and no nausea/vomiting.  No change in weight.  ROS: General: no fevers, chills Skin: no rash HEENT: no blurry vision, hearing changes, sore throat Pulm: no dyspnea, coughing, wheezing CV: no chest pain, palpitations, shortness of breath Abd: see HPI GU: no dysuria, hematuria, polyuria Ext: no arthralgias, myalgias Neuro: no weakness, numbness, or tingling  Filed Vitals:   03/07/13 1038  BP: 138/79  Pulse: 86  Temp: 97.3 F (36.3 C)    PEX General: alert, cooperative, and in no apparent distress HEENT: pupils equal round and reactive to light, vision grossly intact, oropharynx clear and non-erythematous  Neck: supple, no lymphadenopathy Lungs: clear to ascultation bilaterally, normal work of respiration, no wheezes, rales, ronchi Heart: regular rate and rhythm, no murmurs, gallops, or rubs Abdomen: soft, two small subcutaneous nodules felt on her right upper quadrant and left lateral abdomen. Mild discomfort  elicited to deep palpation of right upper quadrant. No distention, no guarding or rebound tenderness, bowel sounds present. Extremities: no cyanosis, clubbing, or edema Neurologic: alert & oriented X3, cranial nerves II-XII intact, strength grossly intact, sensation intact to light touch  Current Outpatient Prescriptions on File Prior to Visit  Medication Sig Dispense Refill  . amLODipine (NORVASC) 10 MG tablet Take 1 tablet (10 mg total) by mouth daily.  30 tablet  5  . aspirin 81 MG tablet Take 81 mg by mouth daily.        . carvedilol (COREG) 25 MG tablet Take 1 tablet (25 mg total) by mouth 2 (two) times daily with a meal.  180 tablet  3  . cyclobenzaprine (FLEXERIL) 5 MG tablet Take 5 mg by mouth at bedtime. For muscle pain/spasm       . gabapentin (NEURONTIN) 300 MG capsule Take 1 capsule (300 mg total) by mouth 3 (three) times daily.  90 capsule  5  . glipiZIDE (GLUCOTROL) 10 MG tablet Take 1 tablet (10 mg total) by mouth 2 (two) times daily before a meal.  180 tablet  3  . hydrocortisone cream 1 % Apply to affected area 2-3 times daily  30 g  0  . lisinopril-hydrochlorothiazide (PRINZIDE,ZESTORETIC) 20-25 MG per tablet Take 2 tablets by mouth daily.  60 tablet  1  . metFORMIN (GLUCOPHAGE) 1000 MG tablet Take 1 tablet (1,000 mg total) by mouth 2 (two) times daily with a meal.  180 tablet  3  . Multiple Vitamin (MULTIVITAMIN) tablet Take 1 tablet by mouth daily.        . naproxen (NAPROSYN) 250  MG tablet Take 1 tablet (250 mg total) by mouth 2 (two) times daily with a meal.  60 tablet  1  . nystatin (MYCOSTATIN/NYSTOP) 100000 UNIT/GM POWD Apply to affected area 2-3 times daily.  1 Bottle  1  . pravastatin (PRAVACHOL) 20 MG tablet Take one tab two times per week (Monday and Friday)  30 tablet  6  . ranitidine (ZANTAC) 150 MG tablet Take 1 tablet (150 mg total) by mouth 2 (two) times daily as needed for heartburn.  60 tablet  3   No current facility-administered medications on file prior to  visit.    Assessment/Plan

## 2013-03-11 ENCOUNTER — Other Ambulatory Visit: Payer: Self-pay | Admitting: *Deleted

## 2013-03-11 DIAGNOSIS — I10 Essential (primary) hypertension: Secondary | ICD-10-CM

## 2013-03-11 DIAGNOSIS — G629 Polyneuropathy, unspecified: Secondary | ICD-10-CM

## 2013-03-11 MED ORDER — GABAPENTIN 300 MG PO CAPS: 300.0000 mg | ORAL_CAPSULE | Freq: Three times a day (TID) | ORAL | Status: DC

## 2013-03-11 MED ORDER — AMLODIPINE BESYLATE 10 MG PO TABS: 10.0000 mg | ORAL_TABLET | Freq: Every day | ORAL | Status: DC

## 2013-05-23 ENCOUNTER — Ambulatory Visit: Payer: No Typology Code available for payment source

## 2013-06-10 ENCOUNTER — Encounter: Payer: Self-pay | Admitting: Internal Medicine

## 2013-06-10 ENCOUNTER — Ambulatory Visit (INDEPENDENT_AMBULATORY_CARE_PROVIDER_SITE_OTHER): Payer: No Typology Code available for payment source | Admitting: Internal Medicine

## 2013-06-10 VITALS — BP 131/76 | HR 78 | Temp 98.1°F | Wt 291.2 lb

## 2013-06-10 DIAGNOSIS — IMO0002 Reserved for concepts with insufficient information to code with codable children: Secondary | ICD-10-CM

## 2013-06-10 DIAGNOSIS — R740 Nonspecific elevation of levels of transaminase and lactic acid dehydrogenase [LDH]: Secondary | ICD-10-CM

## 2013-06-10 DIAGNOSIS — K7689 Other specified diseases of liver: Secondary | ICD-10-CM

## 2013-06-10 DIAGNOSIS — E1165 Type 2 diabetes mellitus with hyperglycemia: Principal | ICD-10-CM

## 2013-06-10 DIAGNOSIS — Z0000 Encounter for general adult medical examination without abnormal findings: Secondary | ICD-10-CM

## 2013-06-10 DIAGNOSIS — E1139 Type 2 diabetes mellitus with other diabetic ophthalmic complication: Secondary | ICD-10-CM

## 2013-06-10 DIAGNOSIS — E785 Hyperlipidemia, unspecified: Secondary | ICD-10-CM

## 2013-06-10 DIAGNOSIS — R7402 Elevation of levels of lactic acid dehydrogenase (LDH): Secondary | ICD-10-CM

## 2013-06-10 DIAGNOSIS — I10 Essential (primary) hypertension: Secondary | ICD-10-CM

## 2013-06-10 DIAGNOSIS — E119 Type 2 diabetes mellitus without complications: Secondary | ICD-10-CM

## 2013-06-10 DIAGNOSIS — B372 Candidiasis of skin and nail: Secondary | ICD-10-CM

## 2013-06-10 DIAGNOSIS — B370 Candidal stomatitis: Secondary | ICD-10-CM

## 2013-06-10 LAB — LIPID PANEL
CHOL/HDL RATIO: 4.1 ratio
Cholesterol: 214 mg/dL — ABNORMAL HIGH (ref 0–200)
HDL: 52 mg/dL (ref >39)
LDL Cholesterol: 132 mg/dL — ABNORMAL HIGH (ref 0–99)
Triglycerides: 149 mg/dL (ref <150)
VLDL: 30 mg/dL (ref 0–40)

## 2013-06-10 LAB — COMPLETE METABOLIC PANEL WITH GFR
ALBUMIN: 4.1 g/dL (ref 3.5–5.2)
ALK PHOS: 66 U/L (ref 39–117)
ALT: 125 U/L — ABNORMAL HIGH (ref 0–35)
AST: 95 U/L — AB (ref 0–37)
BUN: 9 mg/dL (ref 6–23)
CO2: 29 mEq/L (ref 19–32)
Calcium: 9.9 mg/dL (ref 8.4–10.5)
Chloride: 98 mEq/L (ref 96–112)
Creat: 0.78 mg/dL (ref 0.50–1.10)
GFR, Est African American: >89 mL/min
GFR, Est Non African American: 81 mL/min
Glucose, Bld: 139 mg/dL — ABNORMAL HIGH (ref 70–99)
Potassium: 4 mEq/L (ref 3.5–5.3)
SODIUM: 139 meq/L (ref 135–145)
TOTAL PROTEIN: 7.2 g/dL (ref 6.0–8.3)
Total Bilirubin: 0.5 mg/dL (ref 0.3–1.2)

## 2013-06-10 LAB — GLUCOSE, CAPILLARY: Glucose-Capillary: 161 mg/dL — ABNORMAL HIGH (ref 70–99)

## 2013-06-10 LAB — POCT GLYCOSYLATED HEMOGLOBIN (HGB A1C): Hemoglobin A1C: 8

## 2013-06-10 MED ORDER — NYSTATIN 100000 UNIT/ML MT SUSP: 4.0000 mL | Freq: Four times a day (QID) | OROMUCOSAL | Status: DC

## 2013-06-10 MED ORDER — NYSTATIN 100000 UNIT/GM EX CREA: 1.0000 "application " | TOPICAL_CREAM | Freq: Four times a day (QID) | CUTANEOUS | Status: DC

## 2013-06-10 NOTE — Progress Notes (Signed)
Patient ID: Sonya Lane, female   DOB: September 15, 1950, 63 y.o.   MRN: [id number]   Subjective:   Patient ID: Sonya Lane female   DOB: 1950-06-10 63 y.o.   MRN: [id number]  HPI: Sonya Lane is a 63 y.o. very pleasant woman with past medical history of non-insulin Type II DM complicated by retinopathy and neuropathy, hypertension, and hyperlipidemia who presents for routine clinic visit.   Pt with last HbA1c of 7.2 on 02/13/13 that today is 8.0. Pt reports compliance with metformin 1000 mg BID and glipizide 10 mg daily with no reported adverse side effects. She admits she does not check her blood sugar at home. She denies recent hypoglycemic events. She has at baseline blurry vision, peripheral neuropathy, and polydipsia with no polyuria or polyphagia. She does not exercise regularly and tries to follow a diabetic diet with no recent weight change. She denies recent infection or foot injury.   She reports fungal infection under her breasts that comes and goes with mild pruritis that has been ongoing for many months. She applies anti-fungal foot powder to the area and requests anti-fungal cream.   She reports being compliant with her blood pressure medications (amlodipine, lisinopril-HCTZ, and carvedilol) and denies headache,  palpitations, chest pain, or  lightheadedness.      Past Medical History  Diagnosis Date  . DIABETES MELLITUS, TYPE II 03/17/2006        . HYPERLIPIDEMIA 03/17/2006    Qualifier: Diagnosis of  By: Sharlet Salina MD, [place]    . HYPERTENSION 03/17/2006        . Morbid obesity 01/25/2008    Qualifier: Diagnosis of  By: Phifer MD, Izora Gala     Current Outpatient Prescriptions  Medication Sig Dispense Refill  . amLODipine (NORVASC) 10 MG tablet Take 1 tablet (10 mg total) by mouth daily.  30 tablet  5  . aspirin 81 MG tablet Take 81 mg by mouth daily.        . carvedilol (COREG) 25 MG tablet Take 1 tablet (25 mg total) by mouth 2 (two) times daily with a meal.  180  tablet  3  . cyclobenzaprine (FLEXERIL) 5 MG tablet Take 5 mg by mouth at bedtime. For muscle pain/spasm       . gabapentin (NEURONTIN) 300 MG capsule Take 1 capsule (300 mg total) by mouth 3 (three) times daily.  90 capsule  5  . glipiZIDE (GLUCOTROL) 10 MG tablet Take 1 tablet (10 mg total) by mouth 2 (two) times daily before a meal.  180 tablet  3  . hydrocortisone cream 1 % Apply to affected area 2-3 times daily  30 g  0  . lisinopril-hydrochlorothiazide (PRINZIDE,ZESTORETIC) 20-25 MG per tablet Take 1 tablet by mouth daily.  30 tablet  5  . metFORMIN (GLUCOPHAGE) 1000 MG tablet Take 1 tablet (1,000 mg total) by mouth 2 (two) times daily with a meal.  180 tablet  3  . Multiple Vitamin (MULTIVITAMIN) tablet Take 1 tablet by mouth daily.        . naproxen (NAPROSYN) 250 MG tablet Take 1 tablet (250 mg total) by mouth 2 (two) times daily with a meal.  60 tablet  1  . nystatin (MYCOSTATIN) 100000 UNIT/ML suspension Use as directed 4 mLs (400,000 Units total) in the mouth or throat 4 (four) times daily. Retain in mouth as long as possible, use for 48 hrs after symptoms resolve  60 mL  0  . nystatin cream (MYCOSTATIN) Apply 1 application topically  4 (four) times daily. Apply to affected area every 4-6 hours x 10 days  15 g  1  . pravastatin (PRAVACHOL) 20 MG tablet Take one tab two times per week (Monday and Friday)  30 tablet  6  . ranitidine (ZANTAC) 150 MG tablet Take 1 tablet (150 mg total) by mouth 2 (two) times daily as needed for heartburn.  60 tablet  3  . sucralfate (CARAFATE) 1 G tablet Take 1 tablet (1 g total) by mouth 4 (four) times daily.  120 tablet  0   No current facility-administered medications for this visit.   No family history on file. History   Social History  . Marital Status: Single    Spouse Name: N/A    Number of Children: N/A  . Years of Education: 44   Social History Main Topics  . Smoking status: Never Smoker   . Smokeless tobacco: None  . Alcohol Use: No  .  Drug Use: No  . Sexual Activity: None   Other Topics Concern  . None   Social History Narrative   [name]  September 12, 2009 11:11 AM   Financial assistance approved for 40% discount at [facility] and has [facility] card   [employer]  Oct 02, 2009 2:20 PM   Review of Systems: Review of Systems  Constitutional: Negative for fever, chills, weight loss and malaise/fatigue.  HENT: Negative for congestion and sore throat.   Eyes: Positive for blurred vision.  Respiratory: Negative for cough and shortness of breath.   Cardiovascular: Negative for chest pain and leg swelling.  Gastrointestinal: Negative for heartburn, nausea, vomiting, abdominal pain, diarrhea, constipation and blood in stool.  Genitourinary: Negative for dysuria, urgency, frequency and hematuria.  Musculoskeletal: Negative for myalgias.  Skin: Positive for itching (under breasts) and rash (under breasts).  Neurological: Positive for sensory change (chronic peripheral neuropathy). Negative for dizziness, weakness and headaches.  Endo/Heme/Allergies: Positive for polydipsia.    Objective:  Physical Exam: Filed Vitals:   06/10/13 1501 06/10/13 1605  BP: 148/91 131/76  Pulse: 78 78  Temp: 98.1 F (36.7 C)   TempSrc: Oral   Weight: 132.087 kg (291 lb 3.2 oz)   SpO2: 97%     Physical Exam  Constitutional: She is oriented to person, place, and time. She appears well-developed and well-nourished. No distress.  HENT:  Head: Normocephalic and atraumatic.  Right Ear: External ear normal.  Left Ear: External ear normal.  Nose: Nose normal.  Mouth/Throat: Oropharynx is clear and moist. No oropharyngeal exudate.  White patch that is scrapable on lateral side of tongue  Eyes: Conjunctivae and EOM are normal. Pupils are equal, round, and reactive to light. Right eye exhibits no discharge. Left eye exhibits no discharge. No scleral icterus.  Neck: Normal range of motion. Neck supple.  Cardiovascular: Normal rate, regular rhythm  and normal heart sounds.   Pulmonary/Chest: Effort normal and breath sounds normal. No respiratory distress. She has no wheezes. She has no rales. She exhibits no tenderness.  Abdominal: Soft. Bowel sounds are normal. She exhibits no distension. There is no tenderness. There is no rebound and no guarding.  obese  Musculoskeletal: Normal range of motion. She exhibits no edema and no tenderness.  Pes planus  Neurological: She is alert and oriented to person, place, and time. No cranial nerve deficit.  Skin: Skin is warm and dry. Rash (mild erythamatous rash beneath both breasts without induration or warmth) noted. She is not diaphoretic. No erythema. No pallor.  Psychiatric: She has a normal mood and affect. Her behavior is normal. Judgment and thought content normal.    Assessment & Plan:   Please see problem list for problem based assessment and plan

## 2013-06-10 NOTE — Patient Instructions (Signed)
-  Start checking your blood sugar daily  -Apply Nystatin cream to under your breasts -Use nystatin mouth wash  -Will check your labs today -Will see you back in 3 months

## 2013-06-11 LAB — ACUTE HEP PANEL AND HEP B SURFACE AB
HCV Ab: NEGATIVE
HEP B C IGM: NONREACTIVE
HEP B S AG: NEGATIVE
Hep A IgM: NONREACTIVE
Hep B S Ab: NEGATIVE

## 2013-06-14 ENCOUNTER — Telehealth: Payer: Self-pay | Admitting: *Deleted

## 2013-06-14 NOTE — Telephone Encounter (Signed)
Pt called My[facility] soln is $22.00. Pt wants to try another med  [employer] on $4.00 plan. Hilda Blades Luvenia Cranford RN 06/14/13 5:45PM

## 2013-06-19 DIAGNOSIS — K143 Hypertrophy of tongue papillae: Secondary | ICD-10-CM | POA: Insufficient documentation

## 2013-06-19 NOTE — Assessment & Plan Note (Addendum)
Assessment: Pt not on statin therapy due to past history of intolerance with lipid panel on 06/10/13 revealing hypercholesteremia (total and LDL).     Plan:  -LDL 132 not at goal < 100 and total cholesterol 214 not at goal <200  -CMP on 06/10/13 with chronically elevated AST & ALT most likely due to NAFLD -Pt not agreeable to statin therapy at this time, pt encouraged to follow healthy diet and exercise regularly

## 2013-06-19 NOTE — Assessment & Plan Note (Addendum)
Assessment: Pt with painless scrapable white patch on lateral sides of tongue in setting of diabetes most likely due to oral thrush as there is no history of inhaled corticosteroid use or immunocompromise.   Plan: -Trial of nystatin suspension x4 daily until 48hrs after symptoms resolve -Pt declined HIV testing at this time (prior testing on 01/27/12 was negative) -Continue to monitor

## 2013-06-19 NOTE — Assessment & Plan Note (Signed)
Pt received Pneumococcal Polysaccharide-23 on 06/10/13.  Pt declined zoster vaccination at this time on 06/10/13.

## 2013-06-19 NOTE — Assessment & Plan Note (Addendum)
Assessment: Pt with chronically elevated liver enzymes since 2008 with imaging on 03/16/08 with diffusely echogenic liver consistent with fatty infiltration most likely due to non-alcoholic fatty liver disease in setting of metabolic syndrome.   Plan: -Obtain CMP 06/10/13 --> elevated AST & ALT -Obtain acute hepatitis panel 06/10/13 --> negative -Encourage healthy diet and regular exercise for weight loss -Avoid hepatoxic agents  -Continue to monitor

## 2013-06-19 NOTE — Assessment & Plan Note (Addendum)
Assessment: Pt with chronic candidal  infection underneath both breasts in setting of diabetes and obesity.  Plan: -Pt instructed to apply nystatin cream x4 daily to area for 10 days  -Continue to monitor

## 2013-06-19 NOTE — Assessment & Plan Note (Signed)
Assessment: Pt with well-controlled hypertension who is compliant with 4-class antihypertensive therapy who presents with blood pressure of 131/76.  Plan: -BP today 131/76 at goal <140/90 -Continue amlodipine 10 mg daily -Continue lisinopril-HCTZ 20/25 mg daily -Continue carvedilol 25 mg daily -Obtain CMP 06/10/13 --> Na/K and renal function wnl -Continue to monitor blood pressure

## 2013-06-19 NOTE — Assessment & Plan Note (Addendum)
Assessment: Pt with non-insulin Type II DM complicated by retinopathy and neuropathy with last HbA1c of 7.2 on 02/13/13 who is compliant with oral hypoglycemic agents with no recent hypoglycemic events who presents with HbA1c of 8.0 indicating average 62-monthblood glucose of 183 with CBG of 161.     Plan: -HbA1c 8.0 not at goal <7.0, continue metformin 1000 mg BID and glipizide 10 mg BID -BP today 131/76 at goal <140/90, continue anti-hypertensive therapy  -Continue lisinopril 20 mg daily to prevent CKD progression -LDL 132 not at goal <100, however pt intolerant of statin therapy     -Continue gabapentin for peripheral neuropathy -BMI 48.46 not at goal <25 , encourage weight loss -Continue daily aspirin for primary CV prevention  -Pt instructed to check glucose levels and bring meter at next vist

## 2013-06-24 NOTE — Progress Notes (Signed)
Case discussed with Dr. Naaman Plummer soon after the resident saw the patient.  We reviewed the resident's history and exam and pertinent patient test results.  I agree with the assessment, diagnosis, and plan of care documented in the resident's note.

## 2013-09-07 ENCOUNTER — Ambulatory Visit: Payer: No Typology Code available for payment source | Admitting: Internal Medicine

## 2013-09-20 ENCOUNTER — Other Ambulatory Visit: Payer: Self-pay | Admitting: *Deleted

## 2013-09-20 DIAGNOSIS — G629 Polyneuropathy, unspecified: Secondary | ICD-10-CM

## 2013-09-21 MED ORDER — GABAPENTIN 300 MG PO CAPS: 300.0000 mg | ORAL_CAPSULE | Freq: Three times a day (TID) | ORAL | Status: DC

## 2013-09-21 NOTE — Telephone Encounter (Signed)
Rx faxed in.

## 2013-10-10 NOTE — Telephone Encounter (Signed)
Had appt with Dr Hayes Ludwig 09/27/13 - canceled by pt.

## 2013-10-18 ENCOUNTER — Telehealth: Payer: Self-pay | Admitting: *Deleted

## 2013-10-18 NOTE — Telephone Encounter (Signed)
Thank you... I agree with the recommendations

## 2013-10-18 NOTE — Telephone Encounter (Signed)
Pt calls and states she and her family noted this am that 1 sclera looked like it had "busted vein in corner", states it is red in corner, denies pain, h/a, N&V, vision changes. Has appt 5/22 w/ dr rabbani-pcp. She is advised to go to ED if she has N&V, pain in eye, h/a, changes in vision, changes in gait

## 2013-10-21 ENCOUNTER — Ambulatory Visit (INDEPENDENT_AMBULATORY_CARE_PROVIDER_SITE_OTHER): Payer: No Typology Code available for payment source | Admitting: Internal Medicine

## 2013-10-21 ENCOUNTER — Encounter: Payer: Self-pay | Admitting: Internal Medicine

## 2013-10-21 VITALS — BP 130/82 | HR 83 | Temp 97.2°F | Wt 291.0 lb

## 2013-10-21 DIAGNOSIS — B372 Candidiasis of skin and nail: Secondary | ICD-10-CM

## 2013-10-21 DIAGNOSIS — E1149 Type 2 diabetes mellitus with other diabetic neurological complication: Secondary | ICD-10-CM

## 2013-10-21 DIAGNOSIS — M2140 Flat foot [pes planus] (acquired), unspecified foot: Secondary | ICD-10-CM

## 2013-10-21 DIAGNOSIS — H1131 Conjunctival hemorrhage, right eye: Secondary | ICD-10-CM

## 2013-10-21 DIAGNOSIS — K143 Hypertrophy of tongue papillae: Secondary | ICD-10-CM

## 2013-10-21 DIAGNOSIS — E119 Type 2 diabetes mellitus without complications: Secondary | ICD-10-CM

## 2013-10-21 DIAGNOSIS — E1142 Type 2 diabetes mellitus with diabetic polyneuropathy: Secondary | ICD-10-CM

## 2013-10-21 DIAGNOSIS — E1165 Type 2 diabetes mellitus with hyperglycemia: Principal | ICD-10-CM

## 2013-10-21 DIAGNOSIS — G629 Polyneuropathy, unspecified: Secondary | ICD-10-CM

## 2013-10-21 DIAGNOSIS — H1130 Conjunctival hemorrhage, unspecified eye: Secondary | ICD-10-CM

## 2013-10-21 DIAGNOSIS — E1139 Type 2 diabetes mellitus with other diabetic ophthalmic complication: Secondary | ICD-10-CM

## 2013-10-21 DIAGNOSIS — IMO0002 Reserved for concepts with insufficient information to code with codable children: Secondary | ICD-10-CM

## 2013-10-21 DIAGNOSIS — Z0000 Encounter for general adult medical examination without abnormal findings: Secondary | ICD-10-CM

## 2013-10-21 DIAGNOSIS — I10 Essential (primary) hypertension: Secondary | ICD-10-CM

## 2013-10-21 LAB — POCT GLYCOSYLATED HEMOGLOBIN (HGB A1C): Hemoglobin A1C: 7.9

## 2013-10-21 LAB — GLUCOSE, CAPILLARY: Glucose-Capillary: 134 mg/dL — ABNORMAL HIGH (ref 70–99)

## 2013-10-21 MED ORDER — METFORMIN HCL 1000 MG PO TABS: 1000.0000 mg | ORAL_TABLET | Freq: Two times a day (BID) | ORAL | Status: DC

## 2013-10-21 MED ORDER — GABAPENTIN 300 MG PO CAPS: 300.0000 mg | ORAL_CAPSULE | Freq: Three times a day (TID) | ORAL | Status: DC

## 2013-10-21 MED ORDER — LISINOPRIL-HYDROCHLOROTHIAZIDE 20-25 MG PO TABS: 1.0000 | ORAL_TABLET | Freq: Every day | ORAL | Status: DC

## 2013-10-21 MED ORDER — GLIPIZIDE 10 MG PO TABS: 10.0000 mg | ORAL_TABLET | Freq: Two times a day (BID) | ORAL | Status: DC

## 2013-10-21 MED ORDER — NYSTATIN 100000 UNIT/GM EX CREA: 1.0000 "application " | TOPICAL_CREAM | Freq: Four times a day (QID) | CUTANEOUS | Status: DC

## 2013-10-21 MED ORDER — AMLODIPINE BESYLATE 10 MG PO TABS: 10.0000 mg | ORAL_TABLET | Freq: Every day | ORAL | Status: DC

## 2013-10-21 MED ORDER — CARVEDILOL 25 MG PO TABS: 25.0000 mg | ORAL_TABLET | Freq: Two times a day (BID) | ORAL | Status: DC

## 2013-10-21 NOTE — Patient Instructions (Addendum)
-Increase your glipizide to 15 mg BID -Continue taking your medications as prescribed -Will refer you to eye doctor, podiatry, and for mammogram -Will take a sample of your urine today -Try these exercises for your feet -Will see you back in 3 months       -Nice seeing you again!                                                                                                                                                                                 Plantar Fasciitis (Heel Spur Syndrome) with Rehab The plantar fascia is a fibrous, ligament-like, soft-tissue structure that spans the bottom of the foot. Plantar fasciitis is a condition that causes pain in the foot due to inflammation of the tissue. SYMPTOMS   Pain and tenderness on the underneath side of the foot.  Pain that worsens with standing or walking. CAUSES  Plantar fasciitis is caused by irritation and injury to the plantar fascia on the underneath side of the foot. Common mechanisms of injury include:  Direct trauma to bottom of the foot.  Damage to a small nerve that runs under the foot where the main fascia attaches to the heel bone.  Stress placed on the plantar fascia due to bone spurs. RISK INCREASES WITH:   Activities that place stress on the plantar fascia (running, jumping, pivoting, or cutting).  Poor strength and flexibility.  Improperly fitted shoes.  Tight calf muscles.  Flat feet.  Failure to warm-up properly before activity.  Obesity. PREVENTION  Warm up and stretch properly before activity.  Allow for adequate recovery between workouts.  Maintain physical fitness:  Strength, flexibility, and endurance.  Cardiovascular fitness.  Maintain a health body weight.  Avoid stress on the plantar fascia.  Wear properly fitted shoes, including arch supports for individuals who have flat feet. PROGNOSIS  If treated properly, then the symptoms of plantar fasciitis usually resolve without surgery.  However, occasionally surgery is necessary. RELATED COMPLICATIONS   Recurrent symptoms that may result in a chronic condition.  Problems of the lower back that are caused by compensating for the injury, such as limping.  Pain or weakness of the foot during push-off following surgery.  Chronic inflammation, scarring, and partial or complete fascia tear, occurring more often from repeated injections. TREATMENT  Treatment initially involves the use of ice and medication to help reduce pain and inflammation. The use of strengthening and stretching exercises may help reduce pain with activity, especially stretches of the Achilles tendon. These exercises may be performed at home or with a therapist. Your caregiver may recommend that you use heel cups of arch supports to help reduce stress on the plantar fascia. Occasionally,  corticosteroid injections are given to reduce inflammation. If symptoms persist for greater than 6 months despite non-surgical (conservative), then surgery may be recommended.  MEDICATION   If pain medication is necessary, then nonsteroidal anti-inflammatory medications, such as aspirin and ibuprofen, or other minor pain relievers, such as acetaminophen, are often recommended.  Do not take pain medication within 7 days before surgery.  Prescription pain relievers may be given if deemed necessary by your caregiver. Use only as directed and only as much as you need.  Corticosteroid injections may be given by your caregiver. These injections should be reserved for the most serious cases, because they may only be given a certain number of times. HEAT AND COLD  Cold treatment (icing) relieves pain and reduces inflammation. Cold treatment should be applied for 10 to 15 minutes every 2 to 3 hours for inflammation and pain and immediately after any activity that aggravates your symptoms. Use ice packs or massage the area with a piece of ice (ice massage).  Heat treatment may be used  prior to performing the stretching and strengthening activities prescribed by your caregiver, physical therapist, or athletic trainer. Use a heat pack or soak the injury in warm water. SEEK IMMEDIATE MEDICAL CARE IF:  Treatment seems to offer no benefit, or the condition worsens.  Any medications produce adverse side effects. EXERCISES RANGE OF MOTION (ROM) AND STRETCHING EXERCISES - Plantar Fasciitis (Heel Spur Syndrome) These exercises may help you when beginning to rehabilitate your injury. Your symptoms may resolve with or without further involvement from your physician, physical therapist or athletic trainer. While completing these exercises, remember:   Restoring tissue flexibility helps normal motion to return to the joints. This allows healthier, less painful movement and activity.  An effective stretch should be held for at least 30 seconds.  A stretch should never be painful. You should only feel a gentle lengthening or release in the stretched tissue. RANGE OF MOTION - Toe Extension, Flexion  Sit with your right / left leg crossed over your opposite knee.  Grasp your toes and gently pull them back toward the top of your foot. You should feel a stretch on the bottom of your toes and/or foot.  Hold this stretch for __________ seconds.  Now, gently pull your toes toward the bottom of your foot. You should feel a stretch on the top of your toes and or foot.  Hold this stretch for __________ seconds. Repeat __________ times. Complete this stretch __________ times per day.  RANGE OF MOTION - Ankle Dorsiflexion, Active Assisted  Remove shoes and sit on a chair that is preferably not on a carpeted surface.  Place right / left foot under knee. Extend your opposite leg for support.  Keeping your heel down, slide your right / left foot back toward the chair until you feel a stretch at your ankle or calf. If you do not feel a stretch, slide your bottom forward to the edge of the  chair, while still keeping your heel down.  Hold this stretch for __________ seconds. Repeat __________ times. Complete this stretch __________ times per day.  STRETCH  Gastroc, Standing  Place hands on wall.  Extend right / left leg, keeping the front knee somewhat bent.  Slightly point your toes inward on your back foot.  Keeping your right / left heel on the floor and your knee straight, shift your weight toward the wall, not allowing your back to arch.  You should feel a gentle stretch in the right /  left calf. Hold this position for __________ seconds. Repeat __________ times. Complete this stretch __________ times per day. STRETCH  Soleus, Standing  Place hands on wall.  Extend right / left leg, keeping the other knee somewhat bent.  Slightly point your toes inward on your back foot.  Keep your right / left heel on the floor, bend your back knee, and slightly shift your weight over the back leg so that you feel a gentle stretch deep in your back calf.  Hold this position for __________ seconds. Repeat __________ times. Complete this stretch __________ times per day. STRETCH  Gastrocsoleus, Standing  Note: This exercise can place a lot of stress on your foot and ankle. Please complete this exercise only if specifically instructed by your caregiver.   Place the ball of your right / left foot on a step, keeping your other foot firmly on the same step.  Hold on to the wall or a rail for balance.  Slowly lift your other foot, allowing your body weight to press your heel down over the edge of the step.  You should feel a stretch in your right / left calf.  Hold this position for __________ seconds.  Repeat this exercise with a slight bend in your right / left knee. Repeat __________ times. Complete this stretch __________ times per day.  STRENGTHENING EXERCISES - Plantar Fasciitis (Heel Spur Syndrome)  These exercises may help you when beginning to rehabilitate your  injury. They may resolve your symptoms with or without further involvement from your physician, physical therapist or athletic trainer. While completing these exercises, remember:   Muscles can gain both the endurance and the strength needed for everyday activities through controlled exercises.  Complete these exercises as instructed by your physician, physical therapist or athletic trainer. Progress the resistance and repetitions only as guided. STRENGTH - Towel Curls  Sit in a chair positioned on a non-carpeted surface.  Place your foot on a towel, keeping your heel on the floor.  Pull the towel toward your heel by only curling your toes. Keep your heel on the floor.  If instructed by your physician, physical therapist or athletic trainer, add ____________________ at the end of the towel. Repeat __________ times. Complete this exercise __________ times per day. STRENGTH - Ankle Inversion  Secure one end of a rubber exercise band/tubing to a fixed object (table, pole). Loop the other end around your foot just before your toes.  Place your fists between your knees. This will focus your strengthening at your ankle.  Slowly, pull your big toe up and in, making sure the band/tubing is positioned to resist the entire motion.  Hold this position for __________ seconds.  Have your muscles resist the band/tubing as it slowly pulls your foot back to the starting position. Repeat __________ times. Complete this exercises __________ times per day.  Document Released: 05/19/2005 Document Revised: 08/11/2011 Document Reviewed: 08/31/2008 [facility] Patient Information 2014 [place], [place].

## 2013-10-21 NOTE — Progress Notes (Signed)
Case discussed with Dr. Naaman Plummer at the time of the visit.  We reviewed the resident's history and exam and pertinent patient test results.  I agree with the assessment, diagnosis, and plan of care documented in the resident's note.

## 2013-10-21 NOTE — Progress Notes (Signed)
Patient ID: Sonya Lane, female   DOB: July 27, 1950, 63 y.o.   MRN: [id number]   Subjective:   Patient ID: Sonya Lane female   DOB: 01-09-1951 63 y.o.   MRN: [id number]  HPI: Sonya Lane is a 63 y.o. 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    Pt reports bilateral foot pain at ball of feet and arches with swelling that started last week. Pain is worse when she takes first few steps in the morning. Reports no recent injuries, trauma, new shoes, or new exercises. She has been trying stretching exercises with some relief.   She reports that 4 days ago she noticed an area of redness in the top right corner of her right eye without pain, vision change from baseline, nausea, vomiting, tearing, discharge, headache, or photophobia. She has no associated allergic symptoms.   Last A1c was 8 on 06/10/13. Pt reports compliance with metformin 1000 mg BID and glipizide 10 mg BID with no side effects. Pt reports no recent hypoglycemic episodes. Pt with baseline polyuria, polydipsia, polyphagia, blurry vision, and peripheral neuropathy (in b/l feet). Her last eye exam 08/13/12 revealed mild bilateral non-proliferative diabetic retinopathy. She believes she has lost some weight and tries to eat healthy and exercise as much as she can.  She declines insulin therapy at this time.   She is compliant with taking lisinopril-HCTZ 20-25 mg, carvedilol 25 mg daily, and amlodipine 10 mg daily and does not check her blood pressure at home. She denies headache, chest pain, palpitation, cough, lower extremity swelling (only in feet), lightheadedness, or syncope.   She was previously on statin therapy for hypercholesteremia but due to intolerance (myalgias) is no longer taking any medication and declines trying an  alternative statin at this time.   She reports intertrigo that was present under both of her breasts has since resolved with application of nystatin cream. She tries to keep the area dry.   She reports being unable to afford nystatin oral suspension that was prescribed at last visit for possible oral thrush with persistent white residue on the lateral sides of her tongue. She reports she tried Listerine instead with some improvement. She declines testing for HIV testing because she has not been sexually active in years.      Past Medical History  Diagnosis Date  . DIABETES MELLITUS, TYPE II 03/17/2006        . HYPERLIPIDEMIA 03/17/2006    Qualifier: Diagnosis of  By: Sharlet Salina MD, [place]    . HYPERTENSION 03/17/2006        . Morbid obesity 01/25/2008    Qualifier: Diagnosis of  By: Phifer MD, Izora Gala     Current Outpatient Prescriptions  Medication Sig Dispense Refill  . amLODipine (NORVASC) 10 MG tablet Take 1 tablet (10 mg total) by mouth daily.  90 tablet  3  . aspirin 81 MG tablet Take 81 mg by mouth daily.        . carvedilol (COREG) 25 MG tablet Take 1 tablet (25 mg total) by mouth 2 (two) times daily with a meal.  180 tablet  3  . cyclobenzaprine (FLEXERIL) 5 MG tablet Take 5 mg by mouth at bedtime. For muscle pain/spasm       . gabapentin (NEURONTIN) 300 MG capsule Take 1 capsule (300 mg total) by mouth 3 (  three) times daily.  90 capsule  5  . glipiZIDE (GLUCOTROL) 10 MG tablet Take 1 tablet (10 mg total) by mouth 2 (two) times daily before a meal.  180 tablet  3  . hydrocortisone cream 1 % Apply to affected area 2-3 times daily  30 g  0  . lisinopril-hydrochlorothiazide (PRINZIDE,ZESTORETIC) 20-25 MG per tablet Take 1 tablet by mouth daily.  90 tablet  3  . metFORMIN (GLUCOPHAGE) 1000 MG tablet Take 1 tablet (1,000 mg total) by mouth 2 (two) times daily with a meal.  180 tablet  3  . Multiple Vitamin (MULTIVITAMIN) tablet Take 1 tablet by mouth daily.        . naproxen (NAPROSYN)  250 MG tablet Take 1 tablet (250 mg total) by mouth 2 (two) times daily with a meal.  60 tablet  1  . nystatin (MYCOSTATIN) 100000 UNIT/ML suspension Use as directed 4 mLs (400,000 Units total) in the mouth or throat 4 (four) times daily. Retain in mouth as long as possible, use for 48 hrs after symptoms resolve  60 mL  0  . nystatin cream (MYCOSTATIN) Apply 1 application topically 4 (four) times daily. Apply to affected area every 4-6 hours x 10 days  15 g  1  . pravastatin (PRAVACHOL) 20 MG tablet Take one tab two times per week (Monday and Friday)  30 tablet  6   No current facility-administered medications for this visit.   No family history on file. History   Social History  . Marital Status: Single    Spouse Name: N/A    Number of Children: N/A  . Years of Education: 55   Social History Main Topics  . Smoking status: Never Smoker   . Smokeless tobacco: None  . Alcohol Use: No  . Drug Use: No  . Sexual Activity: None   Other Topics Concern  . None   Social History Narrative   [name]  September 12, 2009 11:11 AM   Financial assistance approved for 40% discount at [facility] and has [facility] card   [employer]  Oct 02, 2009 2:20 PM   Review of Systems: Review of Systems  Constitutional: Positive for weight loss. Negative for fever, chills, malaise/fatigue and diaphoresis.  HENT: Negative for congestion and sore throat.   Eyes: Positive for blurred vision (at baseline in b/l eyes) and redness (right eye). Negative for double vision, photophobia, pain and discharge.  Respiratory: Negative for cough and shortness of breath.   Cardiovascular: Negative for chest pain, palpitations and leg swelling.  Gastrointestinal: Negative for heartburn, nausea, vomiting, abdominal pain, diarrhea, constipation, blood in stool and melena.  Genitourinary: Positive for frequency (at baseline). Negative for dysuria, urgency and hematuria.  Musculoskeletal: Negative for back pain, joint pain and  myalgias.       B/l foot pain with edema   Skin: Negative for rash.  Neurological: Positive for sensory change (at baseline in b/l feet). Negative for dizziness, weakness and headaches.    Objective:  Physical Exam: Filed Vitals:   10/21/13 1432 10/21/13 1556  BP: 151/95 130/82  Pulse: 81 83  Temp: 97.2 F (36.2 C)   TempSrc: Oral   Weight: 131.997 kg (291 lb)   SpO2: 99%     Physical Exam  Constitutional: She is oriented to person, place, and time. She appears well-developed and well-nourished. No distress.  HENT:  Head: Normocephalic and atraumatic.  Right Ear: External ear normal.  Left Ear: External ear normal.  Nose: Nose  normal.  Mouth/Throat: Oropharynx is clear and moist. No oropharyngeal exudate.  White residue on lateral sides of tonuge  Eyes: Conjunctivae and EOM are normal. Pupils are equal, round, and reactive to light. Right eye exhibits no discharge. Left eye exhibits no discharge. No scleral icterus.  Neck: Normal range of motion. Neck supple.  Cardiovascular: Normal rate, regular rhythm and normal heart sounds.   Pulmonary/Chest: Effort normal and breath sounds normal. No respiratory distress. She has no wheezes. She has no rales.  Poor air flow with decreased breath sounds b/l  Abdominal: Soft. Bowel sounds are normal. She exhibits no distension. There is no tenderness. There is no rebound and no guarding.  obese  Genitourinary:  No pannus under breasts  Musculoskeletal: Normal range of motion. She exhibits edema (b/l feet and toes, trace b/l LE pitting edema). She exhibits no tenderness.  Neurological: She is alert and oriented to person, place, and time. No cranial nerve deficit.  Skin: Skin is warm and dry. No rash noted. She is not diaphoretic. No erythema. No pallor.  Psychiatric: She has a normal mood and affect. Her behavior is normal. Judgment and thought content normal.    Assessment & Plan:  Please see problem-based assessment and plan

## 2013-10-22 LAB — MICROALBUMIN / CREATININE URINE RATIO
CREATININE, URINE: 105.1 mg/dL
MICROALB UR: 8.66 mg/dL — AB (ref 0.00–1.89)
MICROALB/CREAT RATIO: 82.4 mg/g — AB (ref 0.0–30.0)

## 2013-10-23 DIAGNOSIS — H1131 Conjunctival hemorrhage, right eye: Secondary | ICD-10-CM | POA: Insufficient documentation

## 2013-10-23 MED ORDER — GLIPIZIDE 10 MG PO TABS: 15.0000 mg | ORAL_TABLET | Freq: Two times a day (BID) | ORAL | Status: DC

## 2013-10-23 NOTE — Assessment & Plan Note (Addendum)
-  Pt declined zoster vaccination at this time -Referral for yearly mammography (last one 07/13/12) -Referral for diabetic eye exam (last one 08/30/12) -Next colonoscopy 2016 (last one 06/25/09 with adenomatous polyp that was removed)

## 2013-10-23 NOTE — Assessment & Plan Note (Addendum)
Assessment: Pt with non-insulin Type II DM complicated by retinopathy and neuropathy with last HbA1c of 8.0 on 06/10/13 who is compliant with two-class oral therapy with no recent hypoglycemic events who presents with CBG of 134 and HbA1c of 7.9 indicating average 20-monthblood glucose of 180.   Plan:  -HbA1c 7.9 not at goal <7.0, continue metformin 1000 mg BID increase glipizide 10 mg BID to 15 mg BID, pt not agreeable to insulin therapy, will consider additional oral medication at next visit if no improvement  -BP repeat today 130/82 at goal <140/90, continue anti-hypertensive therapy -Obtain microalbumin: Cr ratio ---> 82.4 mg/g, improved from 153.3 mg/g three yrs ago, continue ACEi  -LDL 132 not at goal <100, pt declines statin therapy  -Perform foot exam today ---> left great toe ingrown toenail, will refer to podiatry -Referral to opthalmology for annual dilated eye exam (last one 08/30/12) -Continue gabapentin 300 mg TID for peripheral neuropathy  -BMI 48.43 not at goal <25, encourage weight loss  -Continue 81 mg aspirin daily for primary CV prevention

## 2013-10-23 NOTE — Assessment & Plan Note (Signed)
Assessment: Pt with well-controlled hypertension who is compliant with 4-class antihypertensive therapy who presents with blood pressure of 130/82.   Plan:  -BP repeat today 130/82 at goal <140/90  -Continue amlodipine 10 mg daily  -Continue lisinopril-HCTZ 20/25 mg daily, monitor for cough  -Continue carvedilol 25 mg daily

## 2013-10-23 NOTE — Assessment & Plan Note (Signed)
Assessment: Pt with history of mild non-proliferative diabetic retinopathy with recent subconjunctival hemorrhage of right red eye with no alarm symptoms who has improved since acute onset 4 days ago.   Plan: -Pt reassured that symptoms will resolve within 1-2 weeks  -To return if develops alarm symptoms  -Pt to be referred to opthalmology for diabetic retinopathy

## 2013-10-23 NOTE — Assessment & Plan Note (Addendum)
Assessment: Pt with morbid obesity (BMI 48.43) and pes planus with no recent trauma or injury who presents with bilateral plantar pain worse in the morning concerning for plantar fasciitis.    Plan:  -Pt given information regarding stretching exercises  -Pt instructed to continue wearing arch supports  -Pt encouraged to lose weight -Continue to monitor, if no improvement consider xray of b/l feet to assess for bone spurs

## 2013-10-23 NOTE — Assessment & Plan Note (Addendum)
Assessment: Pt with chronic candidal infection underneath both breasts in setting of diabetes and obesity improved with anti-fungal therapy.   Plan:  -Pt instructed to apply nystatin cream x4 daily to area if reoccurs

## 2013-10-23 NOTE — Assessment & Plan Note (Addendum)
Assessment: Pt with chronic painless non-scrapable white coating on lateral sides of tongue most likely manifestation of  NAFLD with no inhaled corticosteroid use or immunocompromise.   Plan:  -Pt declined HIV testing at this time (prior testing on 01/27/12 was negative)  -Continue to monitor

## 2013-10-25 NOTE — Addendum Note (Signed)
Addended by: Marcelino Duster on: 10/25/2013 10:13 AM   Modules accepted: Orders

## 2013-10-31 ENCOUNTER — Other Ambulatory Visit: Payer: Self-pay | Admitting: *Deleted

## 2013-10-31 DIAGNOSIS — E119 Type 2 diabetes mellitus without complications: Secondary | ICD-10-CM

## 2013-10-31 MED ORDER — GLIPIZIDE 10 MG PO TABS: 15.0000 mg | ORAL_TABLET | Freq: Two times a day (BID) | ORAL | Status: DC

## 2013-10-31 NOTE — Telephone Encounter (Signed)
Call from pt - need new rx fax to [facility].  Thanks

## 2013-11-15 ENCOUNTER — Ambulatory Visit (HOSPITAL_COMMUNITY)
Admission: RE | Admit: 2013-11-15 | Discharge: 2013-11-15 | Disposition: A | Discharge status: Home / Self Care | Payer: Medicare Other | Source: Ambulatory Visit | Attending: Internal Medicine | Admitting: Internal Medicine

## 2013-11-15 DIAGNOSIS — Z0000 Encounter for general adult medical examination without abnormal findings: Secondary | ICD-10-CM

## 2013-11-15 DIAGNOSIS — Z1231 Encounter for screening mammogram for malignant neoplasm of breast: Secondary | ICD-10-CM | POA: Insufficient documentation

## 2013-11-30 ENCOUNTER — Encounter: Payer: Self-pay | Admitting: Podiatry

## 2013-11-30 ENCOUNTER — Ambulatory Visit (INDEPENDENT_AMBULATORY_CARE_PROVIDER_SITE_OTHER): Payer: Medicare Other

## 2013-11-30 ENCOUNTER — Ambulatory Visit (INDEPENDENT_AMBULATORY_CARE_PROVIDER_SITE_OTHER): Payer: Medicare Other | Admitting: Podiatry

## 2013-11-30 DIAGNOSIS — M2142 Flat foot [pes planus] (acquired), left foot: Principal | ICD-10-CM

## 2013-11-30 DIAGNOSIS — M2140 Flat foot [pes planus] (acquired), unspecified foot: Secondary | ICD-10-CM

## 2013-11-30 DIAGNOSIS — M2141 Flat foot [pes planus] (acquired), right foot: Secondary | ICD-10-CM

## 2013-11-30 DIAGNOSIS — M779 Enthesopathy, unspecified: Secondary | ICD-10-CM

## 2013-11-30 DIAGNOSIS — M7750 Other enthesopathy of unspecified foot: Secondary | ICD-10-CM

## 2013-11-30 DIAGNOSIS — L600 Ingrowing nail: Secondary | ICD-10-CM

## 2013-11-30 NOTE — Patient Instructions (Addendum)
Betadine Soak Instructions  Purchase an 8 oz. bottle of BETADINE solution (Povidone)  THE DAY AFTER THE PROCEDURE  Place 1 tablespoon of betadine solution in a quart of warm tap water.  Submerge your foot or feet with outer bandage intact for the initial soak; this will allow the bandage to become moist and wet for easy lift off.  Once you remove your bandage, continue to soak in the solution for 20 minutes.  This soak should be done twice a day.  Next, remove your foot or feet from solution, blot dry the affected area and cover.  You may use a band aid large enough to cover the area or use gauze and tape.  Apply other medications to the area as directed by the doctor such as cortisporin otic solution (ear drops) or neosporin.  IF YOUR SKIN BECOMES IRRITATED WHILE USING THESE INSTRUCTIONS, IT IS OKAY TO SWITCH TO EPSOM SALTS AND WATER OR WHITE VINEGAR AND WATER.    [facility] Instructions-Post Nail Surgery  You have had your ingrown toenail and root treated with a chemical.  This chemical causes a burn that will drain and ooze like a blister.  This can drain for 6-8 weeks or longer.  It is important to keep this area clean, covered, and follow the soaking instructions dispensed at the time of your surgery.  This area will eventually dry and form a scab.  Once the scab forms you no longer need to soak or apply a dressing.  If at any time you experience an increase in pain, redness, swelling, or drainage, you should contact the office as soon as possible.    Diabetes and Foot Care Diabetes may cause you to have problems because of poor blood supply (circulation) to your feet and legs. This may cause the skin on your feet to become thinner, break easier, and heal more slowly. Your skin may become dry, and the skin may peel and crack. You may also have nerve damage in your legs and feet causing decreased feeling in them. You may not notice minor injuries to your feet that could lead to infections  or more serious problems. Taking care of your feet is one of the most important things you can do for yourself.  HOME CARE INSTRUCTIONS  Wear shoes at all times, even in the house. Do not go barefoot. Bare feet are easily injured.  Check your feet daily for blisters, cuts, and redness. If you cannot see the bottom of your feet, use a mirror or ask someone for help.  Wash your feet with warm water (do not use hot water) and mild soap. Then pat your feet and the areas between your toes until they are completely dry. Do not soak your feet as this can dry your skin.  Apply a moisturizing lotion or petroleum jelly (that does not contain alcohol and is unscented) to the skin on your feet and to dry, brittle toenails. Do not apply lotion between your toes.  Trim your toenails straight across. Do not dig under them or around the cuticle. File the edges of your nails with an emery board or nail file.  Do not cut corns or calluses or try to remove them with medicine.  Wear clean socks or stockings every day. Make sure they are not too tight. Do not wear knee-high stockings since they may decrease blood flow to your legs.  Wear shoes that fit properly and have enough cushioning. To break in new shoes, wear them  for just a few hours a day. This prevents you from injuring your feet. Always look in your shoes before you put them on to be sure there are no objects inside.  Do not cross your legs. This may decrease the blood flow to your feet.  If you find a minor scrape, cut, or break in the skin on your feet, keep it and the skin around it clean and dry. These areas may be cleansed with mild soap and water. Do not cleanse the area with peroxide, alcohol, or iodine.  When you remove an adhesive bandage, be sure not to damage the skin around it.  If you have a wound, look at it several times a day to make sure it is healing.  Do not use heating pads or hot water bottles. They may burn your skin. If you  have lost feeling in your feet or legs, you may not know it is happening until it is too late.  Make sure your health care provider performs a complete foot exam at least annually or more often if you have foot problems. Report any cuts, sores, or bruises to your health care provider immediately. SEEK MEDICAL CARE IF:   You have an injury that is not healing.  You have cuts or breaks in the skin.  You have an ingrown nail.  You notice redness on your legs or feet.  You feel burning or tingling in your legs or feet.  You have pain or cramps in your legs and feet.  Your legs or feet are numb.  Your feet always feel cold. SEEK IMMEDIATE MEDICAL CARE IF:   There is increasing redness, swelling, or pain in or around a wound.  There is a red line that goes up your leg.  Pus is coming from a wound.  You develop a fever or as directed by your health care provider.  You notice a bad smell coming from an ulcer or wound. Document Released: 05/16/2000 Document Revised: 01/19/2013 Document Reviewed: 10/26/2012 [facility] Patient Information 2015 [place], [place]. This information is not intended to replace advice given to you by your health care provider. Make sure you discuss any questions you have with your health care provider.

## 2013-11-30 NOTE — Progress Notes (Signed)
   Subjective:    Patient ID: Sonya Lane, female    DOB: November 15, 1950, 63 y.o.   MRN: [id number]  HPI Comments: "My toe is ingrown and all the toes just ache"  Patient c/o tenderness along both nail borders of the 1st toe left for few weeks. She tries to keep trimmed.   Also, says that all her toes ache. She is not sure if its due to diabetes or something else.  Toe Pain  Associated symptoms include numbness.      Review of Systems  Constitutional: Positive for fever, appetite change and unexpected weight change.  Cardiovascular:       Calf pain with walking   Gastrointestinal: Positive for abdominal pain, constipation and abdominal distention.  Endocrine: Positive for polyphagia.  Genitourinary: Positive for frequency.  Musculoskeletal: Positive for arthralgias, gait problem and myalgias.  Skin:       Change in nails  Neurological: Positive for dizziness, weakness, light-headedness, numbness and headaches.  Psychiatric/Behavioral: The patient is nervous/anxious.   All other systems reviewed and are negative.      Objective:   Physical Exam        Assessment & Plan:

## 2013-12-01 ENCOUNTER — Other Ambulatory Visit: Payer: Self-pay | Admitting: *Deleted

## 2013-12-01 DIAGNOSIS — I10 Essential (primary) hypertension: Secondary | ICD-10-CM

## 2013-12-01 DIAGNOSIS — E119 Type 2 diabetes mellitus without complications: Secondary | ICD-10-CM

## 2013-12-01 NOTE — Progress Notes (Signed)
Subjective:     Patient ID: Sonya Lane, female   DOB: Jun 11, 1950, 63 y.o.   MRN: [id number]  Toe Pain    patient presents stating I have a painful ingrown on my right big toe and also my toes in general can become tender and I have had diabetes that is under good control   Review of Systems  All other systems reviewed and are negative.      Objective:   Physical Exam  Nursing note and vitals reviewed. Constitutional: She is oriented to person, place, and time.  Cardiovascular: Intact distal pulses.   Musculoskeletal: Normal range of motion.  Neurological: She is oriented to person, place, and time.  Skin: Skin is dry.   neurovascular status is intact with digits well perfused and arch height diminished. Muscular skeletal lower extremity muscle strength found to be adequate and range of motion of the subtalar joint midtarsal joint within normal limits. Patient's right hallux medial border is the most incurvated with mild also on the lateral border and discomfort in general and the toes of a nonspecific nature     Assessment:     Ingrown toenail deformity right hallux with mild to moderate structural deformity of both feet and long-term diabetes    Plan:     H&P and x-ray reviewed with patient. I recommended removal of the medial corner right explaining that the entire nail may need to be removed at one point. Explain the risk of procedure patient understands and today I infiltrated 60 mg I can Marcaine mixture remove the medial corner exposed matrix and apply chemical phenol 3 applications followed by alcohol and sterile dressing. Gave instructions on soaks

## 2013-12-05 MED ORDER — GLIPIZIDE 10 MG PO TABS: 15.0000 mg | ORAL_TABLET | Freq: Two times a day (BID) | ORAL | Status: DC

## 2013-12-05 MED ORDER — AMLODIPINE BESYLATE 10 MG PO TABS: 10.0000 mg | ORAL_TABLET | Freq: Every day | ORAL | Status: DC

## 2013-12-12 ENCOUNTER — Other Ambulatory Visit: Payer: Self-pay | Admitting: *Deleted

## 2013-12-12 DIAGNOSIS — G629 Polyneuropathy, unspecified: Secondary | ICD-10-CM

## 2013-12-12 MED ORDER — GABAPENTIN 300 MG PO CAPS: 300.0000 mg | ORAL_CAPSULE | Freq: Three times a day (TID) | ORAL | Status: DC

## 2013-12-12 NOTE — Telephone Encounter (Signed)
Requesting 90 day supply. No longer receiving from [facility].  Thanks

## 2014-01-11 ENCOUNTER — Other Ambulatory Visit: Payer: Self-pay | Admitting: *Deleted

## 2014-01-11 DIAGNOSIS — E785 Hyperlipidemia, unspecified: Secondary | ICD-10-CM

## 2014-01-11 NOTE — Telephone Encounter (Signed)
Last visit note states pt declined statin therapy

## 2014-01-12 MED ORDER — PRAVASTATIN SODIUM 10 MG PO TABS: ORAL_TABLET | ORAL | Status: DC

## 2014-02-08 ENCOUNTER — Telehealth: Payer: Self-pay | Admitting: *Deleted

## 2014-02-08 NOTE — Telephone Encounter (Signed)
I was in there to be fitted for a pair of shoes and they said they couldn't get in contact with my doctor.  My doctor gave me the fax number.  They said you need to fax you information to them.  The fax number is [phone]  Thank You!

## 2014-02-09 ENCOUNTER — Encounter: Payer: Self-pay | Admitting: Internal Medicine

## 2014-02-09 ENCOUNTER — Ambulatory Visit (INDEPENDENT_AMBULATORY_CARE_PROVIDER_SITE_OTHER): Payer: Medicare Other | Admitting: Internal Medicine

## 2014-02-09 VITALS — BP 157/88 | HR 82 | Temp 98.1°F | Ht 65.5 in | Wt 292.6 lb

## 2014-02-09 DIAGNOSIS — I10 Essential (primary) hypertension: Secondary | ICD-10-CM

## 2014-02-09 DIAGNOSIS — Z23 Encounter for immunization: Secondary | ICD-10-CM

## 2014-02-09 DIAGNOSIS — E113299 Type 2 diabetes mellitus with mild nonproliferative diabetic retinopathy without macular edema, unspecified eye: Secondary | ICD-10-CM

## 2014-02-09 DIAGNOSIS — E11329 Type 2 diabetes mellitus with mild nonproliferative diabetic retinopathy without macular edema: Secondary | ICD-10-CM

## 2014-02-09 DIAGNOSIS — E1139 Type 2 diabetes mellitus with other diabetic ophthalmic complication: Secondary | ICD-10-CM

## 2014-02-09 DIAGNOSIS — Z0000 Encounter for general adult medical examination without abnormal findings: Secondary | ICD-10-CM

## 2014-02-09 DIAGNOSIS — E11311 Type 2 diabetes mellitus with unspecified diabetic retinopathy with macular edema: Secondary | ICD-10-CM

## 2014-02-09 LAB — COMPLETE METABOLIC PANEL WITH GFR
ALK PHOS: 63 U/L (ref 39–117)
ALT: 70 U/L — ABNORMAL HIGH (ref 0–35)
AST: 48 U/L — ABNORMAL HIGH (ref 0–37)
Albumin: 4.2 g/dL (ref 3.5–5.2)
BILIRUBIN TOTAL: 0.6 mg/dL (ref 0.2–1.2)
BUN: 8 mg/dL (ref 6–23)
CO2: 29 mEq/L (ref 19–32)
Calcium: 10.1 mg/dL (ref 8.4–10.5)
Chloride: 97 mEq/L (ref 96–112)
Creat: 0.74 mg/dL (ref 0.50–1.10)
GFR, EST NON AFRICAN AMERICAN: 85 mL/min
GFR, Est African American: >89 mL/min
Glucose, Bld: 273 mg/dL — ABNORMAL HIGH (ref 70–99)
Potassium: 4.2 mEq/L (ref 3.5–5.3)
Sodium: 138 mEq/L (ref 135–145)
Total Protein: 7.3 g/dL (ref 6.0–8.3)

## 2014-02-09 LAB — GLUCOSE, CAPILLARY: Glucose-Capillary: 260 mg/dL — ABNORMAL HIGH (ref 70–99)

## 2014-02-09 LAB — POCT GLYCOSYLATED HEMOGLOBIN (HGB A1C): Hemoglobin A1C: 8.8

## 2014-02-09 MED ORDER — GLIPIZIDE 10 MG PO TABS: 10.0000 mg | ORAL_TABLET | Freq: Two times a day (BID) | ORAL | Status: DC

## 2014-02-09 NOTE — Progress Notes (Signed)
Patient ID: Sonya Lane, female   DOB: Oct 27, 1950, 63 y.o.   MRN: [id number]    Subjective:   Patient ID: Sonya Lane female   DOB: September 23, 1950 63 y.o.   MRN: [id number]  HPI: [name] is a 63 y.o. woman with past medical history of hypertension, hyperlipidemia, non-insulin Type 2 DM, and NAFLD who presents for routine clinic visit.   Her last A1c was 7.9 on 10/23/13. She reports compliance with taking metformin 1000 mg BID and glipizide 10 mg BID. She was not able to tolerate taking 15 mg BID that was prescribed at last visit due to itching. She does not check her blood glucose at home because she does not have a meter. She reports occasional symptomatic hypoglycemia and has chronic blurry vision, polyuria, polydipsia, polyphagia, and peripheral neuropathy. She was recently seen at podiatry who requested diabetic shoes. She reports recently having an annual eye exam in July of this year. She tries to follow a healthy diet but does not exercise much. Her weight has been stable.   She reports compliance with taking amlodipine, lisinopril-HCTZ, and carvedilol for hypertension. She has morning headaches but denies chest pain, LE edema, or lightheadedness. She has cough in the morning without dyspnea, wheezing, or chest tightness. She denies history of tobacco use.   She reports compliance with taking pravastatin for hyperlipidemia. She has occasional muscle cramping and takes it twice weekly.        Past Medical History  Diagnosis Date  . DIABETES MELLITUS, TYPE II 03/17/2006        . HYPERLIPIDEMIA 03/17/2006    Qualifier: Diagnosis of  By: Sharlet Salina MD, [place]    . HYPERTENSION 03/17/2006        . Morbid obesity 01/25/2008    Qualifier: Diagnosis of  By: Phifer MD, Izora Gala     Current Outpatient Prescriptions  Medication Sig Dispense Refill  . amLODipine (NORVASC) 10 MG tablet Take 1 tablet (10 mg total) by mouth daily.  90 tablet  3  . aspirin 81 MG tablet Take 81 mg  by mouth daily.        . carvedilol (COREG) 25 MG tablet Take 1 tablet (25 mg total) by mouth 2 (two) times daily with a meal.  180 tablet  3  . cyclobenzaprine (FLEXERIL) 5 MG tablet Take 5 mg by mouth at bedtime. For muscle pain/spasm       . gabapentin (NEURONTIN) 300 MG capsule Take 1 capsule (300 mg total) by mouth 3 (three) times daily.  270 capsule  3  . glipiZIDE (GLUCOTROL) 10 MG tablet Take 1.5 tablets (15 mg total) by mouth 2 (two) times daily before a meal.  180 tablet  3  . lisinopril-hydrochlorothiazide (PRINZIDE,ZESTORETIC) 20-25 MG per tablet Take 1 tablet by mouth daily.  90 tablet  3  . metFORMIN (GLUCOPHAGE) 1000 MG tablet Take 1 tablet (1,000 mg total) by mouth 2 (two) times daily with a meal.  180 tablet  3  . Multiple Vitamin (MULTIVITAMIN) tablet Take 1 tablet by mouth daily.        . naproxen (NAPROSYN) 250 MG tablet Take 1 tablet (250 mg total) by mouth 2 (two) times daily with a meal.  60 tablet  1  . nystatin (MYCOSTATIN) 100000 UNIT/ML suspension Use as directed 4 mLs (400,000 Units total) in the mouth or throat 4 (four) times daily. Retain in mouth as long as possible, use for 48 hrs after symptoms resolve  60 mL  0  .  nystatin cream (MYCOSTATIN) Apply 1 application topically 4 (four) times daily. Apply to affected area every 4-6 hours x 10 days  15 g  1  . pravastatin (PRAVACHOL) 10 MG tablet Take one tab two times per week (Monday and Friday)  90 tablet  3  . pravastatin (PRAVACHOL) 20 MG tablet Take one tab two times per week (Monday and Friday)  30 tablet  6   No current facility-administered medications for this visit.   No family history on file. History   Social History  . Marital Status: Single    Spouse Name: N/A    Number of Children: N/A  . Years of Education: 48   Social History Main Topics  . Smoking status: Never Smoker   . Smokeless tobacco: Not on file  . Alcohol Use: No  . Drug Use: No  . Sexual Activity: Not on file   Other Topics Concern   . Not on file   Social History Narrative   [name]  September 12, 2009 11:11 AM   Financial assistance approved for 40% discount at [facility] and has [facility] card   [employer]  Oct 02, 2009 2:20 PM   Review of Systems: Review of Systems  Constitutional: Negative for fever and chills.  HENT: Negative for tinnitus.   Eyes: Positive for blurred vision (chronic).  Respiratory: Positive for cough (dry in mornings). Negative for shortness of breath and wheezing.   Cardiovascular: Negative for chest pain and leg swelling.  Gastrointestinal: Positive for constipation (chronic). Negative for nausea, vomiting, abdominal pain and diarrhea.  Genitourinary: Negative for dysuria and urgency.       Chronic polyuria  Musculoskeletal: Positive for myalgias.  Neurological: Positive for sensory change (chronic peripheral neuropathy) and headaches (morning). Negative for dizziness.     Objective:  Physical Exam: Physical Exam  Constitutional: She is oriented to person, place, and time. She appears well-developed and well-nourished. No distress.  HENT:  Head: Normocephalic and atraumatic.  Right Ear: External ear normal.  Left Ear: External ear normal.  Nose: Nose normal.  Mouth/Throat: Oropharynx is clear and moist. No oropharyngeal exudate.  Eyes: Conjunctivae and EOM are normal. Pupils are equal, round, and reactive to light. Right eye exhibits no discharge. Left eye exhibits no discharge. No scleral icterus.  Neck: Normal range of motion. Neck supple.  Cardiovascular: Normal rate and regular rhythm.   Pulmonary/Chest: Breath sounds normal. No respiratory distress. She has no wheezes. She has no rales.  Abdominal: Soft. Bowel sounds are normal. She exhibits no distension. There is no tenderness. There is no rebound and no guarding.  Musculoskeletal: Normal range of motion. She exhibits no edema and no tenderness.  Neurological: She is alert and oriented to person, place, and time.  Skin: Skin is  warm and dry. No rash noted. She is not diaphoretic. No erythema. No pallor.  Psychiatric: She has a normal mood and affect. Her behavior is normal. Judgment and thought content normal.    Filed Vitals:   02/09/14 0844  BP: 159/86  Pulse: 93  Temp: 98.1 F (36.7 C)  TempSrc: Oral  Height: 5' 5.5" (1.664 m)  Weight: 292 lb 9.6 oz (132.722 kg)  SpO2: 97%    Assessment & Plan:   Please see problem list for problem-based assessment and plan

## 2014-02-09 NOTE — Patient Instructions (Signed)
-  When you decide if you want to go on injectables please call me -If your cough continues we can change you to another blood pressure medication -Will check your labs and give you a flu shot today -Will see you back in 3 months    General Instructions:   Please bring your medicines with you each time you come to clinic.  Medicines may include prescription medications, over-the-counter medications, herbal remedies, eye drops, vitamins, or other pills.   Progress Toward Treatment Goals:  Treatment Goal 03/07/2013  Hemoglobin A1C at goal  Blood pressure at goal    Self Care Goals & Plans:  Self Care Goal 02/09/2014  Manage my medications take my medicines as prescribed; bring my medications to every visit; refill my medications on time; follow the sick day instructions if I am sick  Monitor my health keep track of my blood pressure; check my feet daily  Eat healthy foods eat more vegetables; eat fruit for snacks and desserts; eat baked foods instead of fried foods; eat smaller portions; drink diet soda or water instead of juice or soda  Be physically active find an activity I enjoy  Meeting treatment goals -    Home Blood Glucose Monitoring 02/04/2013  Check my blood sugar no home glucose monitoring     Care Management & Community Referrals:  Referral 03/07/2013  Referrals made for care management support none needed  Referrals made to community resources weight management

## 2014-02-09 NOTE — Assessment & Plan Note (Addendum)
Assessment: Pt with last A1c of 7.9 on 10/21/13 compliant with dual oral hypoglycemic therapy with symptoms of hyperglycemia and occasional hypoglycemia who presents with CBG of 260 and worsened A1c of  8.8.  Plan:  -A1c 8.8 not at goal <7. We discussed starting GLP-1 agonist therapy which pt stated she will call me as soon as she decides. Continue metformin 1000 mg BID and decrease glipizide from 15 mg BID to 10 mg BID (was not able to tolerate 15 mg BID and has been taking 10 mg BID instead). Pt also encouraged to meet with Debera Lat for lifestyle education.  -BP 157/88 not at goal <140/90, continue amlodipine 10 mg daily, lisinopril-HCTZ 20-25 mg daily, and carvedilol 25 mg BID -LDL 132 not at goal <100, continue pravastatin 10 mg twice weekly (due to intolerance) -Obtain records of recent annual eye exam -Last annual foot exam and urine microalbumin on 10/21/13 -Prescription written for diabetic shoes and foot inserts -BMI 47.93 not at goal <30, encourage weight loss -Continue aspirin 81 mg daily for primary CVD prevention

## 2014-02-09 NOTE — Assessment & Plan Note (Signed)
Pt received annual influenza vaccination today.

## 2014-02-09 NOTE — Assessment & Plan Note (Signed)
Assessment: Pt with moderately well-controlled hypertension with reported compliance with four-class (CCB, ACEi, BB & diuretic) anti-hypertensive therapy who presents with initial blood pressure of 159/86 later slightly improved to 157/88.   Plan:  -Initial BP 159/86 slightly improved to 157/88 not at goal <140/90 -Continue amlodipine 10 mg daily, lisinopril-HCTZ 20-25 mg daily, and carvedilol 25 mg BID (possible noncompliance due to cost?) -If cough persists consider changing lisinopril to losartan -Last CMP 06/10/13, repeat CMP

## 2014-02-10 ENCOUNTER — Telehealth: Payer: Self-pay | Admitting: Dietician

## 2014-02-10 NOTE — Telephone Encounter (Signed)
Mailing voucher for free One Touch meter. Asked patient if she is agreeable to getting new meter next week to call triage nurse and request prescription for meter, strips and lancets for meter of her choice.

## 2014-02-14 ENCOUNTER — Other Ambulatory Visit: Payer: Self-pay | Admitting: *Deleted

## 2014-02-14 DIAGNOSIS — I10 Essential (primary) hypertension: Secondary | ICD-10-CM

## 2014-02-15 MED ORDER — CARVEDILOL 25 MG PO TABS: 25.0000 mg | ORAL_TABLET | Freq: Two times a day (BID) | ORAL | Status: DC

## 2014-02-17 NOTE — Telephone Encounter (Signed)
Have not heard from pt.  Can this be done at your next appointment 9/21?

## 2014-02-20 ENCOUNTER — Ambulatory Visit (INDEPENDENT_AMBULATORY_CARE_PROVIDER_SITE_OTHER): Payer: Medicare Other | Admitting: Dietician

## 2014-02-20 ENCOUNTER — Other Ambulatory Visit: Payer: Self-pay | Admitting: Dietician

## 2014-02-20 ENCOUNTER — Other Ambulatory Visit: Payer: Self-pay | Admitting: Internal Medicine

## 2014-02-20 DIAGNOSIS — E11329 Type 2 diabetes mellitus with mild nonproliferative diabetic retinopathy without macular edema: Secondary | ICD-10-CM

## 2014-02-20 DIAGNOSIS — E113299 Type 2 diabetes mellitus with mild nonproliferative diabetic retinopathy without macular edema, unspecified eye: Secondary | ICD-10-CM

## 2014-02-20 DIAGNOSIS — E1139 Type 2 diabetes mellitus with other diabetic ophthalmic complication: Secondary | ICD-10-CM

## 2014-02-20 DIAGNOSIS — I10 Essential (primary) hypertension: Secondary | ICD-10-CM

## 2014-02-20 MED ORDER — AMLODIPINE BESYLATE 10 MG PO TABS: 10.0000 mg | ORAL_TABLET | Freq: Every day | ORAL | Status: DC

## 2014-02-20 MED ORDER — ONETOUCH VERIO IQ SYSTEM W/DEVICE KIT: PACK | Status: DC

## 2014-02-20 MED ORDER — ONETOUCH DELICA LANCETS FINE MISC: Status: DC

## 2014-02-20 MED ORDER — GLUCOSE BLOOD VI STRP: ORAL_STRIP | Status: DC

## 2014-02-20 NOTE — Telephone Encounter (Signed)
Patient requests rxs for :90 day rx for norvasc sent to [place] and diabetes testing supplies for new meter.

## 2014-02-20 NOTE — Progress Notes (Signed)
  Medical Nutrition Therapy:  Appt start time: 1030 end time:  1200.   Assessment:  Primary concerns today: "getting A1C diown"  Patient visit today was for a meter and to learn how to check  Her blood sugars. She had many questions about medications, diet, self monitoring numbers as well as Medicare coverage for diabetes training and supplies.  Offered her DSME classes and support group, but she was not interested in those options. CBG was 261 after coffee with milk ~ 11 AM. One touch Verio meter provided with basic instruction.  Preferred Learning Style: Did better with Hands on, but no preference indicated  Learning Readiness: Ready, Change in progress MEDICATIONS: discussed insulin and incretins DIETARY INTAKE: Usual eating pattern not discussed today.   24-hr recall not done today. Was not appropriate.   Usual physical activity: limited to walking at [place] which tires her out.    Progress Towards Goal(s):  In progress.   Nutritional Diagnosis:  NB-1.1 Food and nutrition-related knowledge deficit As related to lack of prior diabetes training.  As evidenced by her questions and interest..    Intervention:  Nutrition education about meters, meal planning, self monitoring and Medicare coverage for diabetes training an supplies.  Coordination of care- request Rx for testing supplies Teaching Method Utilized: Visual, Auditory, Hands on Handouts given during visit include: AVS Barriers to learning/adherence to lifestyle change: lack of knowledge, fear, reluctance to start insulin/injectable medicine  Demonstrated degree of understanding via:  Teach Back   Monitoring/Evaluation:  Dietary intake, exercise, blood sugars, and body weight in 2 week(s).

## 2014-02-20 NOTE — Patient Instructions (Addendum)
Please check your blood sugar before meals and bedtime for three days   Write down on the paper provided with other info- meal size, energy and activity   Please make an appointment 1-2 weeks  Please call with questions. [phone]  Butch Penny

## 2014-02-24 ENCOUNTER — Ambulatory Visit (INDEPENDENT_AMBULATORY_CARE_PROVIDER_SITE_OTHER): Payer: Medicare Other | Admitting: *Deleted

## 2014-02-24 DIAGNOSIS — M7750 Other enthesopathy of unspecified foot: Secondary | ICD-10-CM

## 2014-02-24 NOTE — Progress Notes (Signed)
   Subjective:    Patient ID: Sonya Lane, female    DOB: 1951-02-26, 63 y.o.   MRN: [id number]  HPI DIABETIC SHOE MEASUREMENT.   Review of Systems     Objective:   Physical Exam        Assessment & Plan:

## 2014-03-07 ENCOUNTER — Ambulatory Visit (INDEPENDENT_AMBULATORY_CARE_PROVIDER_SITE_OTHER): Payer: Medicare Other | Admitting: Dietician

## 2014-03-07 ENCOUNTER — Encounter: Payer: Self-pay | Admitting: Dietician

## 2014-03-07 VITALS — Wt 290.8 lb

## 2014-03-07 DIAGNOSIS — E11329 Type 2 diabetes mellitus with mild nonproliferative diabetic retinopathy without macular edema: Secondary | ICD-10-CM

## 2014-03-07 DIAGNOSIS — E113299 Type 2 diabetes mellitus with mild nonproliferative diabetic retinopathy without macular edema, unspecified eye: Secondary | ICD-10-CM

## 2014-03-07 NOTE — Patient Instructions (Addendum)
Thanks for coming today.   Your weight today was: 290.8#  You did a great job keeping track of your blood sugars!!!  For the next few weeks to lower your blood sugars: you want to work on moving more and eating  Vegetables to help you decrease your fruit intake  Follow up as you determine or need.

## 2014-03-07 NOTE — Progress Notes (Signed)
  Medical Nutrition Therapy:  Appt start time: 1030 end time:  1150. 2nd visit Assessment:  Primary concerns today: "getting A1C down"  Patient brought back accuchek 360 sheet completed. She "discovered" her blood sugar are "too high": between 182 and 252, average 213 with consistently high fastings and variable during the day depending on her food intake and activity.  Learning Readiness:  Ready but unsure which options she wants to use- medication vs meal planning vs moving more  MEDICATIONS: noted, she dislikes checking blood sugar and the thought of taking injections DIETARY INTAKE: Usual eating pattern does not include fried foods, eats large portions of fruit trying to avoid sweets, salmon, regular soda and some sweet tea,   Usual physical activity: limited to walking at [place] which tires her out. [place] has foot and knees problems she is trying to sort out.   Progress Towards Goal(s):  In progress.   Nutritional Diagnosis:  NB-1.1 Food and nutrition-related knowledge deficit As related to lack of prior diabetes training is improving but needs much reinforcement.  As evidenced by her questions and interest..    Intervention:  Nutrition education about meter download, accucheck 360, meal planning and low impact, non weight bearing activities Coordination of care- encourage non weight gain medications to assist patient with goals if nay are needed, it appears her blood sugars con be controlled with lifestyle changes so encourage these first Teaching Method Utilized: Visual, Auditory, Hands on Handouts given during visit include: AVS Barriers to learning/adherence to lifestyle change: lack of knowledge, fear, lack of support, reluctance to start insulin/injectable medicine  Demonstrated degree of understanding via:  Teach Back   Monitoring/Evaluation:  Dietary intake, exercise, blood sugars, and body weight prn.

## 2014-03-16 ENCOUNTER — Encounter: Payer: Self-pay | Admitting: Podiatry

## 2014-03-16 ENCOUNTER — Ambulatory Visit (INDEPENDENT_AMBULATORY_CARE_PROVIDER_SITE_OTHER): Payer: Medicare Other | Admitting: Podiatry

## 2014-03-16 DIAGNOSIS — E1140 Type 2 diabetes mellitus with diabetic neuropathy, unspecified: Secondary | ICD-10-CM

## 2014-03-16 NOTE — Progress Notes (Signed)
Dispensed diabetic shoes and 3 pairs of insoles with instructions for wearing.

## 2014-03-16 NOTE — Patient Instructions (Signed)

## 2014-03-19 NOTE — Progress Notes (Signed)
Subjective:     Patient ID: Sonya Lane, female   DOB: 07/25/1950, 64 y.o.   MRN: [id number]  HPI patient presents to pickup diabetic shoes and insoles with significant reduction of nerve function noted and stating she gets a lot of tingling in her feet   Review of Systems     Objective:   Physical Exam Reviewed health history and did find stable but does have deformity    Assessment:     Neurovascular status diminished but unchanged from previous visit with long-term diabetes and structural abnormalities of both feet    Plan:     Dispensed diabetic shoes and insoles which fitted well and instructed on for.. Reappoint for regular visit

## 2014-03-30 ENCOUNTER — Telehealth: Payer: Self-pay | Admitting: Dietician

## 2014-03-30 NOTE — Telephone Encounter (Signed)
Called patient to follow up on goals from last [place] visit she is eating healthier, not checking blood sugars, does not have a scale to weigh herself. Trying to move some. She it interested in Silver sneaker- gave her the number to call to see if she is eligible. Reviewed her [place] benefits and encouraged future visits. She was not interested in making an appointment today, said she knows she is not going to do right over the holidays.  encouraged her that this is the time most need support to not let the holidays get them too far off track to reaching their goals. Marland Kitchen

## 2014-04-05 NOTE — Progress Notes (Signed)
Patient ID: Sonya Lane, female   DOB: [birth year], 63 y.o.   MRN: [id number]  Case discussed with Dr. Naaman Plummer at the time of the visit.  We reviewed the resident's history and exam and pertinent patient test results.  I agree with the assessment, diagnosis, and plan of care documented in the resident's note.

## 2014-07-13 ENCOUNTER — Ambulatory Visit (INDEPENDENT_AMBULATORY_CARE_PROVIDER_SITE_OTHER): Payer: Medicare Other | Admitting: Internal Medicine

## 2014-07-13 ENCOUNTER — Encounter: Payer: Self-pay | Admitting: Internal Medicine

## 2014-07-13 VITALS — BP 159/80 | HR 86 | Temp 98.2°F | Wt 280.1 lb

## 2014-07-13 DIAGNOSIS — E785 Hyperlipidemia, unspecified: Secondary | ICD-10-CM

## 2014-07-13 DIAGNOSIS — N898 Other specified noninflammatory disorders of vagina: Secondary | ICD-10-CM

## 2014-07-13 DIAGNOSIS — Z0000 Encounter for general adult medical examination without abnormal findings: Secondary | ICD-10-CM

## 2014-07-13 DIAGNOSIS — Z86010 Personal history of colonic polyps: Secondary | ICD-10-CM

## 2014-07-13 DIAGNOSIS — L298 Other pruritus: Secondary | ICD-10-CM

## 2014-07-13 DIAGNOSIS — I10 Essential (primary) hypertension: Secondary | ICD-10-CM

## 2014-07-13 DIAGNOSIS — E1142 Type 2 diabetes mellitus with diabetic polyneuropathy: Secondary | ICD-10-CM

## 2014-07-13 LAB — CBC WITH DIFFERENTIAL/PLATELET
BASOS PCT: 0 % (ref 0–1)
Basophils Absolute: 0 10*3/uL (ref 0.0–0.1)
Eosinophils Absolute: 0.3 10*3/uL (ref 0.0–0.7)
Eosinophils Relative: 4 % (ref 0–5)
HCT: 41.5 % (ref 36.0–46.0)
Hemoglobin: 14 g/dL (ref 12.0–15.0)
Lymphocytes Relative: 31 % (ref 12–46)
Lymphs Abs: 2.5 10*3/uL (ref 0.7–4.0)
MCH: 27 pg (ref 26.0–34.0)
MCHC: 33.7 g/dL (ref 30.0–36.0)
MCV: 80 fL (ref 78.0–100.0)
MPV: 12.1 fL (ref 8.6–12.4)
Monocytes Absolute: 0.5 10*3/uL (ref 0.1–1.0)
Monocytes Relative: 6 % (ref 3–12)
NEUTROS PCT: 59 % (ref 43–77)
Neutro Abs: 4.8 10*3/uL (ref 1.7–7.7)
Platelets: 278 10*3/uL (ref 150–400)
RBC: 5.19 MIL/uL — ABNORMAL HIGH (ref 3.87–5.11)
RDW: 15 % (ref 11.5–15.5)
WBC: 8.2 10*3/uL (ref 4.0–10.5)

## 2014-07-13 LAB — LIPID PANEL
Cholesterol: 211 mg/dL — ABNORMAL HIGH (ref 0–200)
HDL: 54 mg/dL (ref >39)
LDL CALC: 127 mg/dL — AB (ref 0–99)
TRIGLYCERIDES: 148 mg/dL (ref <150)
Total CHOL/HDL Ratio: 3.9 Ratio
VLDL: 30 mg/dL (ref 0–40)

## 2014-07-13 LAB — GLUCOSE, CAPILLARY: GLUCOSE-CAPILLARY: 189 mg/dL — AB (ref 70–99)

## 2014-07-13 LAB — POCT GLYCOSYLATED HEMOGLOBIN (HGB A1C): HEMOGLOBIN A1C: 7.1

## 2014-07-13 MED ORDER — FLUCONAZOLE 150 MG PO TABS: 150.0000 mg | ORAL_TABLET | Freq: Once | ORAL | Status: DC

## 2014-07-13 NOTE — Assessment & Plan Note (Addendum)
Assessment: Pt with last A1c of 8.8 on 02/09/14 compliant with dual oral hypoglycemic therapy with no symptomatic hypoglycemia who presents with CBG of 189 and significantly improved A1c of 7.1.  Plan:  -A1c 7.1 at goal <7. Continue metformin 1000 mg BID and glipizide 10 mg BID   -BP 159/80 not at goal <140/90,  Pt taking carvedilol 25 mg daily instead of BID, pt instructed to take carvedilol 25 mg twice a day, Continue amlodipine 10 mg daily and lisinopril-HCTZ 20-25 mg daily. -Obtain annual lipid panel, last LDL 132 not at goal <100, pt declines statin therapy due to intolerance -Last annual eye exam on 12/08/13 with no retinopathy  -Last annual foot exam and urine microalbumin (82.4g) on 10/21/13 -Continue gabapentin 300 mg TID for chronic peripheral neuropathy -BMI 45.89 not at goal <30, encourage weight loss

## 2014-07-13 NOTE — Assessment & Plan Note (Signed)
Assessment: Pt with moderately well-controlled hypertension partially complaint with four-class (CCB, ACEi, BB & diuretic) anti-hypertensive therapy who presents with blood pressure of 159/80.   Plan:  -BP 159/80 not at goal <140/90 - Pt taking carvedilol 25 mg daily instead of BID, pt instructed to take carvedilol 25 mg twice a day -Continue amlodipine 10 mg daily and lisinopril-HCTZ 20-25 mg daily -Obtain CMP

## 2014-07-13 NOTE — Patient Instructions (Signed)
-  Great job on improving your A1c from 8.8 to 7.1! -Take diflucan once for yeast infection -Start taking coreg twice a day for your high blood pressure  -Will refer you for colonoscopy -Will check your bloodwork today -Will see you back in 3 months, nice seeing you today!   General Instructions:   Please bring your medicines with you each time you come to clinic.  Medicines may include prescription medications, over-the-counter medications, herbal remedies, eye drops, vitamins, or other pills.   Progress Toward Treatment Goals:  Treatment Goal 03/07/2013  Hemoglobin A1C at goal  Blood pressure at goal    Self Care Goals & Plans:  Self Care Goal 02/09/2014  Manage my medications take my medicines as prescribed; bring my medications to every visit; refill my medications on time; follow the sick day instructions if I am sick  Monitor my health keep track of my blood pressure; check my feet daily  Eat healthy foods eat more vegetables; eat fruit for snacks and desserts; eat baked foods instead of fried foods; eat smaller portions; drink diet soda or water instead of juice or soda  Be physically active find an activity I enjoy  Meeting treatment goals -    Home Blood Glucose Monitoring 02/04/2013  Check my blood sugar no home glucose monitoring     Care Management & Community Referrals:  Referral 03/07/2013  Referrals made for care management support none needed  Referrals made to community resources weight management

## 2014-07-13 NOTE — Assessment & Plan Note (Signed)
-  Refer to GI for colonoscopy for 5-year repeat colonoscopy (2011 with 1 adenomatous transverse colon polyp) -Pt declined DEXA scan testing at this time

## 2014-07-13 NOTE — Progress Notes (Signed)
Patient ID: Sonya Lane, female   DOB: [birth year], 64 y.o.   MRN: [id number]    Subjective:   Patient ID: Sonya Lane female   DOB: [birth year] 64 y.o.   MRN: [id number]  HPI: Sonya Lane is a 64 y.o. woman with past medical history of hypertension, hyperlipidemia, non-insulin Type 2 DM, and NAFLD who presents for routine clinic visit.   Her last A1c was 8.8 on 02/09/14. She reports compliance with taking metformin 1000 mg BID and glipizide 10 mg BID. She does not check her blood glucose at home. She denies symptomatic hypoglycemia. She has chronic blurry vision, polyuria, polydipsia, polyphagia, and peripheral neuropathy controlled on gabapentin but denies foot injury/ulcer. She tries to follow a healthy diet but does not exercise much. She has lost 12 lb since last visit 5 months ago.    She reports compliance with taking amlodipine, lisinopril-HCTZ, and carvedilol for hypertension. She has been taking carvedilol only once a day instead of twice a day. She denies headache, chest pain, LE edema, or lightheadedness.    She reports having vaginal itching and urinary burning on and off since January with cottage cheese appearance and no discharge, hematuria, sexual activity, suprapubic pain, nausea, or vomiting. She reports having yeast infections in the past which resolved with diflucan. She is status post total hysterectomy.   She is no longer taking pravastatin for hyperlipidemia due to muscle cramping which she was taking twice weekly.   She denies recent fall or fracture and declines DEXA scan testing. She had colonoscopy in 2011 that revealed one transverse colon adenomatous polyp that was removed and is due to for 5-year repeat this year. She has chronic constipation but denies dark or bright red bloody stools.     Past Medical History  Diagnosis Date  . DIABETES MELLITUS, TYPE II 03/17/2006        . HYPERLIPIDEMIA 03/17/2006    Qualifier: Diagnosis of  By: Sharlet Salina  MD, [place]    . HYPERTENSION 03/17/2006        . Morbid obesity 01/25/2008    Qualifier: Diagnosis of  By: Phifer MD, Izora Gala     Current Outpatient Prescriptions  Medication Sig Dispense Refill  . amLODipine (NORVASC) 10 MG tablet Take 1 tablet (10 mg total) by mouth daily. 90 tablet 3  . aspirin 81 MG tablet Take 81 mg by mouth daily.      . Blood Glucose Monitoring Suppl (ONETOUCH VERIO IQ SYSTEM) W/DEVICE KIT Before meals and bedtime 1 kit 0  . carvedilol (COREG) 25 MG tablet Take 1 tablet (25 mg total) by mouth 2 (two) times daily with a meal. 180 tablet 3  . gabapentin (NEURONTIN) 300 MG capsule Take 1 capsule (300 mg total) by mouth 3 (three) times daily. 270 capsule 3  . glipiZIDE (GLUCOTROL) 10 MG tablet Take 1 tablet (10 mg total) by mouth 2 (two) times daily before a meal. 180 tablet 3  . glucose blood (ONETOUCH VERIO) test strip Check blood sugar before meals and bedtime as instructed 100 each 12  . lisinopril-hydrochlorothiazide (PRINZIDE,ZESTORETIC) 20-25 MG per tablet Take 1 tablet by mouth daily. 90 tablet 3  . metFORMIN (GLUCOPHAGE) 1000 MG tablet Take 1 tablet (1,000 mg total) by mouth 2 (two) times daily with a meal. 180 tablet 3  . Multiple Vitamin (MULTIVITAMIN) tablet Take 1 tablet by mouth daily.      . naproxen (NAPROSYN) 250 MG tablet Take 1 tablet (250 mg total) by mouth 2 (two) times  daily with a meal. 60 tablet 1  . ONETOUCH DELICA LANCETS FINE MISC Check blood sugar before meals and bedtime 100 each 12  . pravastatin (PRAVACHOL) 10 MG tablet Take one tab two times per week (Monday and Friday) 90 tablet 3   No current facility-administered medications for this visit.   No family history on file. History   Social History  . Marital Status: Single    Spouse Name: N/A  . Number of Children: N/A  . Years of Education: 44   Social History Main Topics  . Smoking status: Never Smoker   . Smokeless tobacco: Not on file  . Alcohol Use: No  . Drug Use: No  . Sexual  Activity: Not on file   Other Topics Concern  . Not on file   Social History Narrative   [name]  September 12, 2009 11:11 AM   Financial assistance approved for 40% discount at [facility] and has [facility] card   [employer]  Oct 02, 2009 2:20 PM   Review of Systems: Review of Systems  Constitutional: Positive for weight loss (intentional ). Negative for fever and chills.       Hot flashes  HENT: Negative for congestion and sore throat.   Eyes: Positive for blurred vision (chronic).  Respiratory: Positive for cough. Negative for shortness of breath and wheezing.   Cardiovascular: Negative for chest pain and leg swelling.  Gastrointestinal: Positive for constipation (chronic). Negative for nausea, vomiting, abdominal pain, diarrhea and blood in stool.  Genitourinary: Negative for urgency, frequency and hematuria. Dysuria: "burning"       Vaginal itching since for 1 month  Musculoskeletal: Negative for myalgias.  Neurological: Positive for dizziness and sensory change (chronic peripheral neuropathy controlled on gabapentin). Negative for headaches.  Endo/Heme/Allergies: Positive for polydipsia (chronic).    Objective:  Physical Exam: Filed Vitals:   07/13/14 1405  BP: 159/80  Pulse: 86  Temp: 98.2 F (36.8 C)  TempSrc: Oral  Weight: 280 lb 1.6 oz (127.053 kg)  SpO2: 99%    Physical Exam  Constitutional: She is oriented to person, place, and time. She appears well-developed and well-nourished. No distress.  HENT:  Head: Normocephalic and atraumatic.  Right Ear: External ear normal.  Left Ear: External ear normal.  Nose: Nose normal.  Mouth/Throat: Oropharynx is clear and moist. No oropharyngeal exudate.  Tongue coating with white residue on sides (chronic)  Eyes: Conjunctivae and EOM are normal. Pupils are equal, round, and reactive to light. Right eye exhibits no discharge. Left eye exhibits no discharge. No scleral icterus.  Neck: Normal range of motion. Neck supple.    Cardiovascular: Normal rate, regular rhythm and normal heart sounds.   Pulmonary/Chest: Effort normal and breath sounds normal. No respiratory distress. She has no wheezes. She has no rales.  Abdominal: Soft. Bowel sounds are normal. She exhibits no distension. There is no tenderness. There is no rebound and no guarding.  Musculoskeletal: Normal range of motion. She exhibits edema (trace b/l LE). She exhibits no tenderness.  Neurological: She is alert and oriented to person, place, and time.  Skin: Skin is warm and dry. No rash noted. She is not diaphoretic. No erythema. No pallor.  Psychiatric: She has a normal mood and affect. Her behavior is normal. Judgment and thought content normal.    Assessment & Plan:   Please see problem list for problem-based assessment and plan

## 2014-07-13 NOTE — Assessment & Plan Note (Addendum)
Assessment: Pt with more than 1.5 month history of vaginal itching with urinary burning in setting of Type 2 DM most likely with vaginal yeast infection.   Plan: -Obtain POCT urine dipstick ---->  glucosuria (100) with no hematuria or signs of infection -Prescribe empiric flucanazole 150 mg x 1 dose   -Obtain annual HIV Ab testing  -Obtain CBC w/diff -Pt instructed to return for pelvic exam if symptoms do not improve with medical therapy

## 2014-07-13 NOTE — Assessment & Plan Note (Signed)
Assessment: Pt with last lipid on 06/10/13 with hypercholesteremia noncompliant with statin therapy due to intolerance with 10-yr ASCVD risk of 34.2% with recommendations to continue moderate to high intensity statin therapy.   Plan: -Obtain annual lipid panel  -Pt declined re-starting statin therapy, will consider red yeast rice extract

## 2014-07-14 LAB — COMPLETE METABOLIC PANEL WITH GFR
ALBUMIN: 4.2 g/dL (ref 3.5–5.2)
ALK PHOS: 57 U/L (ref 39–117)
ALT: 63 U/L — ABNORMAL HIGH (ref 0–35)
AST: 44 U/L — AB (ref 0–37)
BILIRUBIN TOTAL: 0.5 mg/dL (ref 0.2–1.2)
BUN: 5 mg/dL — ABNORMAL LOW (ref 6–23)
CO2: 28 mEq/L (ref 19–32)
Calcium: 10 mg/dL (ref 8.4–10.5)
Chloride: 97 mEq/L (ref 96–112)
Creat: 0.7 mg/dL (ref 0.50–1.10)
GFR, Est African American: >89 mL/min
GFR, Est Non African American: >89 mL/min
Glucose, Bld: 170 mg/dL — ABNORMAL HIGH (ref 70–99)
Potassium: 4.1 mEq/L (ref 3.5–5.3)
SODIUM: 137 meq/L (ref 135–145)
Total Protein: 7 g/dL (ref 6.0–8.3)

## 2014-07-14 LAB — HIV ANTIBODY (ROUTINE TESTING W REFLEX): HIV: NONREACTIVE

## 2014-07-14 NOTE — Progress Notes (Signed)
Internal Medicine Clinic Attending Date of visit: 07/13/2014  Case discussed with Dr. Naaman Plummer  on the day of the visit.  We reviewed the resident's history and exam and pertinent patient test results.  I agree with the assessment, diagnosis, and plan of care documented in the resident's note.

## 2014-07-25 ENCOUNTER — Ambulatory Visit (INDEPENDENT_AMBULATORY_CARE_PROVIDER_SITE_OTHER): Payer: Medicare Other | Admitting: Dietician

## 2014-07-25 ENCOUNTER — Encounter: Payer: Self-pay | Admitting: Dietician

## 2014-07-25 DIAGNOSIS — E1142 Type 2 diabetes mellitus with diabetic polyneuropathy: Secondary | ICD-10-CM

## 2014-07-25 NOTE — Progress Notes (Signed)
  Medical Nutrition Therapy:  Appt start time: 1400 end time:  1500.  3nd visit- last visit was 03/2014 weight today- 180# and stable.   Assessment:  Primary concerns today: "vegetarian meal plan that can help with weight loss"  Patient lost 10# since last visit and lowered A1C to 7.1%. She is determined to eat healthier and decrease her weight to 180-200#. Wants a 1200 calorie diet to lose weight fast. Her son lost considerable weight by juicing but she does not want to do that. She reports she stopped testing her blood sugar over the holidays and recently checked it one time in the evening at home and it was 104. She is not exercising.   Learning Readiness:  Ready   MEDICATIONS: noted and without change DIETARY INTAKE: 24-hr recall suggests intake of 1200-1600 kcal:  B (8:15 AM)- pack of peanut butter crackers, 4 oz whole milk, coffee, tangerine or banana   L ( 12:30 PM)- [employer] noodles & kale and spinach, juice Snk ( PM)- fruit  D ( PM)- rice,  beans, okra, juice, bread  Snk ( PM)- homemade vegetable soup with her medicine  Typical day? Yes.  per patient   Usual physical activity:indicated she is not doing much and that she was not ready to discuss activity today.    Progress Towards Goal(s):  In progress.   Nutritional Diagnosis:  NB-1.1 Food and nutrition-related knowledge deficit As related to lack of prior diabetes training continues to improve and need reinforcement.  As evidenced by her questions and interest..    Intervention:  Nutrition education about importance of healthy eating and beverages,  smaller portions and , it appears her blood sugars con be controlled with lifestyle changes. Teaching Method Utilized: Visual, Auditory, Hands on Handouts given during visit include: AVS Barriers to learning/adherence to lifestyle change: lack of knowledge, fear, lack of support, reluctance to start insulin/injectable medicine  Demonstrated degree of understanding via:  Teach Back    Monitoring/Evaluation:  Dietary intake, exercise, blood sugars, and body weight in 4 week(s)

## 2014-07-25 NOTE — Patient Instructions (Signed)
Remember the juice and see you in a month!  Sonya Lane  [phone]

## 2014-08-16 DIAGNOSIS — K5901 Slow transit constipation: Secondary | ICD-10-CM | POA: Diagnosis not present

## 2014-08-21 ENCOUNTER — Telehealth: Payer: Self-pay | Admitting: Dietician

## 2014-08-21 NOTE — Telephone Encounter (Signed)
Call to patient to confirm appointment for 08/22/14 at 9:30 lmtcb

## 2014-08-22 ENCOUNTER — Other Ambulatory Visit: Payer: Self-pay | Admitting: Dietician

## 2014-08-22 ENCOUNTER — Ambulatory Visit (INDEPENDENT_AMBULATORY_CARE_PROVIDER_SITE_OTHER): Payer: Medicare Other | Admitting: Dietician

## 2014-08-22 VITALS — Wt 277.4 lb

## 2014-08-22 DIAGNOSIS — E1142 Type 2 diabetes mellitus with diabetic polyneuropathy: Secondary | ICD-10-CM

## 2014-08-22 NOTE — Progress Notes (Signed)
Request referral for DSMT/S and MNT for this year. Has done well previously with support. Lost 12 pounds and decreased a1C from 8.8 to 7.1%

## 2014-08-22 NOTE — Patient Instructions (Addendum)
Please be sure your multivitamin is for aged 66+ so it does not have too much vitamin A in it.   Your weight today is   277.4#    Your blood sugar was 177 after breakfast  If you cook or heat in the microwave, Microwave in glass containers instead of plastic.   Use refillable glasses instead of plastic water bottles. Tap water is better than bottled water.   You need about 90 grams of protein each day.  Protein comes from eggs, milk, cheese, nuts, seeds, beans, protein powder- hemp protein([employer])  Having enough protein is important to helping you with weight loss.  Step 1- Suggest writing down all the food you eat and how much you eat.  Step 2- look up how much protein each food has in it for the amunt you eat.  Did you eat 90 grams for that day? If not how can you add protein- make your oatmeal with milk?   Suggest follow up in 1 month.   Daily intake  - see handout

## 2014-08-22 NOTE — Progress Notes (Signed)
  Medical Nutrition Therapy:  Appt start time: 0930 end time:  1030.  4th visit- last visit was 07/25/14 Assessment:  Primary concerns today: healthy meal plan with no meat ( does eat eggs, milk and cheese)  and weight loss"  Patient lost 10# since last visit and lowered A1C to 7.1%. She is determined to eat healthier and decrease her weight to 180-200#. She reports she stopped testing her blood sugar. Does get dizzy at times and uses V-* to help her feel better Learning Readiness:  Ready   weight today- 177.4#, decreased 2.6# in past month CBG today 177 after coffee with bream and milk SELF Monitoring- "does not like to stick herself"  Medicine:adherent to both metformin and glipizide Hyper or Hypoglycemia- says she feels dizzy often, especially when she goes a long time without eating, drinks juice and feels better  DIETARY INTAKE: 24-hr recall not done today. Do not think she is eating enough protein.    Usual physical activity:not discussed today    Progress Towards Goal(s):  In progress.   Nutritional Diagnosis:  NB-1.1 Food and nutrition-related knowledge deficit As related to lack of prior diabetes training continues to improve and need reinforcement.  As evidenced by her questions and interest..    Intervention:  Nutrition education about importance of protein to weight loss and well being. Coordination of care- request trial of 1-2 days off or a decreased dose of glipizide to see if dizziness improves Teaching Method Utilized: Visual, Auditory, Hands on Handouts given during visit include: AVS, dash diet meal plan Barriers to learning/adherence to lifestyle change: glipizide may be causing undetectable hypoglycemia( dizziness)   Demonstrated degree of understanding via:  Teach Back   Monitoring/Evaluation:  Dietary intake, exercise, blood sugars, and body weight in 4 week(s)

## 2014-08-23 NOTE — Progress Notes (Signed)
Signed the referral, thanks Butch Penny!  Dr. Naaman Plummer

## 2014-08-24 ENCOUNTER — Telehealth: Payer: Self-pay | Admitting: Dietician

## 2014-08-24 DIAGNOSIS — Z09 Encounter for follow-up examination after completed treatment for conditions other than malignant neoplasm: Secondary | ICD-10-CM | POA: Diagnosis not present

## 2014-08-24 DIAGNOSIS — K5730 Diverticulosis of large intestine without perforation or abscess without bleeding: Secondary | ICD-10-CM | POA: Diagnosis not present

## 2014-08-24 DIAGNOSIS — Z86010 Personal history of colonic polyps: Secondary | ICD-10-CM | POA: Diagnosis not present

## 2014-08-24 NOTE — Telephone Encounter (Signed)
Discussed patient's reported dizzy spells with dr. Naaman Plummer. She ordered patient to decrease her glipizide dose to 2.5 mg/day. Patient says she cannot cut her 10 mg glipizide pill but says she will take it one time a day instead of twice a day to see if this helps her dizzy spells. She has a follow up with CDE 09/21/14.

## 2014-09-21 ENCOUNTER — Other Ambulatory Visit: Payer: Self-pay | Admitting: Dietician

## 2014-09-21 ENCOUNTER — Ambulatory Visit (INDEPENDENT_AMBULATORY_CARE_PROVIDER_SITE_OTHER): Payer: Medicare Other | Admitting: Dietician

## 2014-09-21 ENCOUNTER — Encounter: Payer: Self-pay | Admitting: Dietician

## 2014-09-21 VITALS — BP 125/62 | HR 75 | Wt 272.6 lb

## 2014-09-21 DIAGNOSIS — E1142 Type 2 diabetes mellitus with diabetic polyneuropathy: Secondary | ICD-10-CM

## 2014-09-21 NOTE — Progress Notes (Signed)
Medical Nutrition Therapy:  Appt start time: 0940 end time:  1040.  5th visit- last visit was 08/22/14 Assessment:  Primary concerns today: healthy meal plan with no meat  and weight loss  Patient lost 5# since last visit. She tried to incorporate more protein over the past month, did not add it up or read labels. She is determined to make lifestyle change to eat healthier and decrease her weight to 180-200#. She does not test blood sugar at home.. Dizziness better, had at least one episode of "heart beating load in her ears at night" after taking norvasc when she got up to use bathroom.  Learning Readiness:  Ready  And change in progress weight today- 172.6#, decreased 5# in past month CBG today 175 after coffee with bream and milk SELF Monitoring- "does not like to stick herself"  Medicine:adherent to  metformin and taking 10 mg glipizide at bedtime because she could not split it and did not want to be dizzy during day Hyper or Hypoglycemia- denies symptoms, we discussed that the rapid heartbeat could be low blood sugar at night with taking 10 mg glipizide and not eating  DIETARY INTAKE: 24-hr recall suggests intake of 1300-1500 kcal/day:  (Up at  7 am goes to bed ~ 11 PMAM) B ( 8-9 AM)- yogurt , 6 oz juice and coffee -   L ( 12 PM)- 2 eggs, feta cheese, salsa, water  Snk ( PM)- "capfull" of nuts  D ( PM)- greens or salad, cheese, sometimes bean soup, brown rice  Snk ( 7 PM)-  yogurt Typical day? Yes.   .  Usual physical activity: does chores and has been trying to do more moving and standing, used to walk and swim but had foot problems with walking and skin problems with swimming   Progress Towards Goal(s):  Some progress.   Nutritional Diagnosis:  NB-1.1 Food and nutrition-related knowledge deficit As related to lack of prior diabetes training continues to improve and need reinforcement.  As evidenced by her questions and interest..    Intervention:  Nutrition education about  importance of activity to maintaining weight loss and well being. Her goal is to go to the [facility] to walk 3 times a week   Coordination of care- request prescription for glipizide 2.5 or 5 mg BID Handouts given during visit include: AVS, cholesterol lowing blooklets Barriers to learning/adherence to lifestyle change: glipizide may be causing undetectable hypoglycemia(rapid heartbeat)   Demonstrated degree of understanding via:  Teach Back   Monitoring/Evaluation:  Dietary intake, exercise, blood sugars, and body weight in 3 week(s) Signed up for group weight loss in 3 weeks

## 2014-09-21 NOTE — Patient Instructions (Signed)
You are doing wonderful changing your diet:  I think your cholesterol will be better next time you have it checked   Your fast hearbeat could be low blood sugars, so please take the glipizde in the morning when you get the lower dose  Your cholesterol numbers Total Cholesterol  211 LDL (BAD) cholesterol- 127 HDL (Good) Cholesterol- 54  Your wight was 272.6# your weight is now down 20# since September 2015  Your blood sugar today was 175 after breakfast  I will ask your doctor for glipizide 5 mg twice a day  Try Quinoa- it is grain in the    rice [place] and has protein like meat.  Cottage cheese is also a good source of protein.  Old fashioned oats is a healthy grain.

## 2014-09-21 NOTE — Progress Notes (Signed)
Recommend 2nd referral in same year for additional MNT hours. (Order in this note for you to sign if you agree)

## 2014-09-21 NOTE — Patient Instructions (Addendum)
You are doing wonderful changing your diet:  I think your cholesterol will be better next time you have it checked   Your fast hearbeat could be low blood sugars, so please take the glipizde in the morning when you get the lower dose  Your cholesterol numbers Total Cholesterol  211 LDL (BAD) cholesterol- 127 HDL (Good) Cholesterol- 54  Your wight was 272.6# your weight is now down 20# since September 2015  Your blood sugar today was 175 after breakfast  I will ask your doctor for glipizide 5 mg twice a day  Try Quinoa- it is grain in the    [place] and has protein like meat.  Cottage cheese is also a good source of protein.  Old fashioned oats is a healthy grain.

## 2014-09-22 ENCOUNTER — Other Ambulatory Visit: Payer: Self-pay | Admitting: Internal Medicine

## 2014-09-22 DIAGNOSIS — E113299 Type 2 diabetes mellitus with mild nonproliferative diabetic retinopathy without macular edema, unspecified eye: Secondary | ICD-10-CM

## 2014-09-22 DIAGNOSIS — I10 Essential (primary) hypertension: Secondary | ICD-10-CM

## 2014-09-22 MED ORDER — GLIPIZIDE 5 MG PO TABS: 10.0000 mg | ORAL_TABLET | Freq: Two times a day (BID) | ORAL | Status: DC

## 2014-09-22 MED ORDER — AMLODIPINE BESYLATE 10 MG PO TABS: 10.0000 mg | ORAL_TABLET | Freq: Every day | ORAL | Status: DC

## 2014-09-22 NOTE — Progress Notes (Signed)
Yes I signed it, thank you!  Dr. Naaman Plummer

## 2014-10-11 ENCOUNTER — Telehealth: Payer: Self-pay | Admitting: Dietician

## 2014-10-11 NOTE — Telephone Encounter (Signed)
Call to patient to confirm appointment for 10/12/14 at 9:30 lmtcb

## 2014-10-12 ENCOUNTER — Encounter: Payer: Self-pay | Admitting: Dietician

## 2014-10-12 ENCOUNTER — Ambulatory Visit (INDEPENDENT_AMBULATORY_CARE_PROVIDER_SITE_OTHER): Payer: Medicare Other | Admitting: Dietician

## 2014-10-12 VITALS — Wt 267.8 lb

## 2014-10-12 DIAGNOSIS — Z713 Dietary counseling and surveillance: Secondary | ICD-10-CM | POA: Diagnosis not present

## 2014-10-12 DIAGNOSIS — E1142 Type 2 diabetes mellitus with diabetic polyneuropathy: Secondary | ICD-10-CM

## 2014-10-12 DIAGNOSIS — E119 Type 2 diabetes mellitus without complications: Secondary | ICD-10-CM

## 2014-10-12 NOTE — Progress Notes (Addendum)
Medical Nutrition Therapy:  Appt start time: 0930 end time:  1030. 6th visit- group today with 2 others Assessment:  Primary concerns today: healthy meal plan with no meat  and weight loss  Patient participated in a group session today. The group discussed and shared how activity and healthy choices as well as portion sizes and support are important for health and weight loss. Topics covered: Non meat sources of protein, effects of no activity, effects of weight on diabetes  Learning Readiness:  Ready/change in progress weight today- 267.8#, decreased 4.8# in past 21 days, 23# in past 7 months CBG today not done today SELF Monitoring- does not want to do  Medicine: is waiting for her appointment to find out about her medicine change.  DIETARY INTAKE: not done today, patient is using Quinoa added to beans to increase her protein Usual physical activity: does chores and has been trying to do more moving and standing, reports she is having a hard time with exercising. Gets tired easily. [facility] not go to [employer] [facility]  Progress Towards Goal(s):  Some progress.   Nutritional Diagnosis:  NB-1.1 Food and nutrition-related knowledge deficit As related to lack of prior diabetes training continues to improve and need reinforcement.  As evidenced by her questions and interest..    Intervention:  Nutrition cousnleing about options for activity to maintaining weight loss and well being. Handouts given during visit include: AVS Barriers to learning/adherence to lifestyle change:finances, difficulty with incorporating activity  Demonstrated degree of understanding via:  Teach Back   Monitoring/Evaluation:  Dietary intake, exercise, blood sugars, and body weight in 2 week(s) Signed up for group weight loss in 2 weeks

## 2014-10-12 NOTE — Patient Instructions (Signed)
Please make a follow up in 2 weeks for Thursday at 930 for group.   Keep up the great work of being active and eating healthier!

## 2014-10-13 ENCOUNTER — Encounter: Payer: Self-pay | Admitting: Internal Medicine

## 2014-10-13 ENCOUNTER — Ambulatory Visit (INDEPENDENT_AMBULATORY_CARE_PROVIDER_SITE_OTHER): Payer: Medicare Other | Admitting: Internal Medicine

## 2014-10-13 VITALS — BP 155/84 | HR 80 | Temp 97.8°F | Ht 65.0 in | Wt 269.6 lb

## 2014-10-13 DIAGNOSIS — E1142 Type 2 diabetes mellitus with diabetic polyneuropathy: Secondary | ICD-10-CM | POA: Diagnosis not present

## 2014-10-13 DIAGNOSIS — M2141 Flat foot [pes planus] (acquired), right foot: Secondary | ICD-10-CM

## 2014-10-13 DIAGNOSIS — Z6841 Body Mass Index (BMI) 40.0 and over, adult: Secondary | ICD-10-CM | POA: Diagnosis not present

## 2014-10-13 DIAGNOSIS — E1165 Type 2 diabetes mellitus with hyperglycemia: Secondary | ICD-10-CM

## 2014-10-13 DIAGNOSIS — I10 Essential (primary) hypertension: Secondary | ICD-10-CM

## 2014-10-13 DIAGNOSIS — M2142 Flat foot [pes planus] (acquired), left foot: Secondary | ICD-10-CM

## 2014-10-13 DIAGNOSIS — Z0000 Encounter for general adult medical examination without abnormal findings: Secondary | ICD-10-CM

## 2014-10-13 LAB — MICROALBUMIN / CREATININE URINE RATIO
Creatinine, Urine: 41.8 mg/dL
MICROALB UR: 5 mg/dL — AB (ref <2.0)
MICROALB/CREAT RATIO: 119.6 mg/g — AB (ref 0.0–30.0)

## 2014-10-13 LAB — POCT GLYCOSYLATED HEMOGLOBIN (HGB A1C): HEMOGLOBIN A1C: 7.5

## 2014-10-13 LAB — GLUCOSE, CAPILLARY: GLUCOSE-CAPILLARY: 271 mg/dL — AB (ref 65–99)

## 2014-10-13 MED ORDER — METFORMIN HCL 1000 MG PO TABS: 1000.0000 mg | ORAL_TABLET | Freq: Two times a day (BID) | ORAL | Status: DC

## 2014-10-13 MED ORDER — GLIPIZIDE ER 5 MG PO TB24: 5.0000 mg | ORAL_TABLET | Freq: Every day | ORAL | Status: DC

## 2014-10-13 NOTE — Assessment & Plan Note (Signed)
Assessment: Pt with morbid obesity with BMI 44.86 who presents with 11lb weight loss and difficulty exercising due to bilateral foot pain.  Plan:  -BMI 44.86 not at goal <25   -Continue dietary and weight loss counseling with Sonya Lane   -Refer to podiatry for orthotics to aid in exercise

## 2014-10-13 NOTE — Patient Instructions (Addendum)
-  Your A1c is 7.5 (last time was 7.1). Start taking glipizide extended release 5 mg daily and continue metformin 1000 mg twice a day, these are free at [facility] about starting cholesterol medication for next time -You are due for screening mammogram next year -Will refer you to podiatry for shoe inserts  -Please schedule an eye exam for July with Butch Penny -Very nice seeing you, please come back in 3 months   General Instructions:   Please bring your medicines with you each time you come to clinic.  Medicines may include prescription medications, over-the-counter medications, herbal remedies, eye drops, vitamins, or other pills.   Progress Toward Treatment Goals:  Treatment Goal 03/07/2013  Hemoglobin A1C at goal  Blood pressure at goal    Self Care Goals & Plans:  Self Care Goal 10/13/2014  Manage my medications take my medicines as prescribed; bring my medications to every visit; refill my medications on time; follow the sick day instructions if I am sick  Monitor my health keep track of my blood pressure; keep track of my weight; check my feet daily  Eat healthy foods eat more vegetables; eat fruit for snacks and desserts; eat foods that are low in salt; eat baked foods instead of fried foods; eat smaller portions; drink diet soda or water instead of juice or soda  Be physically active find an activity I enjoy  Meeting treatment goals -    Home Blood Glucose Monitoring 02/04/2013  Check my blood sugar no home glucose monitoring     Care Management & Community Referrals:  Referral 03/07/2013  Referrals made for care management support none needed  Referrals made to community resources weight management

## 2014-10-13 NOTE — Assessment & Plan Note (Signed)
Assessment: Pt with morbid obesity and pes planus with left plantar spur on xray imaging July 2015 with no recent trauma or injury who presents with bilateral foot pain.   Plan:  -Refer to podiatry for further management including orthotics  -Encourage weight loss

## 2014-10-13 NOTE — Assessment & Plan Note (Addendum)
Assessment: Pt with last A1c of 7.1 on 07/13/14 semi-compliant with oral hypoglycemic therapy with no recent symptomatic hypoglycemia who presents with CBG of 271 and slightly worsened A1c of 7.5.  Plan:  -A1c 7.5 not at goal <7. Continue metformin 1000 mg BID, discontinue glipizide 10 mg BID, and start glipizide ER 5 mg daily   -BP 155/84 not at goal <140/90, continue carvedilol 25 mg BID, amlodipine 10 mg daily and lisinopril-HCTZ 20-25 mg daily, consider increasing lisinopril from 20 mg daily to 40 mg daily if uncontrolled at next visit  -LDL 127 not at goal <100, pt declines statin therapy due to intolerance and also declines alternatives (zetia, red yeast rice extract, niacin) at this time -Last annual eye exam on 12/08/13 with no retinopathy, repeat in July 2016 -Perform annual foot exam and and urine microalbumin test today  -Continue gabapentin 300 mg TID for chronic peripheral neuropathy -BMI 44.86 not at goal <25, encourage weight loss

## 2014-10-13 NOTE — Assessment & Plan Note (Addendum)
Assessment: Pt with moderately well-controlled hypertension compliant with four-class (CCB, ACEi, BB & diuretic) anti-hypertensive therapy who presents with blood pressure of 155/84.   Plan:  -BP 155/84 not at goal <140/90 -Continue carvedilol 25 mg BID, amlodipine 10 mg daily, and lisinopril-HCTZ 20-25 mg daily, consider increasing lisinopril to 40 mg daily if uncontrolled at next visit -Last CMP on 07/13/14 normal (chronically elevated AST & ALT)

## 2014-10-13 NOTE — Assessment & Plan Note (Addendum)
-  Pt declines DEXA scan, shingles vaccine, and PCV-13 vaccine at this time -Obtain records from recent screening colonoscopy -Pt due for screening mammogram in 1 year (June 2017) -Obtain screening TSH and vitamin D 25-OH level at next visit

## 2014-10-13 NOTE — Progress Notes (Signed)
Patient ID: Sonya Lane, female   DOB: Feb 03, 1951, 64 y.o.   MRN: [id number]    Subjective:   Patient ID: Sonya Lane female   DOB: 1950/09/19 64 y.o.   MRN: [id number]  HPI: [name] Lensing is a 64 y.o. very pleasant woman with past medical history of hypertension, hyperlipidemia, non-insulin Type 2 DM, and NAFLD who presents for follow-up visit of diabetes.  Her last A1c was 7.1 on 07/13/14. She reports compliance with taking metformin 1000 mg BID but has not been taking glipizide due to dizziness (was taking 10 mg daily before she stopped) from probable symptomatic hypoglycemia. She does not check her blood glucose at home.  She has chronic blurry vision, polyuria, polydipsia, polyphagia, and peripheral neuropathy controlled on gabapentin but denies foot injury/ulcer. She tries to follow a healthy diet but does not exercise much due to bilateral foot pain. She has a history of pes planus and has seen podiatry in the past. She has diabetic shoes but no specific shoe inserts for arch support. She has lost 11 lb since last visit 3 months ago.   She reports compliance with taking amlodipine, lisinopril-HCTZ, and carvedilol for hypertension. She denies headache, chest pain, or  LE edema.   She declines starting statin therapy for hyperlipidemia due to history of myalgias in the past.    She reports having a colonoscopy in March that was normal. She declines DEXA scan, pneumonia vaccine, or zoster vaccination. She had normal mammogram last year and prefers every 2 year screening mammograms.       Past Medical History  Diagnosis Date  . DIABETES MELLITUS, TYPE II 03/17/2006        . HYPERLIPIDEMIA 03/17/2006    Qualifier: Diagnosis of  By: Sharlet Salina MD, [place]    . HYPERTENSION 03/17/2006        . Morbid obesity 01/25/2008    Qualifier: Diagnosis of  By: Phifer MD, Izora Gala     Current Outpatient Prescriptions  Medication Sig Dispense Refill  . amLODipine (NORVASC) 10 MG tablet  Take 1 tablet (10 mg total) by mouth daily. 90 tablet 3  . aspirin 81 MG tablet Take 81 mg by mouth daily.      . Blood Glucose Monitoring Suppl (ONETOUCH VERIO IQ SYSTEM) W/DEVICE KIT Before meals and bedtime 1 kit 0  . carvedilol (COREG) 25 MG tablet Take 1 tablet (25 mg total) by mouth 2 (two) times daily with a meal. 180 tablet 3  . fluconazole (DIFLUCAN) 150 MG tablet Take 1 tablet (150 mg total) by mouth once. Take 1 pill for yeast infection 5 tablet 0  . gabapentin (NEURONTIN) 300 MG capsule Take 1 capsule (300 mg total) by mouth 3 (three) times daily. 270 capsule 3  . glipiZIDE (GLUCOTROL) 5 MG tablet Take 2 tablets (10 mg total) by mouth 2 (two) times daily before a meal. 60 tablet 3  . glucose blood (ONETOUCH VERIO) test strip Check blood sugar before meals and bedtime as instructed 100 each 12  . lisinopril-hydrochlorothiazide (PRINZIDE,ZESTORETIC) 20-25 MG per tablet Take 1 tablet by mouth daily. 90 tablet 3  . metFORMIN (GLUCOPHAGE) 1000 MG tablet Take 1 tablet (1,000 mg total) by mouth 2 (two) times daily with a meal. 180 tablet 3  . Multiple Vitamin (MULTIVITAMIN) tablet Take 1 tablet by mouth daily.      . naproxen (NAPROSYN) 250 MG tablet Take 1 tablet (250 mg total) by mouth 2 (two) times daily with a meal. 60 tablet 1  .  ONETOUCH DELICA LANCETS FINE MISC Check blood sugar before meals and bedtime 100 each 12  . pravastatin (PRAVACHOL) 10 MG tablet Take one tab two times per week (Monday and Friday) 90 tablet 3   No current facility-administered medications for this visit.   No family history on file. History   Social History  . Marital Status: Single    Spouse Name: N/A  . Number of Children: N/A  . Years of Education: 62   Social History Main Topics  . Smoking status: Never Smoker   . Smokeless tobacco: Not on file  . Alcohol Use: No  . Drug Use: No  . Sexual Activity: Not on file   Other Topics Concern  . Not on file   Social History Narrative   Sonya Lane   September 12, 2009 11:11 AM   Financial assistance approved for 40% discount at [facility] and has [facility] card   [employer]  Oct 02, 2009 2:20 PM   Review of Systems: Review of Systems  Constitutional: Positive for weight loss (intentional ). Negative for fever and chills.  HENT: Negative for congestion and sore throat.   Eyes: Positive for blurred vision (chronic).  Respiratory: Negative for cough, shortness of breath and wheezing.   Cardiovascular: Positive for leg swelling (trace in right LE). Negative for chest pain.  Gastrointestinal: Negative for nausea, vomiting, abdominal pain, diarrhea, constipation and blood in stool.  Genitourinary: Negative for dysuria, urgency and frequency.  Musculoskeletal: Positive for joint pain (bilateral foot pain). Negative for myalgias.  Neurological: Positive for sensory change (chronic peripheral neuropathy). Negative for dizziness and headaches.  Endo/Heme/Allergies: Positive for polydipsia (chronic).    Objective:  Physical Exam: Filed Vitals:   10/13/14 1326  BP: 155/84  Pulse: 80  Temp: 97.8 F (36.6 C)  TempSrc: Oral  Height: 5' 5"  (1.651 m)  Weight: 269 lb 9.6 oz (122.29 kg)  SpO2: 99%    Physical Exam  Constitutional: She is oriented to person, place, and time. She appears well-developed and well-nourished. No distress.  HENT:  Head: Normocephalic and atraumatic.  Right Ear: External ear normal.  Left Ear: External ear normal.  Nose: Nose normal.  Mouth/Throat: Oropharynx is clear and moist. No oropharyngeal exudate.  Eyes: Conjunctivae and EOM are normal. Pupils are equal, round, and reactive to light. Right eye exhibits no discharge. Left eye exhibits no discharge. No scleral icterus.  Neck: Normal range of motion. Neck supple.  Cardiovascular: Normal rate, regular rhythm and normal heart sounds.   Pulmonary/Chest: Effort normal and breath sounds normal. No respiratory distress. She has no wheezes. She has no rales.  Abdominal: Soft.  Bowel sounds are normal. She exhibits no distension. There is no tenderness. There is no rebound and no guarding.  Musculoskeletal: Normal range of motion. She exhibits no edema or tenderness.  Neurological: She is alert and oriented to person, place, and time.  Skin: Skin is warm and dry. No rash noted. She is not diaphoretic. No erythema. No pallor.  Psychiatric: She has a normal mood and affect. Her behavior is normal. Judgment and thought content normal.     Assessment & Plan:   Please see problem list for problem-based assessment and plan

## 2014-10-18 NOTE — Progress Notes (Signed)
Case discussed with Dr. Naaman Plummer soon after the resident saw the patient.  We reviewed the resident's history and exam and pertinent patient test results.  I agree with the assessment, diagnosis and plan of care documented in the resident's note.

## 2014-10-19 ENCOUNTER — Ambulatory Visit (INDEPENDENT_AMBULATORY_CARE_PROVIDER_SITE_OTHER): Payer: Medicare Other | Admitting: Podiatry

## 2014-10-19 ENCOUNTER — Encounter: Payer: Self-pay | Admitting: Podiatry

## 2014-10-19 VITALS — BP 148/92 | HR 84 | Resp 16

## 2014-10-19 DIAGNOSIS — E1140 Type 2 diabetes mellitus with diabetic neuropathy, unspecified: Secondary | ICD-10-CM | POA: Diagnosis not present

## 2014-10-19 DIAGNOSIS — M779 Enthesopathy, unspecified: Secondary | ICD-10-CM

## 2014-10-20 NOTE — Progress Notes (Signed)
Subjective:     Patient ID: Sonya Lane, female   DOB: May 06, 1951, 64 y.o.   MRN: [id number]  HPI patient presents stating I need new inserts I get pain in my feet   Review of Systems     Objective:   Physical Exam Neurovascular status intact muscle strength adequate range of motion within normal limits with moderate depression of the arch noted bilateral and pain within the fascial band bilateral    Assessment:     Tendinitis of both feet secondary to foot structure    Plan:     Recommended supportive shoes insoles and scanned for custom orthotics which patient wants to hold off on the current time reappoint to recheck

## 2014-10-26 ENCOUNTER — Ambulatory Visit (INDEPENDENT_AMBULATORY_CARE_PROVIDER_SITE_OTHER): Payer: Medicare Other | Admitting: Dietician

## 2014-10-26 VITALS — Wt 267.7 lb

## 2014-10-26 DIAGNOSIS — E119 Type 2 diabetes mellitus without complications: Secondary | ICD-10-CM | POA: Diagnosis not present

## 2014-10-26 DIAGNOSIS — E1142 Type 2 diabetes mellitus with diabetic polyneuropathy: Secondary | ICD-10-CM

## 2014-10-26 DIAGNOSIS — Z713 Dietary counseling and surveillance: Secondary | ICD-10-CM | POA: Diagnosis not present

## 2014-10-26 NOTE — Patient Instructions (Signed)
Your goal for the next 4 weeks is to Exercise.  Consider doing the write the problem in detail on the handout provided today.  Your blood sugar was 180 today after breakfast.  See you in 2 weeks

## 2014-10-27 NOTE — Progress Notes (Signed)
Medical Nutrition Therapy:  Appt start time: 0930 end time:  1030. 7th visit- group today with 1 other Assessment:  Primary concerns today: healthy meal plan with no meat  and weight loss  Patient participated in a group session today. Topics covered: Non meat sources of protein, importance of amount of carb at meals and snacks to blood sugar controleffects of no activity, effects of weight on diabetes . She does not want to come more than 1 time a month Learning Readiness:  Ready/change in progress weight today- 267.7#, decreased 0.1# in past 14 days, 23# in past 7 months CBG 180 today after breakfast, A1C increased since starting vegetarian diet from 7.1 to 7.5 SELF Monitoring- does not  do Medicine: decreased to glipizide extended release 5 mg/day.  DIETARY INTAKE: not done today, patient is using Quinoa added to beans to increase her protein Usual physical activity: does chores and has been trying to do more moving and standing, reports she is having a hard time with exercising. Gets tired easily. Did not go to [employer] [facility]  Progress Towards Goal(s):  Some progress.   Nutritional Diagnosis:  NB-1.1 Food and nutrition-related knowledge deficit As related to lack of prior diabetes training continues to improve and need reinforcement.  As evidenced by her questions and interest..    Intervention:  Nutrition counseling about options for activity for improving blood sugars and  maintaining weight loss and well being. Handouts given during visit include: AVS Barriers to learning/adherence to lifestyle change:finances, difficulty with incorporating activity  Demonstrated degree of understanding via:  Teach Back   Monitoring/Evaluation:  Dietary intake, exercise, blood sugars, and body weight in 4 week(s) Signed up for group weight loss in 2 weeks

## 2014-11-27 ENCOUNTER — Other Ambulatory Visit: Payer: Self-pay

## 2014-11-30 ENCOUNTER — Ambulatory Visit: Payer: Medicare Other | Admitting: Dietician

## 2014-11-30 ENCOUNTER — Ambulatory Visit (INDEPENDENT_AMBULATORY_CARE_PROVIDER_SITE_OTHER): Payer: Medicare Other | Admitting: Dietician

## 2014-11-30 ENCOUNTER — Encounter: Payer: Self-pay | Admitting: Dietician

## 2014-11-30 DIAGNOSIS — Z713 Dietary counseling and surveillance: Secondary | ICD-10-CM | POA: Diagnosis not present

## 2014-11-30 DIAGNOSIS — E1142 Type 2 diabetes mellitus with diabetic polyneuropathy: Secondary | ICD-10-CM

## 2014-11-30 DIAGNOSIS — E669 Obesity, unspecified: Secondary | ICD-10-CM | POA: Diagnosis not present

## 2014-11-30 DIAGNOSIS — E119 Type 2 diabetes mellitus without complications: Secondary | ICD-10-CM

## 2014-11-30 NOTE — Patient Instructions (Addendum)
Your next A1C is due on 01/13/15.  Your weight today was 267.1, this is decreased from  267.7 last month and 292.3 in 01/2014. You have lost 25 pounds.    Tuna:  About 14.5 ounces of light tuna or about 5 ounces of white tuna per week should be OK.   To help me lose weight I will  Try to be more active. Consider joining a group

## 2014-11-30 NOTE — Progress Notes (Signed)
Medical Nutrition Therapy:  Appt start time: 0930 end time:  1030. 8th visit Assessment:  Primary concerns today: healthy meal plan  and weight loss  Patient did not want to check her blood sugar today. She has not started walking or exercising more. She still has dizzy spells, Leg/foot pain barrier to exercsing Calorie counting/food recording done today with her to help her with contued weight loss/learn how to do at home, fruit portions and carbs and recommended number of servings per day Learning Readiness:  Ready/change in progress weight today- 267.1#, decreased 0.5# in past 30 days, possibly hitting a plateau    SELF Monitoring- does not  do Medicine: decreased to glipizide extended release 5 mg/day.  DIETARY INTAKE: 24-hr recall suggests intake of 1200-1500 kcal/day:  B ( 8 M)- coffee with 4 oz no fat milk, 7 oz juice  Snk ( 10 AM)-small banana   L ( 11-12 PM)- BIG salad with tomato and cucumber, ~ 2 TBSP light ranch dressing, cottage or feta cheese, sometimes 2 eggs  Snk ( PM)-Potato & salsa and tangerines x3   D ( PM)- Quinoa with oil, peppers, onions and broccoli, chicken or fish twice a week  Snk ( PM)- Activia yogurt, was also eating ice cream from her birthday  Typical day? Yes.    Usual physical activity: does chores and has been trying to do more moving, but denies getting adequate activity.   Progress Towards Goal(s):  Some progress.   Nutritional Diagnosis:  NB-1.1 Food and nutrition-related knowledge deficit As related to lack of prior diabetes training continues to improve and need reinforcement. As evidenced by her questions and interest..    Intervention:  Nutrition counseling about options for activity for improving blood sugars and  maintaining weight loss and well being. Handouts given during visit include: AVS, diabetes self management magazine, calorie counting book Barriers to learning/adherence to lifestyle change:She still has dizzy spells, Leg/foot pain  barrier to exercising  Demonstrated degree of understanding via:  Teach Back   Monitoring/Evaluation:  Dietary intake, exercise, and body weight in 4 week(s) Signed up for group.

## 2014-12-07 ENCOUNTER — Other Ambulatory Visit: Payer: Self-pay | Admitting: *Deleted

## 2014-12-07 DIAGNOSIS — G629 Polyneuropathy, unspecified: Secondary | ICD-10-CM

## 2014-12-07 DIAGNOSIS — E1142 Type 2 diabetes mellitus with diabetic polyneuropathy: Secondary | ICD-10-CM

## 2014-12-07 DIAGNOSIS — I10 Essential (primary) hypertension: Secondary | ICD-10-CM

## 2014-12-07 NOTE — Telephone Encounter (Signed)
Insurance is changing pt's pharm, they need all new scripts

## 2014-12-08 MED ORDER — GABAPENTIN 300 MG PO CAPS: 300.0000 mg | ORAL_CAPSULE | Freq: Three times a day (TID) | ORAL | Status: DC

## 2014-12-08 MED ORDER — LISINOPRIL-HYDROCHLOROTHIAZIDE 20-25 MG PO TABS: 1.0000 | ORAL_TABLET | Freq: Every day | ORAL | Status: DC

## 2014-12-08 MED ORDER — CARVEDILOL 25 MG PO TABS: 25.0000 mg | ORAL_TABLET | Freq: Two times a day (BID) | ORAL | Status: DC

## 2014-12-08 MED ORDER — GLIPIZIDE ER 5 MG PO TB24: 5.0000 mg | ORAL_TABLET | Freq: Every day | ORAL | Status: DC

## 2014-12-08 MED ORDER — AMLODIPINE BESYLATE 10 MG PO TABS: 10.0000 mg | ORAL_TABLET | Freq: Every day | ORAL | Status: DC

## 2014-12-08 MED ORDER — METFORMIN HCL 1000 MG PO TABS: 1000.0000 mg | ORAL_TABLET | Freq: Two times a day (BID) | ORAL | Status: DC

## 2014-12-13 ENCOUNTER — Telehealth: Payer: Self-pay | Admitting: *Deleted

## 2014-12-13 NOTE — Telephone Encounter (Signed)
Pt called - has area near buttock bleeding and ? white drainage. Has hx of hemmorrhoids. Suggest for pt to be seen today - prefers to wait. No problems with BM. Suggest using witch hazel or Tucks to area. Area is sore to touch. Suggest warm soaks to area in case it is a boil. Suggest for pt to be seen before weekend. Hilda Blades Jakyiah Briones RN 12/13/14 1:20PM

## 2014-12-28 ENCOUNTER — Ambulatory Visit (INDEPENDENT_AMBULATORY_CARE_PROVIDER_SITE_OTHER): Payer: Medicare Other | Admitting: Dietician

## 2014-12-28 ENCOUNTER — Encounter: Payer: Self-pay | Admitting: Dietician

## 2014-12-28 VITALS — Wt 269.4 lb

## 2014-12-28 DIAGNOSIS — Z713 Dietary counseling and surveillance: Secondary | ICD-10-CM

## 2014-12-28 DIAGNOSIS — E1142 Type 2 diabetes mellitus with diabetic polyneuropathy: Secondary | ICD-10-CM

## 2014-12-28 DIAGNOSIS — E669 Obesity, unspecified: Secondary | ICD-10-CM | POA: Diagnosis not present

## 2014-12-28 DIAGNOSIS — E1165 Type 2 diabetes mellitus with hyperglycemia: Secondary | ICD-10-CM

## 2014-12-28 NOTE — Patient Instructions (Signed)
Your blood sugar was 267 today. This is too high.  What can you do to lower your blood sugar?   Sonya Lane???  Meal plan  400 calories per meal x 3 = 1200 calories 100 calories per snack x 3= 300 calories

## 2014-12-28 NOTE — Progress Notes (Signed)
Medical Nutrition Therapy:  Appt start time: 0930 end time:  1030. 8th visit Assessment:  Primary concerns today: healthy meal plan  and weight loss  Patient attended a  weight loss Medical Nutrition Therapy group for diabetes. Eduction included how to incorporate more no meat foods into the meal plan, importance of daily activity and group support. " I am going [facility] get to [facility]"  Learning Readiness:  contmeplating weight today- 269.4#, increased 1.5# in past 30 days, has hit a plateau    SELF Monitoring- does not  Do, CBG here today : 267 Medicine: decreased to glipizide extended release 5 mg/day.  DIETARY INTAKE: eating more meat and attributes her with gain to this.   Usual physical activity: does chores and has been trying to do more moving, but denies getting adequate activity.   Progress Towards Goal(s):  Some progress.   Nutritional Diagnosis:  NB-1.1 Food and nutrition-related knowledge deficit As related to lack of prior diabetes training continues to improve and need reinforcement. As evidenced by her questions and interest..    Intervention:  Nutrition counseling about options for activity for improving blood sugars and  maintaining weight loss and well being. Handouts given during visit include: AVS Barriers to learning/adherence to lifestyle change: Leg/foot pain barrier to exercising  Demonstrated degree of understanding via:  Teach Back   Monitoring/Evaluation:  Dietary intake, exercise, and body weight in 4 week(s) Signed up for group.

## 2015-01-18 ENCOUNTER — Telehealth: Payer: Self-pay | Admitting: Internal Medicine

## 2015-01-18 NOTE — Telephone Encounter (Signed)
Call to patient to confirm appointment for 01/19/15 at 1:15 lmtcb

## 2015-01-19 ENCOUNTER — Ambulatory Visit (INDEPENDENT_AMBULATORY_CARE_PROVIDER_SITE_OTHER): Payer: Medicare Other | Admitting: Internal Medicine

## 2015-01-19 ENCOUNTER — Encounter: Payer: Self-pay | Admitting: Internal Medicine

## 2015-01-19 VITALS — BP 143/80 | HR 83 | Temp 97.5°F | Ht 65.0 in | Wt 270.5 lb

## 2015-01-19 DIAGNOSIS — I10 Essential (primary) hypertension: Secondary | ICD-10-CM | POA: Diagnosis not present

## 2015-01-19 DIAGNOSIS — E1142 Type 2 diabetes mellitus with diabetic polyneuropathy: Secondary | ICD-10-CM

## 2015-01-19 DIAGNOSIS — K760 Fatty (change of) liver, not elsewhere classified: Secondary | ICD-10-CM | POA: Diagnosis not present

## 2015-01-19 DIAGNOSIS — Z0000 Encounter for general adult medical examination without abnormal findings: Secondary | ICD-10-CM

## 2015-01-19 DIAGNOSIS — E785 Hyperlipidemia, unspecified: Secondary | ICD-10-CM

## 2015-01-19 LAB — POCT GLYCOSYLATED HEMOGLOBIN (HGB A1C): Hemoglobin A1C: 7.3

## 2015-01-19 LAB — GLUCOSE, CAPILLARY: Glucose-Capillary: 138 mg/dL — ABNORMAL HIGH (ref 65–99)

## 2015-01-19 NOTE — Assessment & Plan Note (Signed)
-  Obtain records from recent screening colonoscopy -Obtain screening TSH and vitamin D 25-OH level at next visit

## 2015-01-19 NOTE — Assessment & Plan Note (Addendum)
Assessment: Pt with moderately well-controlled hypertension compliant with four-class (CCB, ACEi, BB & diuretic) anti-hypertensive therapy who presents with blood pressure of 143/80.   Plan:  -BP 143/80 near goal <140/90 -Continue carvedilol 25 mg BID, amlodipine 10 mg daily, and lisinopril-HCTZ 20-25 mg daily -Last CMP on 07/13/14, repeat at next visit (declined today)

## 2015-01-19 NOTE — Progress Notes (Signed)
Patient ID: Sonya Lane, female   DOB: 1951-01-24, 64 y.o.   MRN: [id number]    Subjective:   Patient ID: Sonya Lane female   DOB: April 14, 1951 64 y.o.   MRN: [id number]  HPI: Sonya Lane is a 64 y.o. very pleasant woman with past medical history of hypertension, hyperlipidemia, non-insulin Type 2 DM, and NAFLD who presents for follow-up visit of diabetes.  Her last A1c was 7.5 on 10/13/14. She reports compliance with taking metformin 1000 mg BID and glipizide ER 5 mg daily. She reports just obtaining a glucose meter yesterday. She denies symptomatic hypoglycemia. She has chronic blurry vision, polyuria, polydipsia, polyphagia, and peripheral neuropathy controlled on gabapentin but denies foot injury/ulcer. She tries to follow a healthy diet and recently started going to the gym to exercise. Her weight has been stable since last visit 3 months ago.She is due for annual eye exam.  She reports compliance with taking amlodipine, lisinopril-HCTZ, and carvedilol for hypertension. She has occasional lightheadedness but denies headache, chest pain, or LE edema.   She declines starting statin or alternative therapy for hyperlipidemia due to history of myalgias in the past.    She reports having a colonoscopy in March that was normal.     Past Medical History  Diagnosis Date  . DIABETES MELLITUS, TYPE II 03/17/2006        . HYPERLIPIDEMIA 03/17/2006    Qualifier: Diagnosis of  By: Sharlet Salina MD, [place]    . HYPERTENSION 03/17/2006        . Morbid obesity 01/25/2008    Qualifier: Diagnosis of  By: Phifer MD, Izora Gala     Current Outpatient Prescriptions  Medication Sig Dispense Refill  . amLODipine (NORVASC) 10 MG tablet Take 1 tablet (10 mg total) by mouth daily. 90 tablet 3  . aspirin 81 MG tablet Take 81 mg by mouth daily.      . Blood Glucose Monitoring Suppl (ONETOUCH VERIO IQ SYSTEM) W/DEVICE KIT Before meals and bedtime 1 kit 0  . carvedilol (COREG) 25 MG tablet Take 1  tablet (25 mg total) by mouth 2 (two) times daily with a meal. 180 tablet 3  . fluconazole (DIFLUCAN) 150 MG tablet Take 1 tablet (150 mg total) by mouth once. Take 1 pill for yeast infection 5 tablet 0  . gabapentin (NEURONTIN) 300 MG capsule Take 1 capsule (300 mg total) by mouth 3 (three) times daily. 270 capsule 3  . glipiZIDE (GLUCOTROL XL) 5 MG 24 hr tablet Take 1 tablet (5 mg total) by mouth daily with breakfast. 90 tablet 3  . glucose blood (ONETOUCH VERIO) test strip Check blood sugar before meals and bedtime as instructed 100 each 12  . lisinopril-hydrochlorothiazide (PRINZIDE,ZESTORETIC) 20-25 MG per tablet Take 1 tablet by mouth daily. 90 tablet 3  . metFORMIN (GLUCOPHAGE) 1000 MG tablet Take 1 tablet (1,000 mg total) by mouth 2 (two) times daily with a meal. 180 tablet 3  . Multiple Vitamin (MULTIVITAMIN) tablet Take 1 tablet by mouth daily.      . naproxen (NAPROSYN) 250 MG tablet Take 1 tablet (250 mg total) by mouth 2 (two) times daily with a meal. 60 tablet 1  . ONETOUCH DELICA LANCETS FINE MISC Check blood sugar before meals and bedtime 100 each 12   No current facility-administered medications for this visit.   No family history on file. Social History   Social History  . Marital Status: Single    Spouse Name: N/A  . Number of Children: N/A  .  Years of Education: 81   Social History Main Topics  . Smoking status: Never Smoker   . Smokeless tobacco: Not on file  . Alcohol Use: No  . Drug Use: No  . Sexual Activity: Not on file   Other Topics Concern  . Not on file   Social History Narrative   [name]  September 12, 2009 11:11 AM   Financial assistance approved for 40% discount at [facility] and has [facility] card   [employer]  Oct 02, 2009 2:20 PM   Review of Systems: Review of Systems  Constitutional: Negative for weight loss.  Eyes: Positive for blurred vision (chronic).  Respiratory: Negative for cough, shortness of breath and wheezing.   Cardiovascular:  Negative for chest pain and leg swelling.  Gastrointestinal: Negative for nausea, vomiting, abdominal pain, diarrhea, constipation and blood in stool.  Genitourinary: Negative for dysuria, urgency, frequency and hematuria.  Neurological: Positive for dizziness (lightheaded ) and sensory change (chronic peripheral neuropathy ). Negative for headaches.  Endo/Heme/Allergies: Positive for polydipsia (chronic).     Objective:  Physical Exam: Filed Vitals:   01/19/15 1334  BP: 143/80  Pulse: 83  Temp: 97.5 F (36.4 C)  TempSrc: Oral  Height: 5' 5"  (1.651 m)  Weight: 270 lb 8 oz (122.698 kg)  SpO2: 98%    Physical Exam  Constitutional: She is oriented to person, place, and time. She appears well-developed and well-nourished. No distress.  HENT:  Head: Normocephalic and atraumatic.  Right Ear: External ear normal.  Left Ear: External ear normal.  Nose: Nose normal.  Mouth/Throat: Oropharynx is clear and moist. No oropharyngeal exudate.  Chronic white coating on lateral sides of tongue  Eyes: Conjunctivae and EOM are normal. Pupils are equal, round, and reactive to light. Right eye exhibits no discharge. Left eye exhibits no discharge. No scleral icterus.  Neck: Normal range of motion. Neck supple.  Cardiovascular: Normal rate and normal heart sounds.   Pulmonary/Chest: Effort normal and breath sounds normal. No respiratory distress. She has no wheezes. She has no rales.  Abdominal: Soft. Bowel sounds are normal. She exhibits no distension. There is no tenderness. There is no rebound and no guarding.  Musculoskeletal: Normal range of motion. She exhibits no edema or tenderness.  Neurological: She is alert and oriented to person, place, and time.  Skin: Skin is warm and dry. No rash noted. She is not diaphoretic. No erythema. No pallor.  Psychiatric: She has a normal mood and affect. Her behavior is normal. Judgment and thought content normal.    Assessment & Plan:   Please see  problem list for problem-based assessment and plan

## 2015-01-19 NOTE — Patient Instructions (Signed)
-  Great job on improving your A1c to 7.3 from 7.5, the goal is below 7, keep taking metformin and glipizide  -Pease have your eyes checked when you see Butch Penny next week -Will check your bloodwork next time -So glad you are doing well!  General Instructions:   Please bring your medicines with you each time you come to clinic.  Medicines may include prescription medications, over-the-counter medications, herbal remedies, eye drops, vitamins, or other pills.   Progress Toward Treatment Goals:  Treatment Goal 03/07/2013  Hemoglobin A1C at goal  Blood pressure at goal    Self Care Goals & Plans:  Self Care Goal 01/19/2015  Manage my medications take my medicines as prescribed; bring my medications to every visit; refill my medications on time; follow the sick day instructions if I am sick  Monitor my health check my feet daily  Eat healthy foods drink diet soda or water instead of juice or soda; eat more vegetables; eat fruit for snacks and desserts; eat smaller portions  Be physically active find an activity I enjoy  Meeting treatment goals -    Home Blood Glucose Monitoring 02/04/2013  Check my blood sugar no home glucose monitoring     Care Management & Community Referrals:  Referral 03/07/2013  Referrals made for care management support none needed  Referrals made to community resources weight management

## 2015-01-19 NOTE — Assessment & Plan Note (Signed)
Assessment: Pt with last A1c of 7.5 on 10/13/14 compliant with dual oral hypoglycemic therapy with no recent symptomatic hypoglycemia who presents with CBG of 138 and improved A1c of 7.3.  Plan:  -A1c 7.3 not at goal <7, continue metformin 1000 mg BID and glipizide ER 5 mg daily   -BP 143/80 near goal <140/90,continue carvedilol 25 mg BID, amlodipine 10 mg daily and lisinopril-HCTZ 20-25 mg daily -LDL 127 not at goal <100, pt declines statin therapy due to intolerance and also declines alternatives (zetia, red yeast rice extract, niacin) at this time -Last annual eye exam on 12/08/13 with no retinopathy, pt to return next week for repeat scan  -Last annual foot exam on 10/13/14  -Last annual urine microalbumin test with 119.6 mg of proteinuria, continue lisinopril 20 mg daily  -Continue gabapentin 300 mg TID for chronic peripheral neuropathy -BMI 45.01 not at goal <25, encourage weight loss

## 2015-01-22 NOTE — Progress Notes (Signed)
Medicine attending: Medical history, presenting problems, physical findings, and medications, reviewed with Dr Juluis Mire on the day of the patient visit and I concur with her evaluation and management plan.

## 2015-01-24 ENCOUNTER — Other Ambulatory Visit: Payer: Self-pay | Admitting: Internal Medicine

## 2015-01-24 ENCOUNTER — Telehealth: Payer: Self-pay | Admitting: Dietician

## 2015-01-24 DIAGNOSIS — Z1231 Encounter for screening mammogram for malignant neoplasm of breast: Secondary | ICD-10-CM

## 2015-01-24 NOTE — Telephone Encounter (Signed)
Left message about patient having retinal images done after her 930 group visit for Medical Nutrition Therapy.

## 2015-01-25 ENCOUNTER — Ambulatory Visit (INDEPENDENT_AMBULATORY_CARE_PROVIDER_SITE_OTHER): Payer: Medicare Other | Admitting: Dietician

## 2015-01-25 ENCOUNTER — Ambulatory Visit: Payer: Medicare Other | Admitting: Dietician

## 2015-01-25 VITALS — Wt 272.9 lb

## 2015-01-25 DIAGNOSIS — E1142 Type 2 diabetes mellitus with diabetic polyneuropathy: Secondary | ICD-10-CM

## 2015-01-25 DIAGNOSIS — Z713 Dietary counseling and surveillance: Secondary | ICD-10-CM

## 2015-01-25 DIAGNOSIS — E11329 Type 2 diabetes mellitus with mild nonproliferative diabetic retinopathy without macular edema: Secondary | ICD-10-CM

## 2015-01-25 DIAGNOSIS — E119 Type 2 diabetes mellitus without complications: Secondary | ICD-10-CM

## 2015-01-25 LAB — HM DIABETES EYE EXAM

## 2015-01-25 NOTE — Patient Instructions (Addendum)
I suggest you increase your dairy, whole grains, nuts and decrease your fruit intake Whole grain servings- 1 slice bread, 1/3 cup brown rice or 1/2 cup whole grain past. Barley, quinoa, bulgur, oatmeal)     Good job exercising.   Your blood sugars might be high in the morning because of insulin resistance.   Exercising should help the insulin resistance.   See you in September.

## 2015-01-25 NOTE — Progress Notes (Signed)
Retinal images done and transmitted.

## 2015-01-25 NOTE — Progress Notes (Signed)
Great--thanks Butch Penny!  Dr. Naaman Plummer

## 2015-01-25 NOTE — Progress Notes (Signed)
Medical Nutrition Therapy:  Appt start time: 0940 end time:  1040. 9th visit Assessment:  Primary concerns today: healthy meal plan, glycemic control and weight loss  Patient attended Medical Nutrition Therapy visit today. Eduction included how to incorporate more whole grains, nuts and seeds and dairy for excess fruit consumption. Education was also included the topic of insulin resistance and how to manage it. She was successful at insorption exercise and is going to [facility] 5 days a week for 60 minutes for the past 3 weeks. Feels dizzy on and off especially after exercise and at night.   Learning Readiness:ready and making changes weight today- 272.9#, increased 3.3# in the past 2 months, has hit a plateau    SELF Monitoring- got a meter and started checking daily 3 days ago, download range [phone] for past 3 days with average of 165, pattern shows high fastings and in target later in day. Her lowest was  88 yesterday after exercise and reported feeling dizzy at this time., CBG here today : 272 after large coffee with milk and creamer Medicine: decreased to glipizide extended release 5 mg/day.  DIETARY INTAKE: eating more meat and attributes her with gain to this.  24-hr recall suggests intake of 1400 kcal:  B ( AM)- large coffee with creamer and 4 oz skim milk, 1/2 banana  L ( PM)- whole bag of frozen spinach with onion and 1 teaspoon oil and 2 slices bacon  Snk ( PM)- 5-6 pieces of fruit  D ( PM)- [place] meatloaf on bread  Snk ( PM)-  Fruit Beverages- water and coffee Typical day? Yes.    Usual physical activity: recumbent bike 60 minutes 5 days a week   Progress Towards Goal(s):  Some progress.   Nutritional Diagnosis:  NB-1.1 Food and nutrition-related knowledge deficit As related to lack of prior diabetes training continues to improve and need reinforcement. As evidenced by her questions and interest.    Intervention:  Nutrition counseling and education about maintaining  activity, weight loss and well being as well as improving blood sugars and preventing hypoglycemia. Handouts given during visit include: AVS Barriers to learning/adherence to lifestyle change: Leg/foot pain, dizzy feelings  Demonstrated degree of understanding via:  Teach Back   Monitoring/Evaluation:  Dietary intake, exercise, meter and body weight in 4 week(s) Signed up for group.

## 2015-01-31 ENCOUNTER — Ambulatory Visit (HOSPITAL_COMMUNITY)
Admission: RE | Admit: 2015-01-31 | Discharge: 2015-01-31 | Disposition: A | Discharge status: Home / Self Care | Payer: Medicare Other | Source: Ambulatory Visit | Attending: Internal Medicine | Admitting: Internal Medicine

## 2015-01-31 DIAGNOSIS — Z1231 Encounter for screening mammogram for malignant neoplasm of breast: Secondary | ICD-10-CM

## 2015-02-06 ENCOUNTER — Encounter: Payer: Self-pay | Admitting: Dietician

## 2015-02-14 NOTE — Addendum Note (Signed)
Addended by: Truddie Crumble on: 02/14/2015 03:03 PM   Modules accepted: Orders

## 2015-02-22 ENCOUNTER — Ambulatory Visit (INDEPENDENT_AMBULATORY_CARE_PROVIDER_SITE_OTHER): Payer: Medicare Other | Admitting: Dietician

## 2015-02-22 ENCOUNTER — Encounter: Payer: Self-pay | Admitting: Internal Medicine

## 2015-02-22 VITALS — Wt 272.8 lb

## 2015-02-22 DIAGNOSIS — Z713 Dietary counseling and surveillance: Secondary | ICD-10-CM | POA: Diagnosis not present

## 2015-02-22 DIAGNOSIS — E113299 Type 2 diabetes mellitus with mild nonproliferative diabetic retinopathy without macular edema, unspecified eye: Secondary | ICD-10-CM | POA: Insufficient documentation

## 2015-02-22 DIAGNOSIS — E1142 Type 2 diabetes mellitus with diabetic polyneuropathy: Secondary | ICD-10-CM

## 2015-02-22 DIAGNOSIS — E119 Type 2 diabetes mellitus without complications: Secondary | ICD-10-CM

## 2015-02-22 NOTE — Patient Instructions (Addendum)
You need 1,950 Calories/day to lose 1 lb per week.   You are doing great exercising, and trying to cut back on higher calorie and carbohydrate foods   I suggest a detailed food record including all the food and drinks you eat at night and then take a look at it to try to troubleshoot.- One time a week- measure on mondays.     Are you hungry or tired or is it the glipizide kicking in at night at night to maybe both.  You can do experiments- you can try a trial of taking glipizide in the evening if that doesn't help the hunger and blood sugars,   See you in January and keep exercising, checking blood sugar and eating healthy over the holidays

## 2015-02-22 NOTE — Progress Notes (Signed)
Medical Nutrition Therapy:  Appt start time: 0925nd time:  0539  visit # 10 Assessment:  Primary concerns today: healthy meal plan, glycemic control and weight loss  Patient attended Medical Nutrition Therapy visit today. She is frustrated by her lack of progress with her weight and blood sugars. We discussed possible reasons and solutions for her evening hunger and high fasting blood sugars. Education about healthy fats and how to use these in moderation to lower carb intake. ce and how to manage it. She continues to  go to [facility] 5 days a week for 60 minutes.   She watched Dr. Elsie Lincoln " The single most important thing you can do for your health" which encourages exercise at any weight Learning Readiness:ready in some areas and making changes weight today- 272.8#, little change from last month  SELF MONITORING-almost checking daily, download range 88-233 for past 3 days with average of 180(higher), pattern still shows high fastings and in target later in day.  MEDICINE: decreased to glipizide extended release 5 mg/day.  DIETARY INTAKE: eating less meat, fruit and bread .  24-hr recall suggests intake of 1400 kcal ( same as last month) :  B ( AM)- large coffee with creamer and 4 oz skim milk,  10-11 am Snack after exercise- whole banana, tea  L ( 12-2 PM)- greens cooked in sausage, pintos and rice  Snk ( PM)- 1 plum & 1 orange  D ( 7 PM)- greens cooked in sausage  Snk ( PM)- peanut butter sandwich, baked potato,  2 servings peanuts( ~ 800 calories)  Beverages- water, coffee, diet snapple tea and coffee Typical day? Yes.   Estimated calorie needs using 3 times a week exercise was 1950 calories/day for weight loss of 1 # per week.  Usual physical activity: recumbent bike 60 minutes 5 days a week   Progress Towards Goal(s):  Some progress.   Nutritional Diagnosis:  NB-1.1 Food and nutrition-related knowledge deficit As related to lack of prior diabetes training continues to  improve and need reinforcement. As evidenced by her questions and interest.    Intervention:  Nutrition counseling and education about maintaining activity, keepijg food records one time a week for weight loss, trying to trouble shoot her for lowering her high blood  sugars. Handouts given during visit include: AVS Barriers to learning/adherence to lifestyle change: Leg/foot pain, dizzy feelings  Demonstrated degree of understanding via:  Teach Back   Monitoring/Evaluation:  Dietary intake, exercise, meter and body weight in 4 month(s) per patient request

## 2015-04-13 ENCOUNTER — Ambulatory Visit (INDEPENDENT_AMBULATORY_CARE_PROVIDER_SITE_OTHER): Payer: Medicare Other | Admitting: Internal Medicine

## 2015-04-13 ENCOUNTER — Encounter: Payer: Self-pay | Admitting: Internal Medicine

## 2015-04-13 VITALS — BP 155/75 | HR 77 | Temp 98.1°F | Wt 274.2 lb

## 2015-04-13 DIAGNOSIS — Z23 Encounter for immunization: Secondary | ICD-10-CM

## 2015-04-13 DIAGNOSIS — E1142 Type 2 diabetes mellitus with diabetic polyneuropathy: Secondary | ICD-10-CM | POA: Diagnosis not present

## 2015-04-13 DIAGNOSIS — Z794 Long term (current) use of insulin: Secondary | ICD-10-CM | POA: Diagnosis not present

## 2015-04-13 DIAGNOSIS — Z0000 Encounter for general adult medical examination without abnormal findings: Secondary | ICD-10-CM | POA: Diagnosis not present

## 2015-04-13 DIAGNOSIS — I10 Essential (primary) hypertension: Secondary | ICD-10-CM

## 2015-04-13 LAB — POCT GLYCOSYLATED HEMOGLOBIN (HGB A1C): HEMOGLOBIN A1C: 6.8

## 2015-04-13 LAB — GLUCOSE, CAPILLARY: Glucose-Capillary: 93 mg/dL (ref 65–99)

## 2015-04-13 NOTE — Progress Notes (Signed)
Patient ID: Sonya Lane, female   DOB: [birth year], 64 y.o.   MRN: [id number]    Subjective:   Patient ID: Sonya Lane female   DOB: 1950/07/07 64 y.o.   MRN: [id number]  HPI: [name] Winberg is a 64 y.o. very pleasant woman with past medical history of hypertension, hyperlipidemia, non-insulin Type 2 DM, and NAFLD who presents for follow-up visit of diabetes.  Her last A1c was 7.3 on 01/19/15. She reports compliance with taking metformin 1000 mg BID and glipizide ER 5 mg daily. She does not check her blood sugars at home. She denies symptomatic hypoglycemia. She has chronic blurry vision, polyuria, polydipsia, polyphagia, and peripheral neuropathy controlled on gabapentin but denies foot injury/ulcer. She follows a healthy diet and goes to the gym to exercise. 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   She reports compliance with taking amlodipine, lisinopril-HCTZ, and carvedilol for hypertension. She denies lightheadedness, headache, chest pain, or LE edema.   She declines starting statin or alternative therapy for hyperlipidemia.    She would like pneumonia and flu vaccines today. She denies zoster or DEXA scan.      Past Medical History  Diagnosis Date  . DIABETES MELLITUS, TYPE II 03/17/2006        . HYPERLIPIDEMIA 03/17/2006    Qualifier: Diagnosis of  By: Sharlet Salina MD, [place]    . HYPERTENSION 03/17/2006        . Morbid obesity ([place]) 01/25/2008    Qualifier: Diagnosis of  By: Phifer MD, Izora Gala     Current Outpatient Prescriptions  Medication Sig Dispense Refill  . amLODipine (NORVASC) 10 MG tablet Take 1 tablet (10 mg total) by mouth daily. 90 tablet 3  . aspirin 81 MG tablet Take 81 mg by mouth daily.      . Blood Glucose Monitoring Suppl (ONETOUCH VERIO IQ SYSTEM) W/DEVICE KIT Before meals and bedtime 1 kit 0  . carvedilol (COREG) 25 MG tablet Take 1 tablet (25 mg total) by mouth 2 (two) times daily with a  meal. 180 tablet 3  . fluconazole (DIFLUCAN) 150 MG tablet Take 1 tablet (150 mg total) by mouth once. Take 1 pill for yeast infection 5 tablet 0  . gabapentin (NEURONTIN) 300 MG capsule Take 1 capsule (300 mg total) by mouth 3 (three) times daily. 270 capsule 3  . glipiZIDE (GLUCOTROL XL) 5 MG 24 hr tablet Take 1 tablet (5 mg total) by mouth daily with breakfast. 90 tablet 3  . glucose blood (ONETOUCH VERIO) test strip Check blood sugar before meals and bedtime as instructed 100 each 12  . lisinopril-hydrochlorothiazide (PRINZIDE,ZESTORETIC) 20-25 MG per tablet Take 1 tablet by mouth daily. 90 tablet 3  . metFORMIN (GLUCOPHAGE) 1000 MG tablet Take 1 tablet (1,000 mg total) by mouth 2 (two) times daily with a meal. 180 tablet 3  . Multiple Vitamin (MULTIVITAMIN) tablet Take 1 tablet by mouth daily.      . naproxen (NAPROSYN) 250 MG tablet Take 1 tablet (250 mg total) by mouth 2 (two) times daily with a meal. 60 tablet 1  . ONETOUCH DELICA LANCETS FINE MISC Check blood sugar before meals and bedtime 100 each 12   No current facility-administered medications for this visit.   No family history on file. Social History   Social History  . Marital Status: Single    Spouse Name: N/A  . Number of Children: N/A  . Years of Education: 74  Social History Main Topics  . Smoking status: Never Smoker   . Smokeless tobacco: None  . Alcohol Use: No  . Drug Use: No  . Sexual Activity: Not Asked   Other Topics Concern  . None   Social History Narrative   Sonya Lane  September 12, 2009 11:11 AM   Financial assistance approved for 40% discount at [facility] and has [facility] card   [employer]  Oct 02, 2009 2:20 PM   Review of Systems: Review of Systems  Constitutional: Negative for weight loss.  Eyes: Positive for blurred vision (chronic).  Respiratory: Negative for cough, shortness of breath and wheezing.   Cardiovascular: Negative for chest pain and leg swelling.  Gastrointestinal: Positive for  heartburn (possibly) and constipation (chronic). Negative for nausea, vomiting, abdominal pain and diarrhea.  Genitourinary: Negative for dysuria, urgency and frequency.  Musculoskeletal: Negative for myalgias.  Neurological: Positive for sensory change (chronic peripheral neuropathy). Negative for dizziness and headaches.  Endo/Heme/Allergies: Positive for polydipsia (chronic).     Objective:  Physical Exam: Filed Vitals:   04/13/15 1338  BP: 155/75  Pulse: 77  Temp: 98.1 F (36.7 C)  TempSrc: Oral  Weight: 274 lb 3.2 oz (124.376 kg)  SpO2: 99%    Physical Exam  Constitutional: She is oriented to person, place, and time. She appears well-developed and well-nourished. No distress.  HENT:  Head: Normocephalic and atraumatic.  Right Ear: External ear normal.  Left Ear: External ear normal.  Nose: Nose normal.  Mouth/Throat: Oropharynx is clear and moist. No oropharyngeal exudate.  Eyes: EOM are normal.  Neck: Normal range of motion. Neck supple.  Cardiovascular: Normal rate, regular rhythm and normal heart sounds.   Pulmonary/Chest: Effort normal and breath sounds normal. No respiratory distress. She has no wheezes. She has no rales.  Abdominal: Soft. Bowel sounds are normal. She exhibits no distension. There is no tenderness. There is no rebound and no guarding.  Musculoskeletal: Normal range of motion. She exhibits no edema or tenderness.  Neurological: She is alert and oriented to person, place, and time.  Skin: Skin is warm and dry. No rash noted. She is not diaphoretic. No erythema. No pallor.  Psychiatric: She has a normal mood and affect. Her behavior is normal. Judgment and thought content normal.    Assessment & Plan:   Please see problem list for problem-based assessment and plan

## 2015-04-13 NOTE — Assessment & Plan Note (Signed)
Assessment: Pt with moderately well-controlled hypertension compliant with four-class (CCB, ACEi, BB & diuretic) anti-hypertensive therapy who presents with blood pressure of 155/75.   Plan:  -BP 155/75 not at goal <140/90 however normally near goal  -Continue carvedilol 25 mg BID, amlodipine 10 mg daily, and lisinopril-HCTZ 20-25 mg daily -Last CMP on 07/13/14, repeat at next visit (declined today)

## 2015-04-13 NOTE — Assessment & Plan Note (Addendum)
Assessment: Pt with last A1c of 7.3 on 01/19/15 compliant with dual oral hypoglycemic therapy with no recent symptomatic hypoglycemia who presents with CBG of 93 and improved A1c of 6.8.  Plan:  -A1c 6.8 at goal <7, continue metformin 1000 mg BID and glipizide ER 5 mg daily   -BP 155/75 not at goal <140/90 however normally near goal, continue carvedilol 25 mg BID, amlodipine 10 mg daily and lisinopril-HCTZ 20-25 mg daily -LDL 127 not at goal <100, pt declines statin therapy due to intolerance and also declines alternatives (zetia, red yeast rice extract, niacin) at this time -Last annual eye exam on 01/25/15 with b/l NPDR, refer to opthalmology   -Last annual foot exam on 10/13/14  -Last annual urine microalbumin test with 119.6 mg of proteinuria, continue lisinopril 20 mg daily  -Continue gabapentin 300 mg TID for chronic peripheral neuropathy -Continue aspirin 81 mg daily for primary CVD prevention  -BMI 45.63 not at goal <25, encourage weight loss

## 2015-04-13 NOTE — Assessment & Plan Note (Addendum)
-  Pt received annual influenza and PCV13 vaccinations today on 04/13/15 -Obtain records from recent screening colonoscopy -Obtain screening TSH and vitamin D 25-OH level at next visit -Pt declined zoster vaccination and DEXA scan

## 2015-04-13 NOTE — Patient Instructions (Signed)
-  Great job on improving your A1c to 6.8 which is at goal of less than 7! Keep taking the metformin and glipizide.  -Will refer you to the eye doctor  -Will give you flu and pneumonia shot today -Will check your labs next time  -Try senokot-S for constipation. Try zantac, prilosec, or pantoprazole for acid reflux -Please come back in 4-6 months for your diabetes -Have a great holiday!   General Instructions:   Please bring your medicines with you each time you come to clinic.  Medicines may include prescription medications, over-the-counter medications, herbal remedies, eye drops, vitamins, or other pills.   Progress Toward Treatment Goals:  Treatment Goal 03/07/2013  Hemoglobin A1C at goal  Blood pressure at goal    Self Care Goals & Plans:  Self Care Goal 04/13/2015  Manage my medications take my medicines as prescribed; bring my medications to every visit  Monitor my health keep track of my blood pressure; bring my glucose meter and log to each visit; check my feet daily  Eat healthy foods eat foods that are low in salt; eat baked foods instead of fried foods  Be physically active find an activity I enjoy; take a walk every day  Meeting treatment goals -    Home Blood Glucose Monitoring 02/04/2013  Check my blood sugar no home glucose monitoring     Care Management & Community Referrals:  Referral 03/07/2013  Referrals made for care management support none needed  Referrals made to community resources weight management

## 2015-04-17 NOTE — Progress Notes (Signed)
Internal Medicine Clinic Attending  Case discussed with Dr. Rabbani soon after the resident saw the patient.  We reviewed the resident's history and exam and pertinent patient test results.  I agree with the assessment, diagnosis, and plan of care documented in the resident's note.  

## 2015-05-18 ENCOUNTER — Other Ambulatory Visit: Payer: Self-pay | Admitting: Internal Medicine

## 2015-05-18 DIAGNOSIS — N898 Other specified noninflammatory disorders of vagina: Secondary | ICD-10-CM

## 2015-05-18 DIAGNOSIS — B372 Candidiasis of skin and nail: Secondary | ICD-10-CM

## 2015-05-18 MED ORDER — NYSTATIN 100000 UNIT/GM EX CREA: 1.0000 "application " | TOPICAL_CREAM | Freq: Four times a day (QID) | CUTANEOUS | Status: DC

## 2015-05-18 MED ORDER — FLUCONAZOLE 150 MG PO TABS
150.0000 mg | ORAL_TABLET | Freq: Once | ORAL | Status: DC
Start: 2015-05-18 — End: 2015-12-21

## 2015-05-26 ENCOUNTER — Other Ambulatory Visit: Payer: Self-pay | Admitting: Internal Medicine

## 2015-06-26 ENCOUNTER — Telehealth: Payer: Self-pay | Admitting: Dietician

## 2015-06-26 NOTE — Telephone Encounter (Signed)
Called patient about rejoining group MNT for diabetes and weight management. She is still exercising 5 days a week and has found some support there. She exresses frustration that she doesn't;t feel she has lost additional weight. She is not ready to rejoin the group. Reeducated about using food records/journals. She requested 1200 calorie diet and food record template. These were mailed to her. Encourage her to follow-up for MNT and bring food records if not group then 1:1.

## 2015-07-17 ENCOUNTER — Telehealth: Payer: Self-pay | Admitting: Dietician

## 2015-07-17 NOTE — Telephone Encounter (Signed)
agreed to come to group visit MNT on Thursday

## 2015-07-18 ENCOUNTER — Other Ambulatory Visit: Payer: Self-pay | Admitting: Internal Medicine

## 2015-07-18 DIAGNOSIS — E1142 Type 2 diabetes mellitus with diabetic polyneuropathy: Secondary | ICD-10-CM

## 2015-07-18 NOTE — Telephone Encounter (Signed)
Thanks just put in the referrals for 2017.  Dr. Naaman Plummer

## 2015-07-19 ENCOUNTER — Ambulatory Visit (INDEPENDENT_AMBULATORY_CARE_PROVIDER_SITE_OTHER): Payer: Medicare Other | Admitting: Dietician

## 2015-07-19 VITALS — Wt 273.3 lb

## 2015-07-19 DIAGNOSIS — Z713 Dietary counseling and surveillance: Secondary | ICD-10-CM | POA: Diagnosis not present

## 2015-07-19 DIAGNOSIS — E1142 Type 2 diabetes mellitus with diabetic polyneuropathy: Secondary | ICD-10-CM

## 2015-07-19 DIAGNOSIS — E119 Type 2 diabetes mellitus without complications: Secondary | ICD-10-CM | POA: Diagnosis not present

## 2015-07-19 DIAGNOSIS — Z6841 Body Mass Index (BMI) 40.0 and over, adult: Secondary | ICD-10-CM

## 2015-07-19 NOTE — Progress Notes (Signed)
Medical Nutrition Therapy:  Appt start time: 1030 End time:  2761  visit # 1 this year, last visit was 02/2015 Assessment:  Primary concerns today: healthy meal plan, glycemic control and weight loss  Patient attended Group Medical Nutrition Therapy visit today.  Education included importance of calorie control in weight loss, improtacne of decreasing sodium intake, importance of support and sharing of recipes and feelings about weight loss and diabetes.  She continues at the [employer] 5 days a week for 60 minutes and says she can tell a difference in her fitness level.    weight today- 273.3#, little change, BMI- 45.5  SELF MONITORING-knew her A1C, but not weight.  MEDICINE: decreased to glipizide extended release 5 mg/day.  DIETARY INTAKE: not tracking but thinking about starting   Progress Towards Goal(s):  Some progress.   Nutritional Diagnosis:  NB-1.1 Food and nutrition-related knowledge deficit As related to lack of prior diabetes training continues to improved and need reinforcement. As evidenced by her questions and interest.    Intervention:  Group Nutrition counseling and education about maintaining activity, keepijg food records one time a week for weight loss, trying to trouble shoot her for lowering her high blood  sugars. Handouts given during visit include: habits of successful weight loss, weight food and activity trackers Barriers to learning/adherence to lifestyle change: competing values, lack of support  Demonstrated degree of understanding via:  Teach Back   Monitoring/Evaluation:  Dietary intake, exercise, meter and body weight in 4 week(s) per patient request

## 2015-07-19 NOTE — Patient Instructions (Addendum)
Good to see you today!!!  Thank you for your participation!  I do not know about the April 7th appointment with me, you can cancel that if you don't think you need it.   Butch Penny

## 2015-08-03 ENCOUNTER — Other Ambulatory Visit: Payer: Self-pay | Admitting: Internal Medicine

## 2015-08-16 ENCOUNTER — Ambulatory Visit: Payer: Medicare Other | Admitting: Dietician

## 2015-09-07 ENCOUNTER — Encounter: Payer: Medicare Other | Admitting: Dietician

## 2015-09-07 ENCOUNTER — Encounter: Payer: Self-pay | Admitting: Internal Medicine

## 2015-09-07 ENCOUNTER — Ambulatory Visit (INDEPENDENT_AMBULATORY_CARE_PROVIDER_SITE_OTHER): Payer: Medicare Other | Admitting: Internal Medicine

## 2015-09-07 VITALS — BP 138/74 | HR 76 | Temp 97.7°F | Wt 272.5 lb

## 2015-09-07 DIAGNOSIS — E1165 Type 2 diabetes mellitus with hyperglycemia: Secondary | ICD-10-CM

## 2015-09-07 DIAGNOSIS — E1142 Type 2 diabetes mellitus with diabetic polyneuropathy: Secondary | ICD-10-CM | POA: Diagnosis not present

## 2015-09-07 DIAGNOSIS — Z7982 Long term (current) use of aspirin: Secondary | ICD-10-CM

## 2015-09-07 DIAGNOSIS — Z7984 Long term (current) use of oral hypoglycemic drugs: Secondary | ICD-10-CM

## 2015-09-07 DIAGNOSIS — E113299 Type 2 diabetes mellitus with mild nonproliferative diabetic retinopathy without macular edema, unspecified eye: Secondary | ICD-10-CM | POA: Diagnosis not present

## 2015-09-07 DIAGNOSIS — Z0000 Encounter for general adult medical examination without abnormal findings: Secondary | ICD-10-CM

## 2015-09-07 DIAGNOSIS — Z6841 Body Mass Index (BMI) 40.0 and over, adult: Secondary | ICD-10-CM

## 2015-09-07 DIAGNOSIS — I10 Essential (primary) hypertension: Secondary | ICD-10-CM | POA: Diagnosis not present

## 2015-09-07 LAB — GLUCOSE, CAPILLARY: Glucose-Capillary: 144 mg/dL — ABNORMAL HIGH (ref 65–99)

## 2015-09-07 LAB — POCT GLYCOSYLATED HEMOGLOBIN (HGB A1C): Hemoglobin A1C: 7.6

## 2015-09-07 MED ORDER — GLIPIZIDE ER 5 MG PO TB24: 10.0000 mg | ORAL_TABLET | Freq: Every day | ORAL | Status: DC

## 2015-09-07 NOTE — Progress Notes (Signed)
Patient ID: Sonya Lane, female   DOB: Oct 06, 1950, 65 y.o.   MRN: [id number]    Subjective:   Patient ID: Sonya Lane female   DOB: 30-Jan-1951 65 y.o.   MRN: [id number]  HPI: [name] is a 65 y.o.   very pleasant woman with past medical history of hypertension, hyperlipidemia, non-insulin Type 2 DM, and NAFLD who presents for follow-up visit of diabetes.  Her last A1c was 6.8 on 04/13/15. She reports compliance with taking metformin 1000 mg BID and glipizide ER 5 mg daily. She does not check her blood sugars at home. She denies symptomatic hypoglycemia. She has chronic blurry vision, polyuria, polydipsia, polyphagia, and peripheral neuropathy controlled on gabapentin but denies foot injury/ulcer. She follows a healthy diet and goes to the gym to exercise. She has lost 2 lb's since last visit 5 months ago.Her last eye exam revealed bilateral NPDR but has not yet seen opthalmology.    She reports compliance with taking amlodipine, lisinopril-HCTZ, and carvedilol for hypertension. She has occasional lightheadedness and headache but denies chest pain or LE edema.   She declines starting statin or alternative therapy for hyperlipidemia.     Past Medical History  Diagnosis Date  . DIABETES MELLITUS, TYPE II 03/17/2006        . HYPERLIPIDEMIA 03/17/2006    Qualifier: Diagnosis of  By: Sharlet Salina MD, [place]    . HYPERTENSION 03/17/2006        . Morbid obesity ([place]) 01/25/2008    Qualifier: Diagnosis of  By: Phifer MD, Izora Gala     Current Outpatient Prescriptions  Medication Sig Dispense Refill  . amLODipine (NORVASC) 10 MG tablet Take 1 tablet by mouth  daily 90 tablet 3  . aspirin 81 MG tablet Take 81 mg by mouth daily.      . Blood Glucose Monitoring Suppl (ONETOUCH VERIO IQ SYSTEM) W/DEVICE KIT Before meals and bedtime 1 kit 0  . carvedilol (COREG) 25 MG tablet Take 1 tablet by mouth two  times daily with a meal 180 tablet 3  . fluconazole (DIFLUCAN) 150 MG tablet Take 1  tablet (150 mg total) by mouth once. Take 1 pill for yeast infection 5 tablet 0  . gabapentin (NEURONTIN) 300 MG capsule Take 1 capsule by mouth 3  times daily 270 capsule 3  . GLIPIZIDE XL 5 MG 24 hr tablet Take 1 tablet by mouth  daily with breakfast 90 tablet 3  . glucose blood (ONETOUCH VERIO) test strip Check blood sugar before meals and bedtime as instructed 100 each 12  . lisinopril-hydrochlorothiazide (PRINZIDE,ZESTORETIC) 20-25 MG tablet Take 1 tablet by mouth  daily 90 tablet 3  . metFORMIN (GLUCOPHAGE) 1000 MG tablet Take 1 tablet by mouth two  times daily with a meal 180 tablet 3  . Multiple Vitamin (MULTIVITAMIN) tablet Take 1 tablet by mouth daily.      Marland Kitchen nystatin cream (MYCOSTATIN) Apply to affected area(s) 4 times daily spaced 4 to 6  hours apart for 10 days 30 g 1  . ONETOUCH DELICA LANCETS FINE MISC Check blood sugar before meals and bedtime 100 each 12   No current facility-administered medications for this visit.   No family history on file. Social History   Social History  . Marital Status: Single    Spouse Name: N/A  . Number of Children: N/A  . Years of Education: 1   Social History Main Topics  . Smoking status: Never Smoker   . Smokeless tobacco: Not on file  .  Alcohol Use: No  . Drug Use: No  . Sexual Activity: Not on file   Other Topics Concern  . Not on file   Social History Narrative   [name]  September 12, 2009 11:11 AM   Financial assistance approved for 40% discount at [facility] and has [facility] card   [employer]  Oct 02, 2009 2:20 PM   Review of Systems: Review of Systems  Constitutional: Positive for weight loss. Negative for fever and chills.  Eyes: Positive for blurred vision (chronic).  Respiratory: Negative for cough, shortness of breath and wheezing.   Cardiovascular: Negative for chest pain and leg swelling.  Gastrointestinal: Positive for abdominal pain (lower ) and diarrhea. Negative for nausea, vomiting and constipation.    Genitourinary: Negative for dysuria, urgency and frequency.  Musculoskeletal: Negative for myalgias.  Neurological: Positive for dizziness, sensory change (chronic) and headaches.  Endo/Heme/Allergies: Positive for polydipsia (chronic).    Objective:  Physical Exam: Filed Vitals:   09/07/15 1419  BP: 138/74  Pulse: 76  Temp: 97.7 F (36.5 C)  TempSrc: Oral  Weight: 272 lb 8 oz (123.605 kg)  SpO2: 99%    Physical Exam  Constitutional: She is oriented to person, place, and time. She appears well-developed and well-nourished. No distress.  HENT:  Head: Normocephalic and atraumatic.  Right Ear: External ear normal.  Left Ear: External ear normal.  Nose: Nose normal.  Mouth/Throat: Oropharynx is clear and moist. No oropharyngeal exudate.  Eyes: Conjunctivae and EOM are normal. Pupils are equal, round, and reactive to light. Right eye exhibits no discharge. Left eye exhibits no discharge. No scleral icterus.  Neck: Normal range of motion. Neck supple.  Cardiovascular: Normal rate and regular rhythm.   Distant heart sounds  Pulmonary/Chest: Effort normal and breath sounds normal. No respiratory distress. She has no wheezes. She has no rales.  Abdominal: Soft. Bowel sounds are normal. She exhibits no distension. There is no tenderness. There is no rebound and no guarding.  Musculoskeletal: Normal range of motion. She exhibits no edema or tenderness.  Neurological: She is alert and oriented to person, place, and time.  Skin: Skin is warm and dry. No rash noted. She is not diaphoretic. No erythema. No pallor.  Psychiatric: She has a normal mood and affect. Her behavior is normal. Judgment and thought content normal.    Assessment & Plan:   Please see problem list for problem-based assessment and plan

## 2015-09-07 NOTE — Patient Instructions (Signed)
-  Your A1c is 7.6 which is up from 6.8, increase your glipizide from 5 mg  to 10 mg daily    -Will check your bloodwork and call you with the results  -Will have you see the eye doctor and get your colonoscopy records -Glad you are doing well, please come back in 3 months  General Instructions:   Please bring your medicines with you each time you come to clinic.  Medicines may include prescription medications, over-the-counter medications, herbal remedies, eye drops, vitamins, or other pills.   Progress Toward Treatment Goals:  Treatment Goal 03/07/2013  Hemoglobin A1C at goal  Blood pressure at goal    Self Care Goals & Plans:  Self Care Goal 04/13/2015  Manage my medications take my medicines as prescribed; bring my medications to every visit  Monitor my health keep track of my blood pressure; bring my glucose meter and log to each visit; check my feet daily  Eat healthy foods eat foods that are low in salt; eat baked foods instead of fried foods  Be physically active find an activity I enjoy; take a walk every day    Home Blood Glucose Monitoring 02/04/2013  Check my blood sugar no home glucose monitoring     Care Management & Community Referrals:  Referral 03/07/2013  Referrals made for care management support none needed  Referrals made to community resources weight management

## 2015-09-07 NOTE — Assessment & Plan Note (Signed)
Assessment: Pt with moderately well-controlled hypertension compliant with four-class (CCB, ACEi, BB & diuretic) anti-hypertensive therapy who presents with blood pressure of 138/74.   Plan:  -BP 138/74 at goal <140/90  -Continue carvedilol 25 mg BID, amlodipine 10 mg daily, and lisinopril-HCTZ 20-25 mg daily -Obtain CMP

## 2015-09-07 NOTE — Assessment & Plan Note (Signed)
-  Obtain records from screening colonoscopy -Obtain screening TSH and vitamin D 25-OH level at next visit

## 2015-09-07 NOTE — Assessment & Plan Note (Addendum)
Assessment: Pt with last A1c of 6.8 on 04/13/15 compliant with dual oral hypoglycemic therapy with no recent symptomatic hypoglycemia who presents with CBG of 144 and worsened A1c of 7.6.  Plan:  -A1c 7.6 not at goal <7,  increase glipizide ER 5 mg daily to 10 mg daily and continue metformin 1000 mg BID  -BP 138/74 at goal <140/90, continue carvedilol 25 mg BID, amlodipine 10 mg daily and lisinopril-HCTZ 20-25 mg daily -Obtain lipid panel, last LDL 127 not at goal <100, pt declines statin therapy due to intolerance and also declines alternatives (zetia, red yeast rice extract, niacin) at this time -Last annual eye exam on 01/25/15 with b/l NPDR, pt to see opthalmology  -Last annual foot exam on 10/13/14, repeat at next visit   -Last annual urine microalbumin test on 10/13/14 with 119.6 mg of proteinuria, continue lisinopril 20 mg daily -Continue gabapentin 300 mg TID for chronic peripheral neuropathy -Continue aspirin 81 mg daily for primary CVD prevention  -BMI 45.48 not at goal <25, encourage weight loss

## 2015-09-08 LAB — CMP14 + ANION GAP
ALBUMIN: 4.3 g/dL (ref 3.6–4.8)
ALK PHOS: 59 IU/L (ref 39–117)
ALT: 30 IU/L (ref 0–32)
ANION GAP: 20 mmol/L — AB (ref 10.0–18.0)
AST: 22 IU/L (ref 0–40)
Albumin/Globulin Ratio: 1.7 (ref 1.2–2.2)
BUN/Creatinine Ratio: 11 — ABNORMAL LOW (ref 12–28)
BUN: 10 mg/dL (ref 8–27)
Bilirubin Total: 0.3 mg/dL (ref 0.0–1.2)
CO2: 23 mmol/L (ref 18–29)
CREATININE: 0.92 mg/dL (ref 0.57–1.00)
Calcium: 10.3 mg/dL (ref 8.7–10.3)
Chloride: 97 mmol/L (ref 96–106)
GFR, EST AFRICAN AMERICAN: 75 mL/min/{1.73_m2} (ref >59)
GFR, EST NON AFRICAN AMERICAN: 65 mL/min/{1.73_m2} (ref >59)
Globulin, Total: 2.5 g/dL (ref 1.5–4.5)
Glucose: 143 mg/dL — ABNORMAL HIGH (ref 65–99)
Potassium: 4.4 mmol/L (ref 3.5–5.2)
Sodium: 140 mmol/L (ref 134–144)
Total Protein: 6.8 g/dL (ref 6.0–8.5)

## 2015-09-08 LAB — LIPID PANEL
CHOL/HDL RATIO: 4.4 ratio (ref 0.0–4.4)
Cholesterol, Total: 237 mg/dL — ABNORMAL HIGH (ref 100–199)
HDL: 54 mg/dL (ref >39)
LDL CALC: 146 mg/dL — AB (ref 0–99)
Triglycerides: 184 mg/dL — ABNORMAL HIGH (ref 0–149)
VLDL CHOLESTEROL CAL: 37 mg/dL (ref 5–40)

## 2015-09-26 NOTE — Progress Notes (Signed)
Internal Medicine Clinic Attending  Case discussed with Dr. Rabbani soon after the resident saw the patient.  We reviewed the resident's history and exam and pertinent patient test results.  I agree with the assessment, diagnosis, and plan of care documented in the resident's note.  

## 2015-10-12 ENCOUNTER — Other Ambulatory Visit: Payer: Self-pay | Admitting: Internal Medicine

## 2015-10-12 DIAGNOSIS — E785 Hyperlipidemia, unspecified: Secondary | ICD-10-CM

## 2015-10-12 MED ORDER — EZETIMIBE 10 MG PO TABS: 10.0000 mg | ORAL_TABLET | Freq: Every day | ORAL | Status: DC

## 2015-11-14 ENCOUNTER — Other Ambulatory Visit: Payer: Self-pay | Admitting: Internal Medicine

## 2015-11-14 DIAGNOSIS — E1142 Type 2 diabetes mellitus with diabetic polyneuropathy: Secondary | ICD-10-CM

## 2015-11-20 ENCOUNTER — Encounter: Payer: Self-pay | Admitting: *Deleted

## 2015-11-20 DIAGNOSIS — H52203 Unspecified astigmatism, bilateral: Secondary | ICD-10-CM | POA: Diagnosis not present

## 2015-11-20 DIAGNOSIS — H2513 Age-related nuclear cataract, bilateral: Secondary | ICD-10-CM | POA: Diagnosis not present

## 2015-11-20 DIAGNOSIS — E113493 Type 2 diabetes mellitus with severe nonproliferative diabetic retinopathy without macular edema, bilateral: Secondary | ICD-10-CM | POA: Diagnosis not present

## 2015-11-20 DIAGNOSIS — H524 Presbyopia: Secondary | ICD-10-CM | POA: Diagnosis not present

## 2015-11-20 LAB — HM DIABETES EYE EXAM

## 2015-11-27 ENCOUNTER — Encounter: Payer: Self-pay | Admitting: *Deleted

## 2015-12-11 ENCOUNTER — Ambulatory Visit (INDEPENDENT_AMBULATORY_CARE_PROVIDER_SITE_OTHER): Payer: Medicare Other | Admitting: Internal Medicine

## 2015-12-11 ENCOUNTER — Encounter: Payer: Self-pay | Admitting: Internal Medicine

## 2015-12-11 VITALS — BP 157/98 | HR 87 | Temp 97.9°F | Ht 65.0 in | Wt 278.1 lb

## 2015-12-11 DIAGNOSIS — Z7984 Long term (current) use of oral hypoglycemic drugs: Secondary | ICD-10-CM

## 2015-12-11 DIAGNOSIS — I10 Essential (primary) hypertension: Secondary | ICD-10-CM | POA: Diagnosis not present

## 2015-12-11 DIAGNOSIS — Z79899 Other long term (current) drug therapy: Secondary | ICD-10-CM

## 2015-12-11 DIAGNOSIS — E785 Hyperlipidemia, unspecified: Secondary | ICD-10-CM

## 2015-12-11 DIAGNOSIS — Z789 Other specified health status: Secondary | ICD-10-CM | POA: Insufficient documentation

## 2015-12-11 DIAGNOSIS — Z889 Allergy status to unspecified drugs, medicaments and biological substances status: Secondary | ICD-10-CM | POA: Diagnosis not present

## 2015-12-11 DIAGNOSIS — E1142 Type 2 diabetes mellitus with diabetic polyneuropathy: Secondary | ICD-10-CM | POA: Diagnosis not present

## 2015-12-11 DIAGNOSIS — Z6841 Body Mass Index (BMI) 40.0 and over, adult: Secondary | ICD-10-CM

## 2015-12-11 HISTORY — DX: Other specified health status: Z78.9

## 2015-12-11 LAB — GLUCOSE, CAPILLARY: GLUCOSE-CAPILLARY: 107 mg/dL — AB (ref 65–99)

## 2015-12-11 LAB — POCT GLYCOSYLATED HEMOGLOBIN (HGB A1C): HEMOGLOBIN A1C: 8

## 2015-12-11 MED ORDER — SITAGLIPTIN PHOSPHATE 100 MG PO TABS: 100.0000 mg | ORAL_TABLET | Freq: Every day | ORAL | Status: DC

## 2015-12-11 NOTE — Assessment & Plan Note (Addendum)
Patients HbA1c today is 8%, increased from 7.3 in 4/17. Januvia 143m once daily added for additional glycemic control. Patient is to continue Metformin 10060mBID as well as Glipizide 1075maily.  The importance of diet and exercise was discussed with the patient and she verbalized understanding.  Patient recently had BMP which showed normal creatinine clearance.   Urine microalbumin ordered for today.  Diabetic foot exam performed today.  Patient had annual eye exam this month.   Patient to continue gabapentin 300m79mD for chronic peripheral neuropathy

## 2015-12-11 NOTE — Assessment & Plan Note (Signed)
Discussed importance of diet and exercise. Patient reports exercising 3x per week at gym.

## 2015-12-11 NOTE — Assessment & Plan Note (Addendum)
Patient not at goal with a total cholesterol of 237, LDL of 146 and TG of 184. Patient was previously on pravastatin 61m twice weekly however had statin-myalgias. Will check vitamin D level with goal of restarting statin next visit if able.

## 2015-12-11 NOTE — Patient Instructions (Addendum)
1. Today we talked about your diabetes control. Your HbA1c was increased at 8% today. We are adding Januvia 176m once daily. Please check price with insurance and let me know if it's not affordable.  2. Please continue diet and exercise program.  3. We will check your Vitamin D level today. We will call if results are abnormal and will discuss plan further.  Sitagliptin (Januvia) oral tablet What is this medicine? SITAGLIPTIN (sit a GLIP tin) helps to treat type 2 diabetes. It helps to control blood sugar. Treatment is combined with diet and exercise. This medicine may be used for other purposes; ask your health care provider or pharmacist if you have questions. What should I tell my health care provider before I take this medicine? They need to know if you have any of these conditions: -diabetic ketoacidosis -kidney disease -pancreatitis -previous swelling of the tongue, face, or lips with difficulty breathing, difficulty swallowing, hoarseness, or tightening of the throat -type 1 diabetes -an unusual or allergic reaction to sitagliptin, other medicines, foods, dyes, or preservatives -pregnant or trying to get pregnant -breast-feeding How should I use this medicine? Take this medicine by mouth with a glass of water. Follow the directions on the prescription label. You can take it with or without food. Do not cut, crush or chew this medicine. Take your dose at the same time each day. Do not take more often than directed. Do not stop taking except on your doctor's advice. Talk to your pediatrician regarding the use of this medicine in children. Special care may be needed. Overdosage: If you think you have taken too much of this medicine contact a poison control center or emergency room at once. NOTE: This medicine is only for you. Do not share this medicine with others. What if I miss a dose? If you miss a dose, take it as soon as you can. If it is almost time for your next dose, take only  that dose. Do not take double or extra doses. What may interact with this medicine? Do not take this medicine with any of the following medications: -gatifloxacin This medicine may also interact with the following medications: -alcohol -digoxin -insulin -sulfonylureas like glimepiride, glipizide, glyburide This list may not describe all possible interactions. Give your health care provider a list of all the medicines, herbs, non-prescription drugs, or dietary supplements you use. Also tell them if you smoke, drink alcohol, or use illegal drugs. Some items may interact with your medicine. What should I watch for while using this medicine? Visit your doctor or health care professional for regular checks on your progress. A test called the HbA1C (A1C) will be monitored. This is a simple blood test. It measures your blood sugar control over the last 2 to 3 months. You will receive this test every 3 to 6 months. Learn how to check your blood sugar. Learn the symptoms of low and high blood sugar and how to manage them. Always carry a quick-source of sugar with you in case you have symptoms of low blood sugar. Examples include hard sugar candy or glucose tablets. Make sure others know that you can choke if you eat or drink when you develop serious symptoms of low blood sugar, such as seizures or unconsciousness. They must get medical help at once. Tell your doctor or health care professional if you have high blood sugar. You might need to change the dose of your medicine. If you are sick or exercising more than usual, you might need  to change the dose of your medicine. Do not skip meals. Ask your doctor or health care professional if you should avoid alcohol. Many nonprescription cough and cold products contain sugar or alcohol. These can affect blood sugar. Wear a medical ID bracelet or chain, and carry a card that describes your disease and details of your medicine and dosage times. What side effects  may I notice from receiving this medicine? Side effects that you should report to your doctor or health care professional as soon as possible: -allergic reactions like skin rash, itching or hives, swelling of the face, lips, or tongue -breathing problems -fever, chills -joint pain -loss of appetite -signs and symptoms of low blood sugar such as feeling anxious, confusion, dizziness, increased hunger, unusually weak or tired, sweating, shakiness, cold, irritable, headache, blurred vision, fast heartbeat, loss of consciousness -unusual stomach pain or discomfort -vomiting Side effects that usually do not require medical attention (report to your doctor or health care professional if they continue or are bothersome): -diarrhea -headache -sore throat -stomach upset -stuffy or runny nose This list may not describe all possible side effects. Call your doctor for medical advice about side effects. You may report side effects to FDA at 1-800-FDA-1088. Where should I keep my medicine? Keep out of the reach of children. Store at room temperature between 15 and 30 degrees C (59 and 86 degrees F). Throw away any unused medicine after the expiration date. NOTE: This sheet is a summary. It may not cover all possible information. If you have questions about this medicine, talk to your doctor, pharmacist, or health care provider.    2016, Elsevier/Gold Standard. (2014-01-27 17:46:21)

## 2015-12-11 NOTE — Progress Notes (Signed)
   CC: follow up of diabetes.   HPI:  [name] is a 65 y.o. female with history of HTN, HLD and severe obesity who presents for follow up of diabetes. Todays HbA1c was 8%, up from 7.6% in April. She reports eating poorly due to it being summer. She has no complaints at this time and denies any episodes of symptomatic hypoglycemia. She reports medication compliance.   She reports having her yearly diabetic eye exam this month.   Past Medical History  Diagnosis Date  . DIABETES MELLITUS, TYPE II 03/17/2006        . HYPERLIPIDEMIA 03/17/2006    Qualifier: Diagnosis of  By: Sharlet Salina MD, [place]    . HYPERTENSION 03/17/2006        . Morbid obesity ([place]) 01/25/2008    Qualifier: Diagnosis of  By: Phifer MD, Izora Gala      Review of Systems: She denies any chest pain, shortness of breath, fever, chills or abdominal pain. She reports blurred vision (chronic).  Physical Exam:  Filed Vitals:   12/11/15 1500  BP: 157/98  Pulse: 87  Temp: 97.9 F (36.6 C)  TempSrc: Oral  Height: 5' 5"  (1.651 m)  Weight: 278 lb 1.6 oz (126.145 kg)  SpO2: 100%   Physical Exam  Constitutional:  Well developed, in no distress.  Cardiovascular: Normal rate and regular rhythm.   Pulmonary/Chest: Effort normal. No respiratory distress. She has no wheezes. She has no rales.  Abdominal: Soft. She exhibits no distension. There is no tenderness.  Skin: Skin is warm and dry. No rash noted.  BL Foot examination reveals no ulcerations.    Assessment & Plan:   See encounters tab for problem based medical decision making.   Patient seen with Dr. Angelia Mould

## 2015-12-12 LAB — MICROALBUMIN / CREATININE URINE RATIO
Creatinine, Urine: 164.9 mg/dL
MICROALB/CREAT RATIO: 146.9 mg/g{creat} — AB (ref 0.0–30.0)
Microalbumin, Urine: 242.2 ug/mL

## 2015-12-12 LAB — VITAMIN D 25 HYDROXY (VIT D DEFICIENCY, FRACTURES): Vit D, 25-Hydroxy: 17.3 ng/mL — ABNORMAL LOW (ref 30.0–100.0)

## 2015-12-12 NOTE — Progress Notes (Signed)
Internal Medicine Clinic Attending  I saw and evaluated the patient.  I personally confirmed the key portions of the history and exam documented by Dr. Danford Bad and I reviewed pertinent patient test results.  The assessment, diagnosis, and plan were formulated together and I agree with the documentation in the resident's note. Diabetic foot exam was completed and is documented in the EHR.

## 2015-12-12 NOTE — Addendum Note (Signed)
Addended by: Joni Reining C on: 12/12/2015 10:43 AM   Modules accepted: Level of Service

## 2015-12-12 NOTE — Assessment & Plan Note (Signed)
Check VIt D and TSH

## 2015-12-12 NOTE — Assessment & Plan Note (Signed)
A: Essential HTN not at goal  P: Continue Amlodipine 80m, coreg 266mBID, Lisinopril HCTZ 20-255miscussed diet and exercise.

## 2015-12-13 NOTE — Addendum Note (Signed)
Addended by: Joni Reining C on: 12/13/2015 01:44 PM   Modules accepted: Level of Service

## 2015-12-18 ENCOUNTER — Telehealth: Payer: Self-pay | Admitting: Internal Medicine

## 2015-12-18 MED ORDER — VITAMIN D (ERGOCALCIFEROL) 1.25 MG (50000 UNIT) PO CAPS: 50000.0000 [IU] | ORAL_CAPSULE | ORAL | Status: DC

## 2015-12-18 NOTE — Telephone Encounter (Signed)
Spoke to patient over phone re: results of vitamin d lab. Patient informed of vitamin deficiency and agreed to starting a 12 week course of once weekly 50,000 units.  Patient also informed me that Januvia was $500 from pharmacy and that she did not order it. Will work on finding a more cost effective treatment of her diabetes. Patient prefers no needles.

## 2015-12-19 ENCOUNTER — Telehealth: Payer: Self-pay

## 2015-12-19 NOTE — Telephone Encounter (Signed)
Assisted her with how to buy otc vitamin

## 2015-12-19 NOTE — Telephone Encounter (Signed)
Requesting the nurse to call back regarding vitamin D.

## 2015-12-21 ENCOUNTER — Other Ambulatory Visit: Payer: Self-pay | Admitting: *Deleted

## 2015-12-21 DIAGNOSIS — N898 Other specified noninflammatory disorders of vagina: Secondary | ICD-10-CM

## 2015-12-21 MED ORDER — FLUCONAZOLE 150 MG PO TABS: 150.0000 mg | ORAL_TABLET | Freq: Once | ORAL | Status: DC

## 2015-12-24 ENCOUNTER — Other Ambulatory Visit: Payer: Self-pay

## 2015-12-24 NOTE — Telephone Encounter (Signed)
Requesting Rx for Vitamin D. Please call pt back.

## 2015-12-25 ENCOUNTER — Other Ambulatory Visit: Payer: Self-pay | Admitting: *Deleted

## 2015-12-25 MED ORDER — VITAMIN D (ERGOCALCIFEROL) 1.25 MG (50000 UNIT) PO CAPS: 50000.0000 [IU] | ORAL_CAPSULE | ORAL | 0 refills | Status: DC

## 2015-12-25 NOTE — Telephone Encounter (Signed)
Pt calls and states [facility] told her the copay was 38.00 and she cannot afford it, states [facility] is cheaper. Please send a new script as [facility] does not transfer

## 2015-12-28 NOTE — Telephone Encounter (Signed)
Per previous note, Bonnita Nasuti told her how to buy over the counter.

## 2016-01-28 ENCOUNTER — Ambulatory Visit (INDEPENDENT_AMBULATORY_CARE_PROVIDER_SITE_OTHER): Payer: Medicare Other | Admitting: Internal Medicine

## 2016-01-28 ENCOUNTER — Ambulatory Visit (HOSPITAL_COMMUNITY)
Admission: RE | Admit: 2016-01-28 | Discharge: 2016-01-28 | Disposition: A | Discharge status: Home / Self Care | Payer: Medicare Other | Source: Ambulatory Visit | Attending: Internal Medicine | Admitting: Internal Medicine

## 2016-01-28 VITALS — BP 143/61 | HR 86 | Temp 97.7°F | Ht 66.0 in | Wt 278.9 lb

## 2016-01-28 DIAGNOSIS — M25572 Pain in left ankle and joints of left foot: Secondary | ICD-10-CM | POA: Diagnosis not present

## 2016-01-28 DIAGNOSIS — E1142 Type 2 diabetes mellitus with diabetic polyneuropathy: Secondary | ICD-10-CM | POA: Diagnosis not present

## 2016-01-28 DIAGNOSIS — M79672 Pain in left foot: Secondary | ICD-10-CM | POA: Insufficient documentation

## 2016-01-28 DIAGNOSIS — M7989 Other specified soft tissue disorders: Secondary | ICD-10-CM | POA: Diagnosis not present

## 2016-01-28 DIAGNOSIS — I10 Essential (primary) hypertension: Secondary | ICD-10-CM

## 2016-01-28 DIAGNOSIS — Z6841 Body Mass Index (BMI) 40.0 and over, adult: Secondary | ICD-10-CM

## 2016-01-28 NOTE — Assessment & Plan Note (Signed)
Most recent HbA1c 8% 12/2015. Patient reports she was unable to afford Januvia that was prescribed at last visit. She reports compliance of other diabetic medications consisting of Glipizide 10 mg daily and Metformin 1000 mg BID. We discussed the importance of diet in control of her diabetes and pt verbalized understanding.  -Feet were examined again today and there was no evidence of ulcerations. Patient does report decreased sensation in feet however reports she checks her feet regularly.  -Gabapentin 300 mg TID for chronic peripheral neuropathy continued as well.

## 2016-01-28 NOTE — Progress Notes (Signed)
Internal Medicine Clinic Attending  I saw and evaluated the patient.  I personally confirmed the key portions of the history and exam documented by Dr. Danford Bad and I reviewed pertinent patient test results.  The assessment, diagnosis, and plan were formulated together and I agree with the documentation in the resident's note. Xray showed nl L foot.

## 2016-01-28 NOTE — Progress Notes (Signed)
   CC: left foot pain.  HPI:  [name] is a 65 y.o. female with medical history significant for diabetes, morbid obesity, HTN and HLD who presents to the clinic for evaluation of left foot pain. Patient reports that last Friday she gradually developed worsening left foot pain, swelling and bruising, most pronounced on the lateral aspect. She denies any trauma to the area, rolling her ankle, tripping, falling or walking on uneven surfaces. Denies any loud "pops" or "cracks." She reports that when she walks it feels like there is a pebble under he heel.  Pain is exacerbated by standing or walking however patient is still able to walk. She reports some improvement of her pain with aspirin. She has been icing her foot and elevating it as well, however hasn't noticed a different in her symptoms with these measures.     Past Medical History:  Diagnosis Date  . DIABETES MELLITUS, TYPE II 03/17/2006       . HYPERLIPIDEMIA 03/17/2006   Qualifier: Diagnosis of  By: Sharlet Salina MD, [place]    . HYPERTENSION 03/17/2006       . Morbid obesity ([place]) 01/25/2008   Qualifier: Diagnosis of  By: Phifer MD, Izora Gala      Review of Systems:  Review of Systems  Respiratory: Negative for cough and shortness of breath.   Cardiovascular: Positive for leg swelling. Negative for chest pain.  Gastrointestinal: Negative for blood in stool, nausea and vomiting.  Musculoskeletal: Positive for joint pain. Negative for falls.  Neurological: Negative for dizziness, tingling and sensory change.    Physical Exam: Physical Exam  Constitutional: She is well-developed, well-nourished, and in no distress.  HENT:  Head: Normocephalic and atraumatic.  Cardiovascular: Normal rate and regular rhythm.   No murmur heard. Pulmonary/Chest: Effort normal and breath sounds normal. No respiratory distress.  Abdominal: Soft. Bowel sounds are normal. She exhibits no distension. There is no tenderness.  Musculoskeletal:   Right ankle: Normal.       Left ankle: She exhibits swelling, ecchymosis and abnormal pulse. Tenderness. Lateral malleolus and head of 5th metatarsal tenderness found.  Discoloration present of left lateral ankle, evidence of prior ecchymosis.     Vitals:   01/28/16 1030  BP: (!) 143/61  Pulse: 86  Temp: 97.7 F (36.5 C)  TempSrc: Oral  SpO2: 100%  Weight: 278 lb 14.4 oz (126.5 kg)  Height: 5' 6"  (1.676 m)     Assessment & Plan:   See Encounters Tab for problem based charting.  Patient seen with Dr. Lynnae January

## 2016-01-28 NOTE — Assessment & Plan Note (Signed)
Patient reports that she gradually developed left lateral foot/ankle pain, swelling and bruising. Denies trauma, rolling of ankle, vigorous activity or any inciting event. She reports she has rolled her ankle before but has never experienced these particular symptoms. She has tried elevating her legs, biofreeze and aspirin without much relief.  On physical exam the left foot is mildly but noticeably swollen vs right, moreso on the lateral aspect.  -X-ray ordered of left foot for evaluation. Doubt stress fracture however patient refused DEXA scan in the past so unable to r/o osteoporosis.  -Patient instructed to use ice, naproxen and elevation of her lower extremity until results of X-ray are back.

## 2016-01-28 NOTE — Assessment & Plan Note (Signed)
Todays blood pressure 143/61. This is likely slightly elevated secondary to patients pain. We will monitor this and check at next appointment.  -Amlodipine 56m, Coreg 25 mg continued.

## 2016-01-28 NOTE — Patient Instructions (Addendum)
It was a pleasures seeing you today!   1. Today we talked about your left lateral foot pain. For evaluation of this, I have ordered a left foot x-ray. I will call you with the results. Until that time, please continue to use Ice for 20 minutes 3 times a day and you may also take an NSAID. I recommend Naproxen 500 mg twice a day for the next several days (do not take more than 1250 mg a day). Also, please continue elevating your feet.  2. Please return to the clinic if your symptoms do not improve in 1 week OR if they worsen. Otherwise, I will see you at your regularly scheduled appointment in 2 months.

## 2016-06-24 ENCOUNTER — Ambulatory Visit (INDEPENDENT_AMBULATORY_CARE_PROVIDER_SITE_OTHER): Payer: Medicare Other | Admitting: Internal Medicine

## 2016-06-24 DIAGNOSIS — J208 Acute bronchitis due to other specified organisms: Secondary | ICD-10-CM | POA: Diagnosis not present

## 2016-06-24 MED ORDER — GUAIFENESIN-CODEINE 100-10 MG/5ML PO SOLN: 5.0000 mL | Freq: Four times a day (QID) | ORAL | 0 refills | Status: DC | PRN

## 2016-06-24 NOTE — Assessment & Plan Note (Signed)
Patient was attending a history of cough and sore throat. She denies any other breast or symptoms including sinus pain and congestion, rhinorrhea, ear pain, no fevers. She does report chills at home. She has not tried an over-the-counter remedies other than throat lozenges. This provided some minimal relief. Patient also notes that she has had significant difficulty at night due to this cough with sleeping. Keeping her up. She notes some wheezing and/or upper airway sounds which have been bothering her at night.  Patient denies any history of asthma or other pulmonary disease. She has diabetes which is currently well controlled. She does not have any significant tenderness over her neck. She has no significant shortness of breath. On exam her oxygen saturation is within normal limits, she has no cervical lymphadenopathy. Erythema in the posterior oropharynx. She has no tenderness over sinuses. Tympanic membranes are wnl.lungs are clear to auscultation bilaterally.  I feel this most consistent with a viral bronchitis and/or a postviral bronchitis. Patient does have some productive cough and upper airway congestion.  Plan: Antibiotics are not indicated at this time. Centor criteria are negative. Symptomatically therapy with guaifenesin/codeine cough syrup given. Good Rx coupon provided for [facility]. Anticipatory guidance given if her symptoms should worsen.

## 2016-06-24 NOTE — Progress Notes (Signed)
CC: cough and sore throat HPI: [name] is a 65 y.o. female with a h/o of t2DM, HTN, HLD who presents w/ 10 day h/o cough and sore throat.  Please see Problem-based charting for HPI and the status of patient's chronic medical conditions.  Past Medical History:  Diagnosis Date  . DIABETES MELLITUS, TYPE II 03/17/2006       . HYPERLIPIDEMIA 03/17/2006   Qualifier: Diagnosis of  By: Sharlet Salina MD, [place]    . HYPERTENSION 03/17/2006       . Morbid obesity ([place]) 01/25/2008   Qualifier: Diagnosis of  By: Phifer MD, Izora Gala     Social History  Substance Use Topics  . Smoking status: Never Smoker  . Smokeless tobacco: Not on file  . Alcohol use No   No family history on file. Review of Systems: ROS in HPI. Otherwise: Review of Systems  Constitutional: Negative for chills, fever and weight loss.  HENT: Positive for sore throat. Negative for congestion, ear discharge, ear pain, hearing loss, nosebleeds, sinus pain and tinnitus.   Eyes: Negative for blurred vision.  Respiratory: Positive for cough, sputum production and wheezing. Negative for hemoptysis, shortness of breath and stridor.   Cardiovascular: Negative for chest pain and leg swelling.  Gastrointestinal: Negative for abdominal pain, constipation, diarrhea, nausea and vomiting.  Genitourinary: Negative for dysuria, frequency and urgency.  Musculoskeletal: Negative for myalgias.  Neurological: Positive for dizziness.   Physical Exam: Vitals:   06/24/16 1539  BP: 128/62  Pulse: 81  Temp: 97.8 F (36.6 C)  TempSrc: Oral  SpO2: 100%  Weight: 279 lb 8 oz (126.8 kg)  Height: 5' 5.5" (1.664 m)   Physical Exam  Constitutional: She appears well-developed. She is cooperative. No distress.  HENT:  Right Ear: Tympanic membrane normal.  Left Ear: Tympanic membrane normal.  Nose: No mucosal edema. Right sinus exhibits no maxillary sinus tenderness. Left sinus exhibits no maxillary sinus tenderness.  Mouth/Throat:  Posterior oropharyngeal erythema present. No oropharyngeal exudate, posterior oropharyngeal edema or tonsillar abscesses. Tonsils are 0 on the right. Tonsils are 0 on the left. No tonsillar exudate.  Neck: Normal range of motion. Neck supple.  Cardiovascular: Normal rate, regular rhythm, normal heart sounds and normal pulses.  Exam reveals no gallop.   No murmur heard. Pulmonary/Chest: Effort normal and breath sounds normal. No stridor. No respiratory distress. She has no wheezes. She has no rhonchi. She has no rales. Breasts are symmetrical.  Abdominal: Soft. Bowel sounds are normal. There is no tenderness.  Musculoskeletal: She exhibits no edema or tenderness.  Lymphadenopathy:    She has no cervical adenopathy.    Assessment & Plan:  See encounters tab for problem based medical decision making. Patient discussed with Dr. Eppie Gibson  Acute viral bronchitis Patient was attending a history of cough and sore throat. She denies any other breast or symptoms including sinus pain and congestion, rhinorrhea, ear pain, no fevers. She does report chills at home. She has not tried an over-the-counter remedies other than throat lozenges. This provided some minimal relief. Patient also notes that she has had significant difficulty at night due to this cough with sleeping. Keeping her up. She notes some wheezing and/or upper airway sounds which have been bothering her at night.  Patient denies any history of asthma or other pulmonary disease. She has diabetes which is currently well controlled. She does not have any significant tenderness over her neck. She has no significant shortness of breath. On exam her oxygen saturation  is within normal limits, she has no cervical lymphadenopathy. Erythema in the posterior oropharynx. She has no tenderness over sinuses. Tympanic membranes are wnl.lungs are clear to auscultation bilaterally.  I feel this most consistent with a viral bronchitis and/or a postviral bronchitis.  Patient does have some productive cough and upper airway congestion.  Plan: Antibiotics are not indicated at this time. Centor criteria are negative. Symptomatically therapy with guaifenesin/codeine cough syrup given. [facility]. Anticipatory guidance given if her symptoms should worsen.   Signed: Holley Raring, MD 06/24/2016, 4:18 PM  Pager: [phone]

## 2016-06-24 NOTE — Patient Instructions (Signed)
You have a viral bronchitis. This should resolve on its own in the next 7-10 days. Please take this cough syrup I have prescribed as needed for your cough and sore throat.

## 2016-06-24 NOTE — Progress Notes (Signed)
Case discussed with Dr. Strelow at time of visit.  We reviewed the resident's history and exam and pertinent patient test results.  I agree with the assessment, diagnosis, and plan of care documented in the resident's note. 

## 2016-07-01 ENCOUNTER — Ambulatory Visit (INDEPENDENT_AMBULATORY_CARE_PROVIDER_SITE_OTHER): Payer: Medicare Other | Admitting: Internal Medicine

## 2016-07-01 VITALS — BP 145/87 | HR 79 | Temp 97.8°F | Ht 65.5 in | Wt 281.7 lb

## 2016-07-01 DIAGNOSIS — E1142 Type 2 diabetes mellitus with diabetic polyneuropathy: Secondary | ICD-10-CM

## 2016-07-01 DIAGNOSIS — Z7984 Long term (current) use of oral hypoglycemic drugs: Secondary | ICD-10-CM | POA: Diagnosis not present

## 2016-07-01 DIAGNOSIS — M2141 Flat foot [pes planus] (acquired), right foot: Secondary | ICD-10-CM | POA: Diagnosis not present

## 2016-07-01 DIAGNOSIS — I10 Essential (primary) hypertension: Secondary | ICD-10-CM | POA: Diagnosis not present

## 2016-07-01 DIAGNOSIS — M2140 Flat foot [pes planus] (acquired), unspecified foot: Secondary | ICD-10-CM

## 2016-07-01 DIAGNOSIS — Z6841 Body Mass Index (BMI) 40.0 and over, adult: Secondary | ICD-10-CM

## 2016-07-01 DIAGNOSIS — Z79899 Other long term (current) drug therapy: Secondary | ICD-10-CM

## 2016-07-01 LAB — POCT GLYCOSYLATED HEMOGLOBIN (HGB A1C): HEMOGLOBIN A1C: 7.9

## 2016-07-01 LAB — GLUCOSE, CAPILLARY: GLUCOSE-CAPILLARY: 179 mg/dL — AB (ref 65–99)

## 2016-07-01 NOTE — Patient Instructions (Addendum)
It was a pleasure seeing you today!   1. Today we talked about your right foot pain. I'm sorry you are still having this pain. I believe your symptoms are due to having poor arch support, this is known as having a "flat foot." For this, I am referring you to a podiatrist (foot doctor). In the meantime until that appointment is scheduled I have some exercises for you to do which will strengthen the arch of your foot: -"Tip-Toes" -"Toe grasps" 2. You may also take Ibuprofen or Naproxen or Tylenol as needed.  3. Today we also talked about your diabetes. You are doing well with your current regimen and your HbA1c is improved today at 7.9%. Lets keep working on diet and exercise! This will help lower your blood sugar as well.  4. See [place] back in 1-3 months for follow up!  Flat Feet Flat feet, also called fallen arches, are feet that do not have a curve (arch) on the inner side of the foot. Normally, an arch develops during childhood and creates a gap between the foot and the ground. Flat feet rest entirely on the ground, without a gap. In some cases, an arch never develops. In other cases, an arch develops and later collapses.  This is a common condition that can affect one foot or both feet. CAUSES This condition may be caused by:  Failure of normal arch development during childhood.  Injury to connective tissues (tendons and ligaments) in the foot, such as the tendon that supports the arch (posterior tibial tendon).  Loose tendons or ligaments in the foot.  A wearing down of the arch over time.  Injury to bones in the foot.  Abnormality in the bones of the foot (tarsal coalition). This is when two or more bones in the foot are joined together (fused) during development before birth. This limits foot movement and can cause flat feet. RISK FACTORS This condition is more common in females. It is also more likely to develop in people who:  Are 40 years or older.  Have a family history of flat  feet.  Have a history of childhood flexible flatfoot. This is a common childhood condition in which the arch disappears when a child is standing but is present when a child is sitting or standing on tiptoes.  Are obese.  Have diabetes.  Have high blood pressure.  Participate in high-impact sports that can damage the posterior tibial tendon.  Have inflammatory arthritis.  Have a history of broken (fractured) or dislocated bones in the foot. SYMPTOMS Symptoms of this condition may include:  Pain or tightness along the bottom of the foot.  Foot pain that gets worse with activity.  Swelling of the inner foot.  Swelling of the ankle.  Pain on the outside of the ankle.  Changes in the way you walk (gait).  Pronation. This is when the foot and ankle lean inward when standing.  Bony bumps on the top or inside of the foot. DIAGNOSIS This condition is diagnosed with a physical exam of your foot and ankle. Your health care provider may look at your shoes for patterns of wear on the soles. You may also have tests, including X-rays, CT scan, or MRI. You may be referred to a health care provider who specializes in feet (podiatrist) or a physical therapist. TREATMENT Treatment may include one or more of the following:  Stretching exercises or physical therapy to lengthen the heel tendon (Achilles tendon), increase range of motion, and relieve  pain.  Shoe inserts (orthotics) to support the arches of your feet. These can be purchased from a store or they can be custom-made by your health care provider.  Wearing shoes with appropriate arch support. This is especially important for athletes. Your health care provider may recommend shoes for you to wear.  Medicines to relieve pain.  An ankle brace, boot, or cast to relieve pressure on your foot. You may be given crutches if walking is painful.  Surgery to improve alignment of your foot. This is only needed if your posterior tibial tendon  is torn, or if you have tarsal coalition. HOME CARE INSTRUCTIONS  Take over-the-counter and prescription medicines only as told by your health care provider.  Wear orthotics and appropriate shoes as told by your health care provider.  Rest your feet and avoid or change activities that cause pain.  Perform exercises to strengthen and stretch your feet as told by your health care provider or physical therapist.  If you have nerve damage from diabetes, check your feet daily for swelling, sores, blisters, or calluses. PREVENTION Flat feet cannot always be prevented. However, you can take these actions to help reduce your chance of developing flat feet or to help prevent your current condition from getting worse:  Wear comfortable, supportive shoes that are appropriate for your activities.  Maintain a healthy weight.  Stay active, as told by your health care provider. This will help to keep your feet flexible and strong.  Manage long-term (chronic) health conditions, such as diabetes, high blood pressure, and inflammatory arthritis, if this applies.  Work with a health care provider if you have concerns about your feet or your shoes. SEEK MEDICAL CARE IF:  You have pain in your foot or lower leg that gets worse or does not improve with medicine.  You have pain or difficulty when walking.  You have problems with your orthotics or your shoes. This information is not intended to replace advice given to you by your health care provider. Make sure you discuss any questions you have with your health care provider. Document Released: 03/16/2009 Document Revised: 09/10/2015 Document Reviewed: 11/15/2014 Elsevier Interactive Patient Education  2017 [employer].

## 2016-07-03 NOTE — Assessment & Plan Note (Signed)
Morbidly obese patient here with several month (?year) history of foot pain, worse with standing or ambulation and relieved by wearing supportive shoes and rest. Physical exam shows pes planus with medial fullness of the midfoot. No point tenderness to suggest stress fracture. No hindfoot tenderness to suggest plantar fasciitis. No evidence for mortons neuroma with compression of the metatarsal heads. Suspect symptoms due to worsening pes planus.  -Referred to podiatry for assistance, would appreciate their recs re: orthotics, exercises to strengthen and raise the arch, etc -Encouraged some exercises to strengthen arch (Tip toes, toe grasps) -Encouraged continuing to wear supportive shoes and trying OTC orthotics specifically to support the midfoot -Could try naproxen, ibuprofen or tylenol as well.

## 2016-07-03 NOTE — Assessment & Plan Note (Addendum)
Lab Results  Component Value Date   HGBA1C 7.9 07/01/2016   HGBA1C 8.0 12/11/2015   HGBA1C 7.6 09/07/2015     Assessment: Diabetes control:  Fair Progress toward A1C goal:   Near goal  Plan: Medications:  Metformin 2068m daily, Glipizide 10 mg daily, Januvia 100 mg daily.  Instruction/counseling given: Continue current regimen, improve diet to get better glycemic control.  Will need repeat HbA1c 09/2016  On Lisinopril.

## 2016-07-03 NOTE — Assessment & Plan Note (Signed)
BP Readings from Last 3 Encounters:  07/01/16 (!) 145/87  06/24/16 128/62  01/28/16 (!) 143/61    Lab Results  Component Value Date   NA 140 09/07/2015   K 4.4 09/07/2015   CREATININE 0.92 09/07/2015    Assessment: Blood pressure control:  Near goal Progress toward BP goal: Approaching goal  Plan: Medications:  Lisinopril-HCTZ 20-25, Amlodipine 10. Pt would not allow me to adjust medications this visit. Other plans: Encouraged diet, exercise and weight loss

## 2016-07-03 NOTE — Progress Notes (Signed)
Case discussed with Dr. Danford Bad at the time of the visit.  We reviewed the resident's history and exam and pertinent patient test results.  I agree with the assessment, diagnosis and plan of care documented in the resident's note.

## 2016-07-03 NOTE — Progress Notes (Signed)
   CC: right foot pain  HPI:  [name] is a very pleasant 66 y.o. F with medical history as outlined below who presents for evaluation of right foot pain.  Right foot pain: Present for several months, first noticed when she was walking approximately 2-3 miles. Pain comes on with ambulation, most apparent when barefoot, and feels like a dull pain under the midfoot with medial radiation. Worse the longer she's on her feet and is relieved with rest.  Reports wearing shoes with increased arch support and ankle support improve symptoms significantly. No heel pain, forefoot or hindfoot pain. Denied trauma or prior fracture at site. Reports biofreeze improves discomfort temporarily.   Type 2 Diabetes: Compliant with Januvia 100 mg, GLipizide 55m daily, Metformin 2000 mg daily. Most recent HbA1c 8%. On Lisinopril.   HTN: Nearly controlled with Lisinopril-HCTZ 20-25 mg. BP today slightly above goal.   Past Medical History:  Diagnosis Date  . DIABETES MELLITUS, TYPE II 03/17/2006       . HYPERLIPIDEMIA 03/17/2006   Qualifier: Diagnosis of  By: CSharlet SalinaMD, [place]    . HYPERTENSION 03/17/2006       . [place] 01[place]25[place]2008   Qualifier: Diagnosis of  By: Phifer MD, NIzora Gala     Review of Systems:  Review of Systems  Constitutional: Negative for chills, fever and malaise/fatigue.  Respiratory: Negative for cough and shortness of breath.   Cardiovascular: Negative for chest pain, palpitations and leg swelling.  Gastrointestinal: Negative for abdominal pain, nausea and vomiting.  Musculoskeletal: Negative for falls and joint pain.  Neurological: Negative for tingling, focal weakness and weakness.    Physical Exam: Physical Exam  Constitutional: She is well-developed, well-nourished, and in no distress. No distress.  Cardiovascular: Normal rate, regular rhythm and normal heart sounds.   Pulmonary/Chest: Effort normal and breath sounds normal. No respiratory distress. She has no  wheezes.  Abdominal: Soft. Bowel sounds are normal. She exhibits no distension. There is no tenderness.  Musculoskeletal: She exhibits deformity.  Right Foot: Edema and fullness of medial aspect of foot. Intact distal pulses. ROM limited, stiff foot. Pes planus present. Pronation with ambulation. No point tenderness.   Skin: Skin is warm and dry. No rash noted. She is not diaphoretic. No erythema.    Vitals:   07/01/16 1437  BP: (!) 145/87  Pulse: 79  Temp: 97.8 F (36.6 C)  TempSrc: Oral  SpO2: 99%  Weight: 281 lb 11.2 oz (127.8 kg)  Height: 5' 5.5" (1.664 m)    Assessment & Plan:   See Encounters Tab for problem based charting.  Patient seen with Dr. KEppie Gibson

## 2016-07-04 ENCOUNTER — Other Ambulatory Visit: Payer: Self-pay | Admitting: Internal Medicine

## 2016-07-04 DIAGNOSIS — E113299 Type 2 diabetes mellitus with mild nonproliferative diabetic retinopathy without macular edema, unspecified eye: Secondary | ICD-10-CM

## 2016-07-09 NOTE — Addendum Note (Signed)
Addended by: Hulan Fray on: 07/09/2016 06:51 PM   Modules accepted: Orders

## 2016-09-02 ENCOUNTER — Other Ambulatory Visit: Payer: Self-pay

## 2016-09-02 DIAGNOSIS — N898 Other specified noninflammatory disorders of vagina: Secondary | ICD-10-CM

## 2016-09-02 MED ORDER — FLUCONAZOLE 150 MG PO TABS: 150.0000 mg | ORAL_TABLET | Freq: Once | ORAL | 3 refills | Status: DC

## 2016-09-12 ENCOUNTER — Other Ambulatory Visit: Payer: Self-pay

## 2016-09-12 DIAGNOSIS — N898 Other specified noninflammatory disorders of vagina: Secondary | ICD-10-CM

## 2016-09-12 NOTE — Telephone Encounter (Signed)
Pharmacy requesting refill diflucan

## 2016-09-18 MED ORDER — FLUCONAZOLE 150 MG PO TABS: 150.0000 mg | ORAL_TABLET | Freq: Once | ORAL | 3 refills | Status: DC

## 2016-11-03 ENCOUNTER — Telehealth: Payer: Self-pay | Admitting: Internal Medicine

## 2016-11-03 NOTE — Telephone Encounter (Addendum)
Returned pt's call - stated right foot swollen; no pain; started over the w/e and only swollen on top. Stated she always keep it elevated. Feels heavy.  Suggested coming to [facility] but pt stated she wants to her doctor but appt not until July. Told her it's best to come in and have somone look at her foot for eval. Stated prefers to wait to see her doctor. Informed if condition worsens, to give [place] a call back;stated "ok".

## 2016-11-03 NOTE — Telephone Encounter (Signed)
Thank you Glenda.. I would recommend she come in as well. It would be impossible to tell what is going on with the foot until we see it.

## 2016-11-03 NOTE — Telephone Encounter (Signed)
Foot Swollen.  Offered appt. Patient declined wants to know what medications she can take instead.

## 2016-12-09 ENCOUNTER — Ambulatory Visit (INDEPENDENT_AMBULATORY_CARE_PROVIDER_SITE_OTHER): Payer: Medicare Other | Admitting: Internal Medicine

## 2016-12-09 VITALS — BP 126/76 | HR 82 | Temp 98.1°F | Wt 285.0 lb

## 2016-12-09 DIAGNOSIS — Z79899 Other long term (current) drug therapy: Secondary | ICD-10-CM

## 2016-12-09 DIAGNOSIS — Z7984 Long term (current) use of oral hypoglycemic drugs: Secondary | ICD-10-CM | POA: Diagnosis not present

## 2016-12-09 DIAGNOSIS — E1142 Type 2 diabetes mellitus with diabetic polyneuropathy: Secondary | ICD-10-CM

## 2016-12-09 DIAGNOSIS — Z6841 Body Mass Index (BMI) 40.0 and over, adult: Secondary | ICD-10-CM

## 2016-12-09 DIAGNOSIS — I10 Essential (primary) hypertension: Secondary | ICD-10-CM

## 2016-12-09 DIAGNOSIS — E113299 Type 2 diabetes mellitus with mild nonproliferative diabetic retinopathy without macular edema, unspecified eye: Secondary | ICD-10-CM

## 2016-12-09 LAB — POCT GLYCOSYLATED HEMOGLOBIN (HGB A1C): HEMOGLOBIN A1C: 8.1

## 2016-12-09 LAB — GLUCOSE, CAPILLARY: GLUCOSE-CAPILLARY: 179 mg/dL — AB (ref 65–99)

## 2016-12-09 MED ORDER — GLIPIZIDE ER 10 MG PO TB24: 10.0000 mg | ORAL_TABLET | Freq: Every day | ORAL | 2 refills | Status: DC

## 2016-12-09 NOTE — Progress Notes (Signed)
   CC: follow-up of type 2 diabetes & HTN  HPI:  [name] is a 66 y.o. F with medical history as outlined below who presents for follow-up evaluation of type 2 diabetes.   DM2: HbA1c 8.1% today, was 7.9% 06/2016. At this time her glipizide was increased to 10 mg daily however patient did not make this change. In addition, she reports she has not taken Januvia in quite some time due to price. She has been compliant with Metformin but notes a lot of dietary indiscretion lately.  Hypertension: on Lisinopril-HCTZ 20-25 mg, coreg 19mand amlodipine 10 mg. BP initially elevated but resolved on repeat check. Pt notes she was in a rush to make it to her appointment today.    Past Medical History:  Diagnosis Date  . DIABETES MELLITUS, TYPE II 03/17/2006       . HYPERLIPIDEMIA 03/17/2006   Qualifier: Diagnosis of  By: CSharlet SalinaMD, [place]    . HYPERTENSION 03/17/2006       . [place] 01/25/2008   Qualifier: Diagnosis of  By: Phifer MD, NIzora Gala    Review of Systems:   General: Denies fevers, chills, weight loss, fatigue HEENT: Denies changes in vision, sore throat, dysphagia Cardiac: Denies CP, SOB, palpitations Pulmonary: Denies cough, wheezes, PND Abd: Denies diarrhea, constipation, changes in bowels Extremities: Denies weakness or swelling  Physical Exam: General: Alert, in no acute distress. Pleasant and conversant. Obese.  HEENT: No icterus, injection or ptosis. No hoarseness or dysarthria  Cardiac: RRR, no MGR appreciated Pulmonary: CTA BL with normal WOB on RA. Able to speak in complete sentences Abd: Soft, non-tended. +bs Extremities: Warm, perfused. No significant pedal edema.     Vitals:   12/09/16 1456 12/09/16 1513  BP: (!) 155/79 126/76  Pulse: 84 82  Temp: 98.1 F (36.7 C)   TempSrc: Oral   SpO2: 100%   Weight: 285 lb (129.3 kg)     Assessment & Plan:   See Encounters Tab for problem based charting.  Patient discussed with Dr. NDareen Piano

## 2016-12-09 NOTE — Patient Instructions (Signed)
Sonya Lane, it was great seeing you today.   Today we talked about your diabetes. Your hemoglobin A1c was worse today at 8.1%. Your goal is <7.5%. Please continue taking Metformin 2063m daily and please increase your Glipizide to 10 mg daily. I have sent a new prescription to your pharmacy however you may take 2 5 mg pills until your bottle is out. It is important that you take this increased dose of the medication. It is also important that you work on your diet and exercise as well. Please limit the amount of carbohydrates you have during the day and do not eat 2 hours before bed.   We also talked about your flank pain. I feel there is a tight muscle there and believe this is contributing. I'm glad its getting better and I expect this to continue to improve with time. Please ask a trainer at the gym to ensure you have proper form with the machines.   Please come back in 3 months so we can see how you are doing on your medication regimen. Please ensure your medication list at home is identical to the one printed out today.

## 2016-12-10 LAB — CBC
HEMOGLOBIN: 12.8 g/dL (ref 11.1–15.9)
Hematocrit: 38.9 % (ref 34.0–46.6)
MCH: 27 pg (ref 26.6–33.0)
MCHC: 32.9 g/dL (ref 31.5–35.7)
MCV: 82 fL (ref 79–97)
Platelets: 240 10*3/uL (ref 150–379)
RBC: 4.74 x10E6/uL (ref 3.77–5.28)
RDW: 15.6 % — ABNORMAL HIGH (ref 12.3–15.4)
WBC: 7.3 10*3/uL (ref 3.4–10.8)

## 2016-12-10 LAB — BMP8+ANION GAP
ANION GAP: 17 mmol/L (ref 10.0–18.0)
BUN/Creatinine Ratio: 15 (ref 12–28)
BUN: 12 mg/dL (ref 8–27)
CALCIUM: 9.9 mg/dL (ref 8.7–10.3)
CO2: 28 mmol/L (ref 20–29)
CREATININE: 0.81 mg/dL (ref 0.57–1.00)
Chloride: 97 mmol/L (ref 96–106)
GFR, EST AFRICAN AMERICAN: 86 mL/min/{1.73_m2} (ref >59)
GFR, EST NON AFRICAN AMERICAN: 75 mL/min/{1.73_m2} (ref >59)
Glucose: 129 mg/dL — ABNORMAL HIGH (ref 65–99)
Potassium: 4 mmol/L (ref 3.5–5.2)
Sodium: 142 mmol/L (ref 134–144)

## 2016-12-10 LAB — TSH: TSH: 0.874 u[IU]/mL (ref 0.450–4.500)

## 2016-12-11 NOTE — Assessment & Plan Note (Addendum)
HbA1c 8.1% today. She did not increase Glipizide to 10 mg daily as instructed at last visit. In addition she admits to not taking Januvia. Will increase glipizide today and encouraged patients strict compliance. She is also to continue working on diet and exercise as well.  -Increase glipizide to 10 mg -Continue metformin 2045m daily -Recheck HbA1c 3 months, consider liraglutide if still

## 2016-12-12 NOTE — Assessment & Plan Note (Addendum)
BP initially elevated during visit however normalized upon recheck. SHe is on Lisinopril-HCTZ, coreg and amlodipine at home and notes compliance with all of these medications. Reviewed her refill history and she appears to be picking up these on time. Patient counseled on the importance of better BP control, especially in the setting of DM2. She admittedly does not monitor her sodium intake.  -Continue current medical management -Continue to monitor closely, low threshold to add additional agent (Consider Spironolactone) vs work-up for secondary causes -Check BMET to eval renal function

## 2016-12-12 NOTE — Assessment & Plan Note (Signed)
Ms. Gayle continues to exercise however has had dietary indiscretion lately. I encouraged her to increase her amount of aerobic exercise and to closely follow her diet. Recommended food diary and also suggested a trainer at the gym. She is open to both.  -Follow BMI -Continue to encourage healthy lifestyle modifications

## 2016-12-16 NOTE — Progress Notes (Signed)
Internal Medicine Clinic Attending  Case discussed with Dr. Danford Bad soon after the resident saw the patient.  We reviewed the resident's history and exam and pertinent patient test results.  I agree with the assessment, diagnosis, and plan of care documented in the resident's note.

## 2016-12-22 ENCOUNTER — Other Ambulatory Visit: Payer: Self-pay | Admitting: *Deleted

## 2016-12-22 DIAGNOSIS — E113299 Type 2 diabetes mellitus with mild nonproliferative diabetic retinopathy without macular edema, unspecified eye: Secondary | ICD-10-CM

## 2016-12-22 MED ORDER — GLIPIZIDE ER 10 MG PO TB24: 10.0000 mg | ORAL_TABLET | Freq: Every day | ORAL | 3 refills | Status: DC

## 2016-12-22 MED ORDER — METFORMIN HCL 1000 MG PO TABS: 1000.0000 mg | ORAL_TABLET | Freq: Two times a day (BID) | ORAL | 3 refills | Status: DC

## 2016-12-22 MED ORDER — GABAPENTIN 300 MG PO CAPS: 300.0000 mg | ORAL_CAPSULE | Freq: Three times a day (TID) | ORAL | 3 refills | Status: DC

## 2016-12-22 MED ORDER — CARVEDILOL 25 MG PO TABS: 25.0000 mg | ORAL_TABLET | Freq: Two times a day (BID) | ORAL | 3 refills | Status: DC

## 2016-12-22 MED ORDER — LISINOPRIL-HYDROCHLOROTHIAZIDE 20-25 MG PO TABS: 1.0000 | ORAL_TABLET | Freq: Every day | ORAL | 3 refills | Status: DC

## 2016-12-22 MED ORDER — AMLODIPINE BESYLATE 10 MG PO TABS: 10.0000 mg | ORAL_TABLET | Freq: Every day | ORAL | 3 refills | Status: DC

## 2017-01-01 ENCOUNTER — Other Ambulatory Visit: Payer: Self-pay | Admitting: Internal Medicine

## 2017-01-01 DIAGNOSIS — Z1231 Encounter for screening mammogram for malignant neoplasm of breast: Secondary | ICD-10-CM

## 2017-01-15 ENCOUNTER — Ambulatory Visit
Admission: RE | Admit: 2017-01-15 | Discharge: 2017-01-15 | Disposition: A | Discharge status: Home / Self Care | Payer: Medicare Other | Source: Ambulatory Visit | Attending: Internal Medicine | Admitting: Internal Medicine

## 2017-01-15 DIAGNOSIS — Z1231 Encounter for screening mammogram for malignant neoplasm of breast: Secondary | ICD-10-CM | POA: Diagnosis not present

## 2017-02-10 ENCOUNTER — Ambulatory Visit (INDEPENDENT_AMBULATORY_CARE_PROVIDER_SITE_OTHER): Payer: Medicare Other | Admitting: Internal Medicine

## 2017-02-10 VITALS — BP 152/78 | HR 73 | Temp 98.1°F | Ht 65.5 in | Wt 280.9 lb

## 2017-02-10 DIAGNOSIS — Z6841 Body Mass Index (BMI) 40.0 and over, adult: Secondary | ICD-10-CM

## 2017-02-10 DIAGNOSIS — R1011 Right upper quadrant pain: Secondary | ICD-10-CM | POA: Insufficient documentation

## 2017-02-10 DIAGNOSIS — K760 Fatty (change of) liver, not elsewhere classified: Secondary | ICD-10-CM

## 2017-02-10 NOTE — Progress Notes (Signed)
   CC: acute complaint of abdominal pain  HPI:  [name] is a 66 y.o. w/ Mhx as outlined below who presents for evaluation of abdominal pain. States shes had intermittent abdominal pain for several months however has worsened in intensity and frequency over the past 2 weeks. Indicates its located in the epigastrium and RUQ. Has been eating small bland meals for the past 2 weeks as she gets nausea with full meals. States she doesn't usually eat fatty foods like pizza or burgers as it upsets her stomach. Feels like she may have had a subjective fever today. No vomiting, changes in bMs, diarrhea, constipation, melena, hematochezia or dysuria. No prior hx of the same.   Past Medical History:  Diagnosis Date  . DIABETES MELLITUS, TYPE II 03/17/2006       . HYPERLIPIDEMIA 03/17/2006   Qualifier: Diagnosis of  By: Sharlet Salina MD, [place]    . HYPERTENSION 03/17/2006       . Morbid obesity ([place]) 01/25/2008   Qualifier: Diagnosis of  By: Phifer MD, Izora Gala     Review of Systems:   General: +fever. Denies weight loss HEENT: Denies changes in vision, sore throat, dysphagia Cardiac: Denies CP, SOB Pulmonary: Denies cough, wheezes Abd: +abdominal pain, + nausea. Denies changes in bowels Extremities: Denies weakness or swelling  Physical Exam: General: Alert, in no acute distress. Pleasant and conversant. Obese. HEENT: No icterus, injection or ptosis. No hoarseness or dysarthria  Cardiac: RRR, no MGR appreciated Pulmonary: CTA BL with normal WOB on RA. Able to speak in complete sentences Abd: Soft. +TTP epigastrum and RUQ. +Murphy sign? Extremities: Warm, perfused. No significant pedal edema.   Vitals:   02/10/17 1506  BP: (!) 152/78  Pulse: 73  Temp: 98.1 F (36.7 C)  TempSrc: Oral  SpO2: 100%  Weight: 280 lb 14.4 oz (127.4 kg)  Height: 5' 5.5" (1.664 m)   Assessment & Plan:   See Encounters Tab for problem based charting.  Patient discussed with Dr. Eppie Gibson

## 2017-02-10 NOTE — Assessment & Plan Note (Signed)
Assessment: Does have chronically elevated LFTs. [place] in 2009 with fatty liver disease. Hepatitis panel around this time negative.   Plan: CMET as below.

## 2017-02-10 NOTE — Assessment & Plan Note (Addendum)
Assessment: 66 y/o morbidly obese female here with worsening intermittent RUQ and epigastric abdominal pain + nausea. Syx present for several months however acutely worsened over past 2 weeks. On exam she appears comfortable except for palpation of RUQ. She is afebrile and not tachycardic. Abdomen is soft and not distended. Given her history, I am concerned about biliary colic. She is currently tolerating PO intake (small meals).   Plan: RUQ ultrasound scheduled. Small, low-fat meals. Gave strict precautions to present to ED (fever, chills, severe pain, etc). Have also ordered CMET and CBC -RTC 2-4 weeks however will discuss results with patient and formulate further plan based on above tests.

## 2017-02-10 NOTE — Patient Instructions (Addendum)
It was great seeing you today although I'm sorry you are in pain! Thank you for making an appointment to see me today.   I am concerned about your gallbladder. I am ordering a RUQ ultrasound as well as some lab work to see whats going on. I will call you with the results and make further plans based on them. I would like to see you back in 2 weeks to make sure things are improving.

## 2017-02-11 LAB — CMP14 + ANION GAP
ALK PHOS: 62 IU/L (ref 39–117)
ALT: 43 IU/L — AB (ref 0–32)
AST: 36 IU/L (ref 0–40)
Albumin/Globulin Ratio: 1.6 (ref 1.2–2.2)
Albumin: 4.4 g/dL (ref 3.6–4.8)
Anion Gap: 17 mmol/L (ref 10.0–18.0)
BILIRUBIN TOTAL: 0.4 mg/dL (ref 0.0–1.2)
BUN/Creatinine Ratio: 12 (ref 12–28)
BUN: 10 mg/dL (ref 8–27)
CO2: 27 mmol/L (ref 20–29)
Calcium: 10.4 mg/dL — ABNORMAL HIGH (ref 8.7–10.3)
Chloride: 95 mmol/L — ABNORMAL LOW (ref 96–106)
Creatinine, Ser: 0.84 mg/dL (ref 0.57–1.00)
GFR calc Af Amer: 83 mL/min/{1.73_m2} (ref >59)
GFR calc non Af Amer: 72 mL/min/{1.73_m2} (ref >59)
GLUCOSE: 152 mg/dL — AB (ref 65–99)
Globulin, Total: 2.8 g/dL (ref 1.5–4.5)
POTASSIUM: 4 mmol/L (ref 3.5–5.2)
Sodium: 139 mmol/L (ref 134–144)
Total Protein: 7.2 g/dL (ref 6.0–8.5)

## 2017-02-11 LAB — CBC
HEMOGLOBIN: 13.3 g/dL (ref 11.1–15.9)
Hematocrit: 40 % (ref 34.0–46.6)
MCH: 27.1 pg (ref 26.6–33.0)
MCHC: 33.3 g/dL (ref 31.5–35.7)
MCV: 82 fL (ref 79–97)
Platelets: 258 10*3/uL (ref 150–379)
RBC: 4.9 x10E6/uL (ref 3.77–5.28)
RDW: 15.6 % — ABNORMAL HIGH (ref 12.3–15.4)
WBC: 7.7 10*3/uL (ref 3.4–10.8)

## 2017-02-11 NOTE — Progress Notes (Signed)
Case discussed with Dr. Danford Bad at the time of the visit.  We reviewed the resident's history and exam and pertinent patient test results.  I agree with the assessment, diagnosis and plan of care documented in the resident's note.

## 2017-02-12 ENCOUNTER — Ambulatory Visit (HOSPITAL_COMMUNITY)
Admission: RE | Admit: 2017-02-12 | Discharge: 2017-02-12 | Disposition: A | Discharge status: Home / Self Care | Payer: Medicare Other | Source: Ambulatory Visit | Attending: Internal Medicine | Admitting: Internal Medicine

## 2017-02-12 DIAGNOSIS — R1011 Right upper quadrant pain: Secondary | ICD-10-CM | POA: Diagnosis not present

## 2017-02-16 ENCOUNTER — Telehealth: Payer: Self-pay

## 2017-02-16 NOTE — Telephone Encounter (Signed)
Requesting to speak with a nurse about test results. Please call pt back.

## 2017-02-17 NOTE — Telephone Encounter (Signed)
Sonya Lane.. Did you speak to her?

## 2017-02-19 NOTE — Telephone Encounter (Signed)
Requesting test results. Please call pt back.

## 2017-02-20 NOTE — Telephone Encounter (Signed)
Pt is calling back, requesting test results. Please call pt back.

## 2017-02-20 NOTE — Telephone Encounter (Signed)
I attempted to call, but no answer and no VM. If she calls back, please let her know labs looked fine, no significant abnormality. Ultrasound also looked normal, with no gallbladder disease. If her abdominal pain is persistent, we can re-evaluate her and consider further testing.

## 2017-02-25 ENCOUNTER — Ambulatory Visit (INDEPENDENT_AMBULATORY_CARE_PROVIDER_SITE_OTHER): Payer: Medicare Other | Admitting: Internal Medicine

## 2017-02-25 DIAGNOSIS — R1011 Right upper quadrant pain: Secondary | ICD-10-CM

## 2017-02-25 DIAGNOSIS — Z23 Encounter for immunization: Secondary | ICD-10-CM | POA: Diagnosis not present

## 2017-02-25 DIAGNOSIS — K5900 Constipation, unspecified: Secondary | ICD-10-CM | POA: Diagnosis not present

## 2017-02-25 DIAGNOSIS — Z0000 Encounter for general adult medical examination without abnormal findings: Secondary | ICD-10-CM

## 2017-02-25 NOTE — Telephone Encounter (Signed)
Pt stated she was seen today and informed of test/lab results.

## 2017-02-25 NOTE — Patient Instructions (Signed)
Sonya Lane,   I am happy you are feeling better.   Return to clinic if you have fevers, or chills associated with your abdominal pain.

## 2017-02-26 NOTE — Assessment & Plan Note (Signed)
Received flu vaccination 02/25/2017.

## 2017-02-26 NOTE — Progress Notes (Signed)
Medicine attending: I personally interviewed and briefly examined this patient on the day of the patient visit and reviewed pertinent clinical and laboratory data  with resident physician Dr. Rochele Pages and we discussed a management plan. No specific findings on exam or recent Abdominal ultrasound. Symptoms most likely musculoskeletal but she is at risk for gallbladder disease and is advised to call if symptoms recur.

## 2017-02-26 NOTE — Progress Notes (Signed)
   CC: abdominal pain  HPI:  Sonya Lane is a 66 y.o. with past medical history as documented below presenting with a chief of complaint of abdominal pain. She also wants to discuss abdominal ultrasound results. She was seen in clinic on 02/10/2017 for epigastric and RUQ abdominal pain that had acutely worsened. A RUQ ultrasound was performed and showed no gallbladder disease, gallstones, or  ductal dilation. There were no discrete liver masses, but but possible fatty liver was noted.   Today she states the abdominal pain has improved, but she is still having episodes. She will intermittently have the pain after coffee in the AM and dinner in the PM.The pain is localized in the epigastric area and her right flank. She describes it as a "bruise." She has been eating smaller, non fatty meals as suggested. She states the pain is no longer keeping her up at night like it was before. She does have bloating associated with the pain, but denies nausea, vomiting, or diarrhea. She does not take any over the counter antacids. She also frequently experiences constipation, which she has to take laxatives for. She cannot tell if the pain in her right flank is muscular, but she describes it as sore. She has no dysuria, urgency, or hematuria.   Past Medical History:  Diagnosis Date  . DIABETES MELLITUS, TYPE II 03/17/2006       . HYPERLIPIDEMIA 03/17/2006   Qualifier: Diagnosis of  By: Sharlet Salina MD, [place]    . HYPERTENSION 03/17/2006       . Morbid obesity ([place]) 01/25/2008   Qualifier: Diagnosis of  By: Phifer MD, Izora Gala     Review of Systems:   Review of Systems  Constitutional: Negative for chills, fever and weight loss.  HENT: Negative for congestion and sore throat.   Respiratory: Negative for cough and shortness of breath.   Cardiovascular: Negative for chest pain and leg swelling.  Gastrointestinal: Positive for abdominal pain and constipation. Negative for blood in stool, diarrhea, melena,  nausea and vomiting.  Genitourinary: Negative for dysuria, frequency, hematuria and urgency.  Musculoskeletal: Negative for myalgias.    Physical Exam:  Vitals:   02/25/17 1123  BP: 135/70  Pulse: 77  Temp: 98.1 F (36.7 C)  TempSrc: Oral  SpO2: 99%  Weight: 278 lb 12.8 oz (126.5 kg)  Height: 5' (1.524 m)   General: Sitting comfortably, NAD HEENT: [place]/AT, EOMI, no scleral icterus Cardiac: RRR, No R/M/G appreciated Pulm: normal effort, CTAB Abd: soft,non distended, mild TTP in the epigastrum, No CVA tenderness, BS normal Ext: extremities well perfused, no peripheral edema Neuro: alert and oriented X3, cranial nerves II-XII grossly intact   Assessment & Plan:   See Encounters Tab for problem based charting.  Patient seen with Dr. Beryle Beams

## 2017-02-26 NOTE — Assessment & Plan Note (Addendum)
Ultrasound results were unremarkable for gallstones or gallbladder disease and CMET was within normal limits, which was discussed with the patient. Patient states the pain has improved. The patient's age, gender, and weight increase her risk for gallstones so it is still a possibility she is experiencing intermittent cholelithiasis. Her epigastric pain and bloating may also be GERD or constipation. The pain has improved, which is reassuring, but if returns may need further imaging or endoscopy. The flank pain she is complaining of seems musculoskeletal in origin, she has no dysuria or urinary symptoms and is afebrile. At this time it is appropriate to continue to monitor the pain without any intervention. Discussed with the patient that if the pain returns and is associated with nausea, vomiting, or fevers she should be evaluated in the emergency department.

## 2017-03-30 DIAGNOSIS — H524 Presbyopia: Secondary | ICD-10-CM | POA: Diagnosis not present

## 2017-03-30 DIAGNOSIS — H2513 Age-related nuclear cataract, bilateral: Secondary | ICD-10-CM | POA: Diagnosis not present

## 2017-03-30 DIAGNOSIS — E119 Type 2 diabetes mellitus without complications: Secondary | ICD-10-CM | POA: Diagnosis not present

## 2017-03-30 DIAGNOSIS — H52203 Unspecified astigmatism, bilateral: Secondary | ICD-10-CM | POA: Diagnosis not present

## 2017-03-30 DIAGNOSIS — H5213 Myopia, bilateral: Secondary | ICD-10-CM | POA: Diagnosis not present

## 2017-04-07 ENCOUNTER — Encounter: Payer: Self-pay | Admitting: Internal Medicine

## 2017-04-07 ENCOUNTER — Encounter: Payer: Medicare Other | Admitting: Internal Medicine

## 2017-04-07 ENCOUNTER — Ambulatory Visit (INDEPENDENT_AMBULATORY_CARE_PROVIDER_SITE_OTHER): Payer: Medicare Other | Admitting: Internal Medicine

## 2017-04-07 VITALS — BP 133/68 | HR 79 | Temp 98.3°F | Ht 65.0 in | Wt 277.6 lb

## 2017-04-07 DIAGNOSIS — Z6841 Body Mass Index (BMI) 40.0 and over, adult: Secondary | ICD-10-CM

## 2017-04-07 DIAGNOSIS — E785 Hyperlipidemia, unspecified: Secondary | ICD-10-CM | POA: Diagnosis not present

## 2017-04-07 DIAGNOSIS — Z79899 Other long term (current) drug therapy: Secondary | ICD-10-CM

## 2017-04-07 DIAGNOSIS — H93231 Hyperacusis, right ear: Secondary | ICD-10-CM

## 2017-04-07 DIAGNOSIS — I10 Essential (primary) hypertension: Secondary | ICD-10-CM

## 2017-04-07 DIAGNOSIS — H93291 Other abnormal auditory perceptions, right ear: Secondary | ICD-10-CM

## 2017-04-07 DIAGNOSIS — H93239 Hyperacusis, unspecified ear: Secondary | ICD-10-CM | POA: Insufficient documentation

## 2017-04-07 DIAGNOSIS — Z7984 Long term (current) use of oral hypoglycemic drugs: Secondary | ICD-10-CM | POA: Diagnosis not present

## 2017-04-07 DIAGNOSIS — E1142 Type 2 diabetes mellitus with diabetic polyneuropathy: Secondary | ICD-10-CM | POA: Diagnosis not present

## 2017-04-07 LAB — GLUCOSE, CAPILLARY: Glucose-Capillary: 157 mg/dL — ABNORMAL HIGH (ref 65–99)

## 2017-04-07 LAB — POCT GLYCOSYLATED HEMOGLOBIN (HGB A1C): Hemoglobin A1C: 7.3

## 2017-04-07 NOTE — Assessment & Plan Note (Signed)
Patient describes 2 months of a humming sound in her ear which most predominantly happens when she lays down at night and wakes her up from sleep at times. She describes a rhythmic "whoosh" and believes that it is related to her heart beat. She denies associated palpitations, chest pain, difficulty breathing, neurologic changes or hearing changes. We discussed the possibilities of what this hearing disturbance could represent including early signs of hearing loss. She will monitor for new or worsening of her symptoms and contact the clinic if that occurs. If her symptoms do progress she should probably see ENT.

## 2017-04-07 NOTE — Patient Instructions (Signed)
It was a pleasure to see you today Sonya Lane - For your high blood pressure, keep going to the gym and working on weight loss and low-salt diet.  Continue to take your blood pressure medicines amlodipine and lisinopril/HCTZ.  - For the sound in your ear -please call the clinic if this gets worse or becomes associated with any symptoms such as changes in your hearing, weakness, or drooping of your face. - Please call our clinic if you have any problems or questions, we may be able to help you and keep you from a long emergency room wait. Our clinic and after hours phone number is [phone]        DASH Eating Plan Sonya Lane stands for "Dietary Approaches to Stop Hypertension." The DASH eating plan is a healthy eating plan that has been shown to reduce high blood pressure (hypertension). It may also reduce your risk for type 2 diabetes, heart disease, and stroke. The DASH eating plan may also help with weight loss. What are tips for following this plan? General guidelines  Avoid eating more than 2,300 mg (milligrams) of salt (sodium) a day. If you have hypertension, you may need to reduce your sodium intake to 1,500 mg a day.  Limit alcohol intake to no more than 1 drink a day for nonpregnant women and 2 drinks a day for men. One drink equals 12 oz of beer, 5 oz of wine, or 1 oz of hard liquor.  Work with your health care provider to maintain a healthy body weight or to lose weight. Ask what an ideal weight is for you.  Get at least 30 minutes of exercise that causes your heart to beat faster (aerobic exercise) most days of the week. Activities may include walking, swimming, or biking.  Work with your health care provider or diet and nutrition specialist (dietitian) to adjust your eating plan to your individual calorie needs. Reading food labels  Check food labels for the amount of sodium per serving. Choose foods with less than 5 percent of the Daily Value of sodium. Generally, foods with less  than 300 mg of sodium per serving fit into this eating plan.  To find whole grains, look for the word "whole" as the first word in the ingredient list. Shopping  Buy products labeled as "low-sodium" or "no salt added."  Buy fresh foods. Avoid canned foods and premade or frozen meals. Cooking  Avoid adding salt when cooking. Use salt-free seasonings or herbs instead of table salt or sea salt. Check with your health care provider or pharmacist before using salt substitutes.  Do not fry foods. Cook foods using healthy methods such as baking, boiling, grilling, and broiling instead.  Cook with heart-healthy oils, such as Sonya, canola, soybean, or sunflower oil. Meal planning   Eat a balanced diet that includes: ? 5 or more servings of fruits and vegetables each day. At each meal, try to fill half of your plate with fruits and vegetables. ? Up to 6-8 servings of whole grains each day. ? Less than 6 oz of lean meat, poultry, or fish each day. A 3-oz serving of meat is about the same size as a deck of cards. One egg equals 1 oz. ? 2 servings of low-fat dairy each day. ? A serving of nuts, seeds, or beans 5 times each week. ? Heart-healthy fats. Healthy fats called Omega-3 fatty acids are found in foods such as flaxseeds and coldwater fish, like sardines, salmon, and mackerel.  Limit how much  you eat of the following: ? Canned or prepackaged foods. ? Food that is high in trans fat, such as fried foods. ? Food that is high in saturated fat, such as fatty meat. ? Sweets, desserts, sugary drinks, and other foods with added sugar. ? Full-fat dairy products.  Do not salt foods before eating.  Try to eat at least 2 vegetarian meals each week.  Eat more home-cooked food and less restaurant, buffet, and fast food.  When eating at a restaurant, ask that your food be prepared with less salt or no salt, if possible. What foods are recommended? The items listed may not be a complete list. Talk  with your dietitian about what dietary choices are best for you. Grains Whole-grain or whole-wheat bread. Whole-grain or whole-wheat pasta. Brown rice. Sonya Lane. Bulgur. Whole-grain and low-sodium cereals. Pita bread. Low-fat, low-sodium crackers. Whole-wheat flour tortillas. Vegetables Fresh or frozen vegetables (raw, steamed, roasted, or grilled). Low-sodium or reduced-sodium tomato and vegetable juice. Low-sodium or reduced-sodium tomato sauce and tomato paste. Low-sodium or reduced-sodium canned vegetables. Fruits All fresh, dried, or frozen fruit. Canned fruit in natural juice (without added sugar). Meat and other protein foods Skinless chicken or [place]. Ground chicken or [place]. Pork with fat trimmed off. Fish and seafood. Egg whites. Dried beans, peas, or lentils. Unsalted nuts, nut butters, and seeds. Unsalted canned beans. Lean cuts of beef with fat trimmed off. Low-sodium, lean deli meat. Dairy Low-fat (1%) or fat-free (skim) milk. Fat-free, low-fat, or reduced-fat cheeses. Nonfat, low-sodium ricotta or cottage cheese. Low-fat or nonfat yogurt. Low-fat, low-sodium cheese. Fats and oils Soft margarine without trans fats. Vegetable oil. Low-fat, reduced-fat, or light mayonnaise and salad dressings (reduced-sodium). Canola, safflower, Sonya, soybean, and sunflower oils. Avocado. Seasoning and other foods Herbs. Spices. Seasoning mixes without salt. Unsalted popcorn and pretzels. Fat-free sweets. What foods are not recommended? The items listed may not be a complete list. Talk with your dietitian about what dietary choices are best for you. Grains Baked goods made with fat, such as croissants, muffins, or some breads. Dry pasta or rice meal packs. Vegetables Creamed or fried vegetables. Vegetables in a cheese sauce. Regular canned vegetables (not low-sodium or reduced-sodium). Regular canned tomato sauce and paste (not low-sodium or reduced-sodium). Regular tomato and vegetable  juice (not low-sodium or reduced-sodium). Sonya Lane. Olives. Fruits Canned fruit in a light or heavy syrup. Fried fruit. Fruit in cream or butter sauce. Meat and other protein foods Fatty cuts of meat. Ribs. Fried meat. Berniece Salines. Sausage. Bologna and other processed lunch meats. Salami. Fatback. Hotdogs. Bratwurst. Salted nuts and seeds. Canned beans with added salt. Canned or smoked fish. Whole eggs or egg yolks. Chicken or [place] with skin. Dairy Whole or 2% milk, cream, and half-and-half. Whole or full-fat cream cheese. Whole-fat or sweetened yogurt. Full-fat cheese. Nondairy creamers. Whipped toppings. Processed cheese and cheese spreads. Fats and oils Butter. Stick margarine. Lard. Shortening. Ghee. Bacon fat. Tropical oils, such as coconut, palm kernel, or palm oil. Seasoning and other foods Salted popcorn and pretzels. Onion salt, garlic salt, seasoned salt, table salt, and sea salt. Worcestershire sauce. Tartar sauce. Barbecue sauce. Teriyaki sauce. Soy sauce, including reduced-sodium. Steak sauce. Canned and packaged gravies. Fish sauce. Oyster sauce. Cocktail sauce. Horseradish that you find on the shelf. Ketchup. Mustard. Meat flavorings and tenderizers. Bouillon cubes. Hot sauce and Tabasco sauce. Premade or packaged marinades. Premade or packaged taco seasonings. Relishes. Regular salad dressings. Where to find more information:  National Heart, Lung, and Blood Institute: https://wilson-eaton.com/  American Heart Association: www.heart.org Summary  The DASH eating plan is a healthy eating plan that has been shown to reduce high blood pressure (hypertension). It may also reduce your risk for type 2 diabetes, heart disease, and stroke.  With the DASH eating plan, you should limit salt (sodium) intake to 2,300 mg a day. If you have hypertension, you may need to reduce your sodium intake to 1,500 mg a day.  When on the DASH eating plan, aim to eat more fresh fruits and vegetables, whole grains,  lean proteins, low-fat dairy, and heart-healthy fats.  Work with your health care provider or diet and nutrition specialist (dietitian) to adjust your eating plan to your individual calorie needs. This information is not intended to replace advice given to you by your health care provider. Make sure you discuss any questions you have with your health care provider. Document Released: 05/08/2011 Document Revised: 05/12/2016 Document Reviewed: 05/12/2016 Elsevier Interactive Patient Education  2017 [employer].

## 2017-04-07 NOTE — Assessment & Plan Note (Signed)
BP Readings from Last 3 Encounters:  04/07/17 133/68  02/25/17 135/70  02/10/17 (!) 152/78  Blood pressure well controlled today. She denies side effects of current medications.  - continue amlodipine 10 mg daily, lisinopril - HCTZ 20-25 mg daily and carvedilol 25 mg BID

## 2017-04-07 NOTE — Progress Notes (Signed)
   CC: acute concern for humming sound in her ear   HPI:  Sonya Lane is a 66 y.o. with PMH hypertension, hyperlipidemia, type 2 diabetes with peripheral neuropathy, morbid obesity who presents for follow up of diabetes and hypertension acute concern of a humming in her ear. Please see the assessment and plans for the status of the patient chronic medical problems.   Past Medical History:  Diagnosis Date  . DIABETES MELLITUS, TYPE II 03/17/2006       . HYPERLIPIDEMIA 03/17/2006   Qualifier: Diagnosis of  By: Sharlet Salina MD, [place]    . HYPERTENSION 03/17/2006       . Morbid obesity ([place]) 01/25/2008   Qualifier: Diagnosis of  By: Phifer MD, Izora Gala     Review of Systems: Refer to history of present illness and assessment and plans for pertinent review of systems, all others reviewed and negative  Physical Exam:  Vitals:   04/07/17 1450  BP: 133/68  Pulse: 79  Temp: 98.3 F (36.8 C)  TempSrc: Oral  SpO2: 99%  Weight: 277 lb 9.6 oz (125.9 kg)  Height: 5' 5"  (1.651 m)   General well-appearing, no acute distress, well groomed HEENT right ear without tenderness with manipulation of the trigus, no erythema of the auditory canal, cone of light visible over the tympanic membrane, no perforation, erythema, or effusion Neuro: hearing of soft sounds intact and equal bilateral  Cardiac regular rate and rhythm, no murmurs, no peripheral edema Pulmonary lungs clear to auscultation bilateral  Assessment & Plan:   Humming in ears  Patient describes 2 months of a humming sound in her ear which most predominantly happens when she lays down at night and wakes her up from sleep at times. She describes a rhythmic "whoosh" and believes that it is related to her heart beat. She denies associated palpitations, chest pain, difficulty breathing, neurologic changes or hearing changes. We discussed the possibilities of what this hearing disturbance could represent including early signs of hearing  loss. She will monitor for new or worsening of her symptoms and contact the clinic if that occurs. If her symptoms do progress she should probably see ENT.   Hypertension  BP Readings from Last 3 Encounters:  04/07/17 133/68  02/25/17 135/70  02/10/17 (!) 152/78  Blood pressure well controlled today. She denies side effects of current medications.  - continue amlodipine 10 mg daily, lisinopril - HCTZ 20-25 mg daily and carvedilol 25 mg BID  Type 2 Diabetes Mellitus with peripheral edema  A1c 7.3 today, this is improved from 8.1 at last check. Denies symptomatic hypoglycemia, diarrhea or GI upset.  - Continue glipizide 10 mg daily and metformin 1000 mg BID  - return to clinic for follow up A1c in 3 months    See Encounters Tab for problem based charting.  Patient discussed with Dr. Evette Doffing

## 2017-04-07 NOTE — Assessment & Plan Note (Signed)
A1c 7.3 today, this is improved from 8.1 at last check. Denies symptomatic hypoglycemia, diarrhea or GI upset.  - Continue glipizide 10 mg daily and metformin 100 mg BID  - return to clinic for follow up A1c in 3 months

## 2017-04-08 NOTE — Progress Notes (Signed)
Internal Medicine Clinic Attending  Case discussed with Dr. Blum at the time of the visit.  We reviewed the resident's history and exam and pertinent patient test results.  I agree with the assessment, diagnosis, and plan of care documented in the resident's note. 

## 2017-04-28 ENCOUNTER — Encounter: Payer: Medicare Other | Admitting: Internal Medicine

## 2017-07-14 ENCOUNTER — Ambulatory Visit (INDEPENDENT_AMBULATORY_CARE_PROVIDER_SITE_OTHER): Payer: Medicare Other | Admitting: Internal Medicine

## 2017-07-14 ENCOUNTER — Encounter: Payer: Self-pay | Admitting: Internal Medicine

## 2017-07-14 ENCOUNTER — Other Ambulatory Visit: Payer: Self-pay

## 2017-07-14 VITALS — BP 138/76 | HR 72 | Temp 98.1°F | Ht 65.0 in | Wt 280.2 lb

## 2017-07-14 DIAGNOSIS — E1142 Type 2 diabetes mellitus with diabetic polyneuropathy: Secondary | ICD-10-CM | POA: Diagnosis not present

## 2017-07-14 DIAGNOSIS — Z7984 Long term (current) use of oral hypoglycemic drugs: Secondary | ICD-10-CM | POA: Diagnosis not present

## 2017-07-14 DIAGNOSIS — E785 Hyperlipidemia, unspecified: Secondary | ICD-10-CM

## 2017-07-14 DIAGNOSIS — I10 Essential (primary) hypertension: Secondary | ICD-10-CM

## 2017-07-14 DIAGNOSIS — E782 Mixed hyperlipidemia: Secondary | ICD-10-CM

## 2017-07-14 DIAGNOSIS — K589 Irritable bowel syndrome without diarrhea: Secondary | ICD-10-CM | POA: Insufficient documentation

## 2017-07-14 DIAGNOSIS — Z6841 Body Mass Index (BMI) 40.0 and over, adult: Secondary | ICD-10-CM | POA: Diagnosis not present

## 2017-07-14 DIAGNOSIS — Z79899 Other long term (current) drug therapy: Secondary | ICD-10-CM | POA: Diagnosis not present

## 2017-07-14 DIAGNOSIS — E113299 Type 2 diabetes mellitus with mild nonproliferative diabetic retinopathy without macular edema, unspecified eye: Secondary | ICD-10-CM

## 2017-07-14 DIAGNOSIS — K582 Mixed irritable bowel syndrome: Secondary | ICD-10-CM | POA: Diagnosis not present

## 2017-07-14 LAB — POCT GLYCOSYLATED HEMOGLOBIN (HGB A1C): HEMOGLOBIN A1C: 7.4

## 2017-07-14 LAB — GLUCOSE, CAPILLARY: GLUCOSE-CAPILLARY: 146 mg/dL — AB (ref 65–99)

## 2017-07-14 MED ORDER — ATORVASTATIN CALCIUM 20 MG PO TABS: 20.0000 mg | ORAL_TABLET | Freq: Every day | ORAL | 0 refills | Status: DC

## 2017-07-14 MED ORDER — METFORMIN HCL 1000 MG PO TABS: 1000.0000 mg | ORAL_TABLET | Freq: Two times a day (BID) | ORAL | 3 refills | Status: DC

## 2017-07-14 MED ORDER — LISINOPRIL-HYDROCHLOROTHIAZIDE 20-25 MG PO TABS: 1.0000 | ORAL_TABLET | Freq: Every day | ORAL | 3 refills | Status: DC

## 2017-07-14 MED ORDER — GLYCERIN (ADULT) 2 G RE SUPP: 1.0000 | RECTAL | 2 refills | Status: DC | PRN

## 2017-07-14 MED ORDER — GABAPENTIN 300 MG PO CAPS: 300.0000 mg | ORAL_CAPSULE | Freq: Three times a day (TID) | ORAL | 3 refills | Status: DC

## 2017-07-14 MED ORDER — GLIPIZIDE ER 10 MG PO TB24: 10.0000 mg | ORAL_TABLET | Freq: Every day | ORAL | 3 refills | Status: DC

## 2017-07-14 MED ORDER — CARVEDILOL 25 MG PO TABS: 25.0000 mg | ORAL_TABLET | Freq: Two times a day (BID) | ORAL | 3 refills | Status: DC

## 2017-07-14 MED ORDER — AMLODIPINE BESYLATE 10 MG PO TABS: 10.0000 mg | ORAL_TABLET | Freq: Every day | ORAL | 3 refills | Status: DC

## 2017-07-14 NOTE — Progress Notes (Signed)
   CC: follow-up of DM2, HTN, IBS  HPI:  [name] is a 67 y.o. F with type 2 diabetes, HTN, HLD, IBS and morbid obesity here for follow-up of the above conditions. She has no acute complaints although did talk in detail today about IBS. Please see problem-based charting for details and plans regarding her chronic medical issues.   Past Medical History:  Diagnosis Date  . DIABETES MELLITUS, TYPE II 03/17/2006       . HYPERLIPIDEMIA 03/17/2006   Qualifier: Diagnosis of  By: Sharlet Salina MD, [place]    . HYPERTENSION 03/17/2006       . Morbid obesity ([place]) 01/25/2008   Qualifier: Diagnosis of  By: Phifer MD, Izora Gala     Review of Systems:   General: Denies fevers, chills, weight loss, fatigue HEENT: Denies changes in vision, dysphagia, heartburn Cardiac: Denies CP, SOB, palpitations Pulmonary: Denies cough, wheezes, PND Abd: +diarrhea, +constipation. +cramping. Denies blood or melena.  Extremities: Denies weakness or swelling  Physical Exam: General: Alert, in no acute distress. Pleasant and conversant HEENT: No icterus, injection or ptosis. No hoarseness or dysarthria  Cardiac: RRR, no MGR appreciated Pulmonary: CTA BL with normal WOB on RA. Able to speak in complete sentences Abd: Soft, non-tender. Hypoactive bowel sounds. Extremities: Warm, perfused. No significant pedal edema.   Vitals:   07/14/17 1531 07/14/17 1546  BP: (!) 144/70 138/76  Pulse: 72   Temp: 98.1 F (36.7 C)   TempSrc: Oral   SpO2: 98%   Weight: 280 lb 3.2 oz (127.1 kg)   Height: 5' 5"  (1.651 m)    Assessment & Plan:   See Encounters Tab for problem based charting.  Patient discussed with Dr. Lynnae January

## 2017-07-14 NOTE — Assessment & Plan Note (Signed)
Patient with uncontrolled hyperlipidemia. She was previously on trial of Pravastatin 76m TWICE WEEKLY however developed myalgias with this. We discussed importance of lipid control as it relates to risk of MI, CVA, etc. She is agreeable to trial of Atorvastatin today.  -Atorvastatin 472msent to pharmacy -Could consider transition of Glipizide to flozin if not able to tolerate

## 2017-07-14 NOTE — Patient Instructions (Addendum)
It was good seeing you today! I'm glad you are doing OK  Your hemoglobin A1c was 7.4% indicating EXCELLENT control! Keep up the good work!  Today we talked about Irritable Bowel Syndrome. Unfortunately this is a difficult condition to manage, especially those with alternating diarrhea and constipation. Please keep up your current regimen.   Irritable Bowel Syndrome, Adult Irritable bowel syndrome (IBS) is not one specific disease. It is a group of symptoms that affects the organs responsible for digestion (gastrointestinal or GI tract). To regulate how your GI tract works, your body sends signals back and forth between your intestines and your brain. If you have IBS, there may be a problem with these signals. As a result, your GI tract does not function normally. Your intestines may become more sensitive and overreact to certain things. This is especially true when you eat certain foods or when you are under stress. There are four types of IBS. These may be determined based on the consistency of your stool:  IBS with diarrhea.  IBS with constipation.  Mixed IBS.  Unsubtyped IBS.  It is important to know which type of IBS you have. Some treatments are more likely to be helpful for certain types of IBS. What are the causes? The exact cause of IBS is not known. What increases the risk? You may have a higher risk of IBS if:  You are a woman.  You are younger than 69 years old.  You have a family history of IBS.  You have mental health problems.  You have had bacterial infection of your GI tract.  What are the signs or symptoms? Symptoms of IBS vary from person to person. The main symptom is abdominal pain or discomfort. Additional symptoms usually include one or more of the following:  Diarrhea, constipation, or both.  Abdominal swelling or bloating.  Feeling full or sick after eating a small or regular-size meal.  Frequent gas.  Mucus in the stool.  A feeling of having  more stool left after a bowel movement.  Symptoms tend to come and go. They may be associated with stress, psychiatric conditions, or nothing at all. How is this diagnosed? There is no specific test to diagnose IBS. Your health care provider will make a diagnosis based on a physical exam, medical history, and your symptoms. You may have other tests to rule out other conditions that may be causing your symptoms. These may include:  Blood tests.  X-rays.  CT scan.  Endoscopy and colonoscopy. This is a test in which your GI tract is viewed with a long, thin, flexible tube.  How is this treated? There is no cure for IBS, but treatment can help relieve symptoms. IBS treatment often includes:  Changes to your diet, such as: ? Eating more fiber. ? Avoiding foods that cause symptoms. ? Drinking more water. ? Eating regular, medium-sized portioned meals.  Medicines. These may include: ? Fiber supplements if you have constipation. ? Medicine to control diarrhea (antidiarrheal medicines). ? Medicine to help control muscle spasms in your GI tract (antispasmodic medicines). ? Medicines to help with any mental health issues, such as antidepressants or tranquilizers.  Therapy. ? Talk therapy may help with anxiety, depression, or other mental health issues that can make IBS symptoms worse.  Stress reduction. ? Managing your stress can help keep symptoms under control.  Follow these instructions at home:  Take medicines only as directed by your health care provider.  Eat a healthy diet. ? Avoid foods and  drinks with added sugar. ? Include more whole grains, fruits, and vegetables gradually into your diet. This may be especially helpful if you have IBS with constipation. ? Avoid any foods and drinks that make your symptoms worse. These may include dairy products and caffeinated or carbonated drinks. ? Do not eat large meals. ? Drink enough fluid to keep your urine clear or pale  yellow.  Exercise regularly. Ask your health care provider for recommendations of good activities for you.  Keep all follow-up visits as directed by your health care provider. This is important.  Diet for Irritable Bowel Syndrome When you have irritable bowel syndrome (IBS), the foods you eat and your eating habits are very important. IBS may cause various symptoms, such as abdominal pain, constipation, or diarrhea. Choosing the right foods can help ease discomfort caused by these symptoms. Work with your health care provider and dietitian to find the best eating plan to help control your symptoms. What general guidelines do I need to follow?  Keep a food diary. This will help you identify foods that cause symptoms. Write down: ? What you eat and when. ? What symptoms you have. ? When symptoms occur in relation to your meals.  Avoid foods that cause symptoms. Talk with your dietitian about other ways to get the same nutrients that are in these foods.  Eat more foods that contain fiber. Take a fiber supplement if directed by your dietitian.  Eat your meals slowly, in a relaxed setting.  Aim to eat 5-6 small meals per day. Do not skip meals.  Drink enough fluids to keep your urine clear or pale yellow.  Ask your health care provider if you should take an over-the-counter probiotic during flare-ups to help restore healthy gut bacteria.  If you have cramping or diarrhea, try making your meals low in fat and high in carbohydrates. Examples of carbohydrates are pasta, rice, whole grain breads and cereals, fruits, and vegetables.  If dairy products cause your symptoms to flare up, try eating less of them. You might be able to handle yogurt better than other dairy products because it contains bacteria that help with digestion. What foods are not recommended? The following are some foods and drinks that may worsen your symptoms:  Fatty foods, such as [place] fries.  Milk products, such as  cheese or ice cream.  Chocolate.  Alcohol.  Products with caffeine, such as coffee.  Carbonated drinks, such as soda.  The items listed above may not be a complete list of foods and beverages to avoid. Contact your dietitian for more information. What foods are good sources of fiber? Your health care provider or dietitian may recommend that you eat more foods that contain fiber. Fiber can help reduce constipation and other IBS symptoms. Add foods with fiber to your diet a little at a time so that your body can get used to them. Too much fiber at once might cause gas and swelling of your abdomen. The following are some foods that are good sources of fiber:  Apples.  Peaches.  Pears.  Berries.  Figs.  Broccoli (raw).  Cabbage.  Carrots.  Raw peas.  Kidney beans.  Lima beans.  Whole grain bread.  Whole grain cereal.  Where to find more information: [employer] for Functional Gastrointestinal Disorders: www.iffgd.[employer] of Diabetes and Digestive and Kidney Diseases: NetworkAffair.co.za.aspx This information is not intended to replace advice given to you by your health care provider. Make sure you discuss any questions you  have with your health care provider. Document Released: 08/09/2003 Document Revised: 10/25/2015 Document Reviewed: 08/19/2013 Elsevier Interactive Patient Education  2018 [employer].

## 2017-07-14 NOTE — Assessment & Plan Note (Signed)
Alternating diarrhea, constipation, cramping and abdominal discomfort relieved with defecation have been complaints for several years. Had colonoscopy in 2016 which was essentially normal and symptoms have not changed significantly since then. Symptoms are worse during times of stress although hasnt noted any foods that seem to be associated with worse symptoms. She takes Imodium only rarely as she feels it makes her constipation worse than it usually is. She alternates taking prune juice, Miralax and Docusate with some improvement. She feels the majority of her issue is inability to relax her external anal sphincter and often has to stimulate the area or insert digit in order to move her bowels. She denies any hematochezia, melena, nighttime awakenings, weight loss or family history of similar symptoms.  -Provided patient educational information on IBS -Continue supportive care with PRN Imodium and regular Miralax although did suggest trial of Senakot-S on her alternating days -Discussed relaxation techniques for rectum including mindfulness and did discuss PT however patient not interested presently. -Glycerin suppositories for now

## 2017-07-14 NOTE — Assessment & Plan Note (Signed)
A1c 7.4%! She is tolerating Metformin 209m and Glipizide 146mdaily. She is working on diet and exercise and has tried to cut out "the good stuff." Congratulated and encouraged patient to continue her good work.  -Continue Metformin and Glipizide -Repeat A1c 3 months

## 2017-07-14 NOTE — Assessment & Plan Note (Signed)
BP controlled today on recheck. She is tolerating her current medications without issue. She watches her sodium intake.  -Continue Amlodipine 61m, Lisinopril-HCTZ 20-286mand Coreg 252mID

## 2017-07-15 NOTE — Progress Notes (Signed)
Internal Medicine Clinic Attending  Case discussed with Dr. Molt at the time of the visit.  We reviewed the resident's history and exam and pertinent patient test results.  I agree with the assessment, diagnosis, and plan of care documented in the resident's note. 

## 2017-07-17 ENCOUNTER — Telehealth: Payer: Self-pay | Admitting: *Deleted

## 2017-07-17 ENCOUNTER — Other Ambulatory Visit: Payer: Self-pay | Admitting: Internal Medicine

## 2017-07-17 DIAGNOSIS — K582 Mixed irritable bowel syndrome: Secondary | ICD-10-CM

## 2017-07-17 MED ORDER — GLYCERIN (ADULT) 2 G RE SUPP: 1.0000 | Freq: Every day | RECTAL | 0 refills | Status: DC | PRN

## 2017-07-17 NOTE — Telephone Encounter (Signed)
Fax from [facility] - clarify frequency of administration for Glycerin 2 g suppository ( QD PRN or BID PRN , etc ). Please send new rx . Thanks

## 2017-07-20 NOTE — Telephone Encounter (Signed)
Rx re-send per Dr Danford Bad.

## 2017-07-23 ENCOUNTER — Telehealth: Payer: Self-pay | Admitting: Internal Medicine

## 2017-07-23 NOTE — Telephone Encounter (Signed)
Pt called and states she did not know that you were ordering cholesterol med for her, she states she wants to be sure she should be taking the med. Read your visit note, reassured her that you are concerned with past lab value and this is a trial, she would like for for you to call her. Please let me know that you have spoken to her

## 2017-07-23 NOTE — Telephone Encounter (Signed)
Patient received pills they came unsure what medicine some are

## 2017-07-24 NOTE — Telephone Encounter (Signed)
Discussed with Mrs. Kamer. She will take the trial of medication.  Thanks! Have a great weekend!

## 2017-09-01 ENCOUNTER — Other Ambulatory Visit: Payer: Self-pay | Admitting: Internal Medicine

## 2017-09-01 DIAGNOSIS — E782 Mixed hyperlipidemia: Secondary | ICD-10-CM

## 2017-11-17 ENCOUNTER — Other Ambulatory Visit: Payer: Self-pay | Admitting: Internal Medicine

## 2017-11-17 DIAGNOSIS — N898 Other specified noninflammatory disorders of vagina: Secondary | ICD-10-CM

## 2017-11-18 NOTE — Telephone Encounter (Signed)
Received fax from [facility] requesting refill on nystatin cream. Not on current med list. Please advise. Hubbard Hartshorn, RN, BSN

## 2017-11-25 ENCOUNTER — Telehealth: Payer: Self-pay | Admitting: *Deleted

## 2017-11-25 NOTE — Telephone Encounter (Signed)
Received fax from [facility] requesting refill on nystatin cream. Not on current med list. Please advise. Hubbard Hartshorn, RN, BSN

## 2017-11-27 ENCOUNTER — Other Ambulatory Visit: Payer: Self-pay | Admitting: Internal Medicine

## 2017-11-27 MED ORDER — NYSTATIN 100000 UNIT/GM EX POWD: Freq: Four times a day (QID) | CUTANEOUS | 0 refills | Status: DC

## 2017-11-27 MED ORDER — NYSTATIN 100000 UNIT/GM EX CREA: 1.0000 "application " | TOPICAL_CREAM | Freq: Two times a day (BID) | CUTANEOUS | 0 refills | Status: DC

## 2017-11-27 NOTE — Telephone Encounter (Signed)
Rx sent. Thanx

## 2017-12-08 ENCOUNTER — Other Ambulatory Visit: Payer: Self-pay

## 2017-12-08 ENCOUNTER — Ambulatory Visit (INDEPENDENT_AMBULATORY_CARE_PROVIDER_SITE_OTHER): Payer: Medicare Other | Admitting: Internal Medicine

## 2017-12-08 ENCOUNTER — Encounter: Payer: Self-pay | Admitting: Internal Medicine

## 2017-12-08 VITALS — BP 119/61 | HR 80 | Temp 98.1°F | Ht 65.0 in | Wt 284.9 lb

## 2017-12-08 DIAGNOSIS — M21371 Foot drop, right foot: Secondary | ICD-10-CM | POA: Diagnosis not present

## 2017-12-08 DIAGNOSIS — Z7984 Long term (current) use of oral hypoglycemic drugs: Secondary | ICD-10-CM

## 2017-12-08 DIAGNOSIS — K582 Mixed irritable bowel syndrome: Secondary | ICD-10-CM | POA: Diagnosis not present

## 2017-12-08 DIAGNOSIS — I10 Essential (primary) hypertension: Secondary | ICD-10-CM

## 2017-12-08 DIAGNOSIS — E1142 Type 2 diabetes mellitus with diabetic polyneuropathy: Secondary | ICD-10-CM

## 2017-12-08 DIAGNOSIS — E2839 Other primary ovarian failure: Secondary | ICD-10-CM

## 2017-12-08 DIAGNOSIS — Z79899 Other long term (current) drug therapy: Secondary | ICD-10-CM

## 2017-12-08 DIAGNOSIS — Z9119 Patient's noncompliance with other medical treatment and regimen: Secondary | ICD-10-CM

## 2017-12-08 LAB — POCT GLYCOSYLATED HEMOGLOBIN (HGB A1C): Hemoglobin A1C: 8.1 % — AB (ref 4.0–5.6)

## 2017-12-08 LAB — GLUCOSE, CAPILLARY: GLUCOSE-CAPILLARY: 166 mg/dL — AB (ref 70–99)

## 2017-12-08 NOTE — Progress Notes (Addendum)
   CC: follow-up of DM2, HTN  HPI:  [name] is a 67 y.o. F with medical history as outlined below who presents today for follow-up of diabetes, HTN and peripheral neuropathy. She also mentions feeling like her right foot drags sometimes.  For details regarding today's visit and the status of their chronic medical issues, please refer to the assessment and plan.  Past Medical History:  Diagnosis Date  . DIABETES MELLITUS, TYPE II 03/17/2006       . HYPERLIPIDEMIA 03/17/2006   Qualifier: Diagnosis of  By: Sharlet Salina MD, [place]    . HYPERTENSION 03/17/2006       . Morbid obesity ([place]) 01/25/2008   Qualifier: Diagnosis of  By: Phifer MD, Izora Gala     Review of Systems:   General: Denies fevers, chills, weight loss, fatigue HEENT: Denies acute changes in vision, sore throat, dysphagia Cardiac: Denies CP, SOB, palpitations Pulmonary: Denies cough, wheezes, PND Abd: Denies abdominal pain, changes in bowels Extremities: Denies weakness or swelling  Physical Exam: General: Morbidly obese. Alert, in no acute distress. Pleasant and conversant HEENT: No icterus, injection or ptosis. No hoarseness or dysarthria  Cardiac: RRR, no MGR appreciated Pulmonary: CTA BL with normal WOB on RA. Able to speak in complete sentences Abd: Soft, non-tender. +bs Extremities: Warm, perfused. No significant pedal edema. BL feet without ulceration or callous. Pes planus BL. No sensation on microfilament testing.    Vitals:   12/08/17 1450 12/08/17 1524  BP: (!) 152/62 119/61  Pulse: 76 80  Temp: 98.1 F (36.7 C)   TempSrc: Oral   SpO2: 99%   Weight: 284 lb 14.4 oz (129.2 kg)   Height: 5' 5"  (1.651 m)    Body mass index is 47.41 kg/m.  Assessment & Plan:   See Encounters Tab for problem based charting.  Patient discussed with Dr. Rebeca Alert

## 2017-12-08 NOTE — Patient Instructions (Addendum)
It was nice seeing you today. Thank you for choosing [facility] for your Primary Care.   Today we talked about:  1) Diabetes: Your A1c was **. This indicates you are doing a great job with your blood sugars at home. Please continue your current medication regimen of Metformin 1054m twice daily and Glipizide 141mdaily. I will recheck your A1c in 6-73-month) High blood pressure: Your blood pressure was well controlled today. Please continue taking your Lisinopril-HCTZ, Amlodipine and Coreg as you are. 3) Health maintenance: You are up to date on all health screens except for a DEXA scan. This is an x-ray that screens for osteoporosis or osteopenia which can make it easier for you to develop fractures. They will call you with an appointment.  4) Foot drop: Today we talked about foot drop. It is most likely related to your diabetes but its unusual that it comes and goes. If your symptoms get worse please let us [place] and we can work this up further.   FOLLOW-UP INSTRUCTIONS When: 3-m47-monthr: Diabetes, htn What to bring:   Please contact the clinic if you have any problems, or need to be seen sooner.

## 2017-12-09 ENCOUNTER — Encounter: Payer: Self-pay | Admitting: Internal Medicine

## 2017-12-09 DIAGNOSIS — E2839 Other primary ovarian failure: Secondary | ICD-10-CM | POA: Insufficient documentation

## 2017-12-09 DIAGNOSIS — M21371 Foot drop, right foot: Secondary | ICD-10-CM | POA: Insufficient documentation

## 2017-12-09 NOTE — Assessment & Plan Note (Signed)
Patient agreeable to DEXA scan which has been ordered.

## 2017-12-09 NOTE — Assessment & Plan Note (Addendum)
Assessment:  Patient continues to complain of diarrhea which she feels alternates with constipation (notes 'constipation' 1 day of the week). Symptoms worse during times of stress and abdominal pain is relieved with defecation. She has not been using prn Imodium, fiber supplements, stool softeners or glycerin suppositories as recommended for symptomatic control. Colonoscopy in 2016 nml and symptoms have not changed since this evaluation.     Plan: Patient has been non-adherant to recommendations for some time now despite ongoing symptoms. She also refuses to start trial of TCA. Discussed how frustrating IBS can be and often is difficult to manage optimally. Discussed lifestyle modifications and was given information on the FODMAP diet. Also advised to reduce caffeine intake and take above medications as needed for symptomatic control.

## 2017-12-09 NOTE — Assessment & Plan Note (Addendum)
Assessment:  A1c 8.1%, up from 7.4% at our last check several months ago. Reports compliance with Metformin 1000 mg twice daily and Glipizide 10 mg daily but attributes worsened glycemic control is related to 2-3 months of dietary indiscretion due to summer weather. Reports going to BBQs and drinking sugary beverages. Her main complaint today is BL foot pain which she describes as burning which is most likely related to her diabetic peripheral neuropathy.  Foot exam without sensation on plantar aspect of BL feet but no ulcerations or calloses. Discussed proper foot care including frequent checks, keeping clean and dry and wearing closed-toe shoes.      Plan: She was strongly opposed to addition of a new medication today and was also opposed to meeting with MNT again. Also opposed to increasing her Gabapentin regimen for peripheral neuropathy pain. Will continue Metformin 2056m daily and Glipizide 135mdaily. Would consider addition of GLP1-RA or SGLT2-inhibitor should her A1c remain above goal at our next visit.   -RTC 3 months -Foot exam completed today -Eye exam scheduled 11/19 -She does not tolerate statins and refuses another trial

## 2017-12-09 NOTE — Assessment & Plan Note (Signed)
Assessment:  In addition to her BL foot tingling and burning she's noticed      Plan:

## 2017-12-09 NOTE — Assessment & Plan Note (Addendum)
Assessment:  Initially hypertensive but improved significantly on repeat to 119/61 with pulse 80. Reports compliance with Lisinopril-HCTZ 20-80m, Coreg 234mtwice daily and Amlodipine 1032maily. Denied LE swelling, muscle cramping, LH or dizziness. Most recent renal function wnl 9/18.      Plan: Patient to continue current regimen as noted above. Discussed diet, exercise and monitoring sodium intake. Will need BMET at our next visit.

## 2017-12-11 NOTE — Progress Notes (Signed)
Internal Medicine Clinic Attending  Case discussed with Dr. Danford Bad at the time of the visit.  We reviewed the resident's history and exam and pertinent patient test results.  I agree with the assessment, diagnosis, and plan of care documented in the resident's note.  Lenice Pressman, M.D., Ph.D.

## 2018-02-03 ENCOUNTER — Other Ambulatory Visit: Payer: Self-pay | Admitting: *Deleted

## 2018-02-03 DIAGNOSIS — E1142 Type 2 diabetes mellitus with diabetic polyneuropathy: Secondary | ICD-10-CM

## 2018-02-04 MED ORDER — GABAPENTIN 300 MG PO CAPS: 300.0000 mg | ORAL_CAPSULE | Freq: Three times a day (TID) | ORAL | 3 refills | Status: DC

## 2018-03-04 ENCOUNTER — Ambulatory Visit: Payer: Medicare Other

## 2018-03-05 ENCOUNTER — Other Ambulatory Visit: Payer: Self-pay

## 2018-03-05 ENCOUNTER — Encounter: Payer: Self-pay | Admitting: Internal Medicine

## 2018-03-05 ENCOUNTER — Ambulatory Visit (INDEPENDENT_AMBULATORY_CARE_PROVIDER_SITE_OTHER): Payer: Medicare Other | Admitting: Internal Medicine

## 2018-03-05 VITALS — BP 118/53 | HR 81 | Temp 97.5°F | Ht 65.0 in | Wt 277.3 lb

## 2018-03-05 DIAGNOSIS — M545 Low back pain, unspecified: Secondary | ICD-10-CM

## 2018-03-05 DIAGNOSIS — Z7984 Long term (current) use of oral hypoglycemic drugs: Secondary | ICD-10-CM | POA: Diagnosis not present

## 2018-03-05 DIAGNOSIS — I10 Essential (primary) hypertension: Secondary | ICD-10-CM

## 2018-03-05 DIAGNOSIS — Z79899 Other long term (current) drug therapy: Secondary | ICD-10-CM | POA: Diagnosis not present

## 2018-03-05 DIAGNOSIS — E1142 Type 2 diabetes mellitus with diabetic polyneuropathy: Secondary | ICD-10-CM | POA: Diagnosis not present

## 2018-03-05 DIAGNOSIS — M549 Dorsalgia, unspecified: Secondary | ICD-10-CM | POA: Insufficient documentation

## 2018-03-05 LAB — POCT GLYCOSYLATED HEMOGLOBIN (HGB A1C): Hemoglobin A1C: 10.4 % — AB (ref 4.0–5.6)

## 2018-03-05 LAB — GLUCOSE, CAPILLARY: GLUCOSE-CAPILLARY: 319 mg/dL — AB (ref 70–99)

## 2018-03-05 MED ORDER — DICLOFENAC SODIUM 1 % TD GEL: 4.0000 g | Freq: Four times a day (QID) | TRANSDERMAL | 1 refills | Status: DC

## 2018-03-05 MED ORDER — NAPROXEN 500 MG PO TABS: 500.0000 mg | ORAL_TABLET | Freq: Two times a day (BID) | ORAL | 0 refills | Status: AC

## 2018-03-05 MED ORDER — CANAGLIFLOZIN-METFORMIN HCL 50-1000 MG PO TABS: 1.0000 | ORAL_TABLET | Freq: Two times a day (BID) | ORAL | 3 refills | Status: DC

## 2018-03-05 NOTE — Progress Notes (Signed)
   CC: Diabetes  HPI:  [name] is a 67 y.o. female with past medical history outlined below here for diabetes. For the details of today's visit, please refer to the assessment and plan.  Past Medical History:  Diagnosis Date  . DIABETES MELLITUS, TYPE II 03/17/2006       . HYPERLIPIDEMIA 03/17/2006   Qualifier: Diagnosis of  By: Sharlet Salina MD, [place]    . HYPERTENSION 03/17/2006       . Morbid obesity ([place]) 01/25/2008   Qualifier: Diagnosis of  By: Phifer MD, Izora Gala      Review of Systems  Genitourinary: Negative for frequency.  Endo/Heme/Allergies: Negative for polydipsia.    Physical Exam:  Vitals:   03/05/18 1337 03/05/18 1412  BP: (!) 150/70 (!) 118/53  Pulse: 82 81  Temp: (!) 97.5 F (36.4 C)   TempSrc: Oral   SpO2: 97%   Weight: 277 lb 4.8 oz (125.8 kg)   Height: 5' 5"  (1.651 m)     Constitutional: NAD, appears comfortable Cardiovascular: RRR, no murmurs, rubs, or gallops.  Pulmonary/Chest: CTAB, no wheezes, rales, or rhonchi.  Extremities: Warm and well perfused.  No edema.  Psychiatric: Normal mood and affect  Assessment & Plan:   See Encounters Tab for problem based charting.  Patient discussed with Dr. Dareen Piano

## 2018-03-05 NOTE — Assessment & Plan Note (Addendum)
Well controlled, 118/51. Continue current regimen. -- Lisinopril-HCTZ 20-25 mg daily -- Amlodipine 10 mg daily -- Coreg 25 mg BID  -- BMP today   ADDENDUM: Renal function is normal. Continue current plan. Called patient with results.

## 2018-03-05 NOTE — Patient Instructions (Addendum)
Sonya Lane,  It was a pleasure to meet you. For your diabetes, please stop taking metformin, and start taking combination canagliflozin-metformin twice a day. Your blood pressure today is normal, continue to take your medicines as previously prescribed.   For your back pain, I have prescribed a topical pain medication. You may apply this up to 4 times a day as needed. I have also given you a prescription for naproxen (aleve), take this twice a day with food for 2 weeks. If your back pain does not improve, please come back for follow up.   If you develop constipation, Senna is a safe laxative that does not have sodium in it. You can buy this over the counter.   Come back to see [place] again in 3 months for your diabetes. If you have any questions or concerns, call our clinic at [phone] or after hours call [phone] and ask for the internal medicine resident on call. Thank you!  Dr. Philipp Ovens

## 2018-03-05 NOTE — Assessment & Plan Note (Signed)
Patient is here for diabetes follow up. She is uncontrolled, CBG today is 319 and A1c 10.4 up from 8.1 at her last visit three months ago. Since her last visit, she has stopped taking her glipizide due to side effect of constipation. She is taking metformin 1,000 mg BID. Today we discussed dietary and lifestyle changes. Advised low carb, low sugar diet. She denies referral to meet with nutrition.  -- Stop metformin -- Start canagliflozin-metformin 50-1000 mg BID -- Follow up 3 months

## 2018-03-05 NOTE — Assessment & Plan Note (Signed)
Patient has right lower back pain. No alarm features, reproducible with palpation. Likely MSK etiology. Patient declines muscle relaxer.  -- Voltaren gel QID prn -- Naproxen 500 mg BID x 2 weeks

## 2018-03-06 LAB — BMP8+ANION GAP
ANION GAP: 22 mmol/L — AB (ref 10.0–18.0)
BUN/Creatinine Ratio: 12 (ref 12–28)
BUN: 10 mg/dL (ref 8–27)
CALCIUM: 10.3 mg/dL (ref 8.7–10.3)
CHLORIDE: 93 mmol/L — AB (ref 96–106)
CO2: 25 mmol/L (ref 20–29)
Creatinine, Ser: 0.84 mg/dL (ref 0.57–1.00)
GFR calc Af Amer: 82 mL/min/{1.73_m2} (ref >59)
GFR calc non Af Amer: 71 mL/min/{1.73_m2} (ref >59)
GLUCOSE: 319 mg/dL — AB (ref 65–99)
POTASSIUM: 4.7 mmol/L (ref 3.5–5.2)
SODIUM: 140 mmol/L (ref 134–144)

## 2018-03-08 ENCOUNTER — Other Ambulatory Visit: Payer: Self-pay | Admitting: Internal Medicine

## 2018-03-08 ENCOUNTER — Other Ambulatory Visit: Payer: Self-pay | Admitting: *Deleted

## 2018-03-08 DIAGNOSIS — Z1231 Encounter for screening mammogram for malignant neoplasm of breast: Secondary | ICD-10-CM

## 2018-03-08 NOTE — Progress Notes (Signed)
Internal Medicine Clinic Attending  Case discussed with Dr. Guilloud at the time of the visit.  We reviewed the resident's history and exam and pertinent patient test results.  I agree with the assessment, diagnosis, and plan of care documented in the resident's note.  

## 2018-03-23 ENCOUNTER — Other Ambulatory Visit: Payer: Self-pay

## 2018-03-23 NOTE — Telephone Encounter (Signed)
Pt states [facility] needs additional information from the doctor about the metformin. Pt would like the nurse to contact the pharmacy.

## 2018-03-23 NOTE — Telephone Encounter (Signed)
Called [facility], pharmacist states that they had faxed md and have rec'd response, she has processed the script and pt will rec in 3 to 5 days

## 2018-03-26 ENCOUNTER — Telehealth: Payer: Self-pay | Admitting: *Deleted

## 2018-03-26 DIAGNOSIS — E1142 Type 2 diabetes mellitus with diabetic polyneuropathy: Secondary | ICD-10-CM

## 2018-03-26 MED ORDER — GLIPIZIDE-METFORMIN HCL 2.5-500 MG PO TABS: 2.0000 | ORAL_TABLET | Freq: Two times a day (BID) | ORAL | 11 refills | Status: DC

## 2018-03-26 NOTE — Telephone Encounter (Signed)
Called patient by phone, she states she is unable to afford the Invokamet and does not qualify for Medicare Extra Help due to income level. I offered patient clinic samples of similar medication, Synjardy, but patient refused. Educated patients on benefits of medication considering her elevated A1C. She insists she will continue metformin and glipizide for now and work on diet/exercise. She requested the combo pill glipizide-metformin, prescription sent. Will notify PCP.

## 2018-03-26 NOTE — Telephone Encounter (Signed)
Pt calls and states she cannot afford the invokana prescribed by dr Philipp Ovens, she states her copay is $125.00. Sending to dr Maudie Mercury and attending also to doriss. For new pcp assignment. She uses [facility]

## 2018-03-27 NOTE — Telephone Encounter (Signed)
Thank you Bonnita Nasuti and Dr. Maudie Mercury! Forwarded to PCP Dr. Sherry Ruffing.

## 2018-04-05 DIAGNOSIS — H2513 Age-related nuclear cataract, bilateral: Secondary | ICD-10-CM | POA: Diagnosis not present

## 2018-04-05 DIAGNOSIS — E119 Type 2 diabetes mellitus without complications: Secondary | ICD-10-CM | POA: Diagnosis not present

## 2018-04-05 DIAGNOSIS — Z7984 Long term (current) use of oral hypoglycemic drugs: Secondary | ICD-10-CM | POA: Diagnosis not present

## 2018-04-05 DIAGNOSIS — H25013 Cortical age-related cataract, bilateral: Secondary | ICD-10-CM | POA: Diagnosis not present

## 2018-04-14 ENCOUNTER — Ambulatory Visit
Admission: RE | Admit: 2018-04-14 | Discharge: 2018-04-14 | Disposition: A | Discharge status: Home / Self Care | Payer: Medicare Other | Source: Ambulatory Visit | Attending: Nurse Practitioner | Admitting: Nurse Practitioner

## 2018-04-14 ENCOUNTER — Ambulatory Visit: Payer: Medicare Other

## 2018-04-14 DIAGNOSIS — Z1231 Encounter for screening mammogram for malignant neoplasm of breast: Secondary | ICD-10-CM

## 2018-05-06 ENCOUNTER — Ambulatory Visit
Admission: RE | Admit: 2018-05-06 | Discharge: 2018-05-06 | Disposition: A | Discharge status: Home / Self Care | Payer: Medicare Other | Source: Ambulatory Visit | Attending: Student in an Organized Health Care Education/Training Program | Admitting: Student in an Organized Health Care Education/Training Program

## 2018-05-06 DIAGNOSIS — Z13820 Encounter for screening for osteoporosis: Secondary | ICD-10-CM | POA: Diagnosis not present

## 2018-05-06 DIAGNOSIS — E2839 Other primary ovarian failure: Secondary | ICD-10-CM

## 2018-05-06 DIAGNOSIS — Z780 Asymptomatic menopausal state: Secondary | ICD-10-CM | POA: Diagnosis not present

## 2018-05-14 ENCOUNTER — Other Ambulatory Visit: Payer: Self-pay | Admitting: Internal Medicine

## 2018-05-14 DIAGNOSIS — M545 Low back pain, unspecified: Secondary | ICD-10-CM

## 2018-05-17 ENCOUNTER — Other Ambulatory Visit: Payer: Self-pay | Admitting: *Deleted

## 2018-05-17 DIAGNOSIS — I10 Essential (primary) hypertension: Secondary | ICD-10-CM

## 2018-05-18 MED ORDER — NYSTATIN 100000 UNIT/GM EX CREA: 1.0000 "application " | TOPICAL_CREAM | Freq: Two times a day (BID) | CUTANEOUS | 0 refills | Status: DC

## 2018-05-18 MED ORDER — AMLODIPINE BESYLATE 10 MG PO TABS: 10.0000 mg | ORAL_TABLET | Freq: Every day | ORAL | 3 refills | Status: DC

## 2018-06-07 ENCOUNTER — Other Ambulatory Visit: Payer: Self-pay

## 2018-06-07 ENCOUNTER — Ambulatory Visit (INDEPENDENT_AMBULATORY_CARE_PROVIDER_SITE_OTHER): Payer: Medicare Other | Admitting: Internal Medicine

## 2018-06-07 ENCOUNTER — Encounter: Payer: Self-pay | Admitting: Internal Medicine

## 2018-06-07 VITALS — BP 135/60 | HR 78 | Temp 98.2°F | Ht 65.0 in | Wt 274.2 lb

## 2018-06-07 DIAGNOSIS — K589 Irritable bowel syndrome without diarrhea: Secondary | ICD-10-CM | POA: Diagnosis not present

## 2018-06-07 DIAGNOSIS — E1142 Type 2 diabetes mellitus with diabetic polyneuropathy: Secondary | ICD-10-CM

## 2018-06-07 DIAGNOSIS — Z23 Encounter for immunization: Secondary | ICD-10-CM

## 2018-06-07 DIAGNOSIS — E118 Type 2 diabetes mellitus with unspecified complications: Secondary | ICD-10-CM

## 2018-06-07 DIAGNOSIS — I10 Essential (primary) hypertension: Secondary | ICD-10-CM | POA: Diagnosis not present

## 2018-06-07 DIAGNOSIS — Z7984 Long term (current) use of oral hypoglycemic drugs: Secondary | ICD-10-CM

## 2018-06-07 DIAGNOSIS — K7581 Nonalcoholic steatohepatitis (NASH): Secondary | ICD-10-CM

## 2018-06-07 LAB — POCT GLYCOSYLATED HEMOGLOBIN (HGB A1C): Hemoglobin A1C: 8.1 % — AB (ref 4.0–5.6)

## 2018-06-07 LAB — GLUCOSE, CAPILLARY: Glucose-Capillary: 237 mg/dL — ABNORMAL HIGH (ref 70–99)

## 2018-06-07 MED ORDER — DULAGLUTIDE 0.75 MG/0.5ML ~~LOC~~ SOAJ: 0.7500 mg | SUBCUTANEOUS | 0 refills | Status: AC

## 2018-06-07 NOTE — Progress Notes (Signed)
   CC: diabetes  HPI:  [name] is a 68 y.o. with a PMH of T2DM, HTN, IBS, NASH, presenting to clinic for follow up on diabetes.  T2DM: Patient with last A1c of 10.4 due to stopping glipizide (se of constipation). She was initially changed to cangliflozin-metformin however was unable to afford so patient was in agreement with going back to glipizide-metformin 5-106m BID with work on diet and activity level. She states that she now drinks mainly water however does go through a 2L bottle of diet ginger ale over a 2d period. She snacks on potato chips or popcorn 3-4 times a week. She has continued going to the gym about 3 times a week (decreased from 5 times per week previously). She denies polyuria, polydipsia, vision changes, nausea, vomiting. Since starting the combination glipizide-metformin she has had occasional diarrhea (about once a week) but this is not too bothersome for her at this time.   Please see problem based Assessment and Plan for status of patients chronic conditions.  Past Medical History:  Diagnosis Date  . DIABETES MELLITUS, TYPE II 03/17/2006       . HYPERLIPIDEMIA 03/17/2006   Qualifier: Diagnosis of  By: Sonya Lane, Sonya Lane    . HYPERTENSION 03/17/2006       . Morbid obesity [place] 01/25/2008   Qualifier: Diagnosis of  By: Sonya Lane, Sonya Lane     Review of Systems:   Per HPI  Physical Exam:  Vitals:   06/07/18 1004  BP: 135/60  Pulse: 78  Temp: 98.2 F (36.8 C)  TempSrc: Oral  SpO2: 98%  Weight: 274 lb 3.2 oz (124.4 kg)  Height: 5' 5"  (1.651 m)   GENERAL- alert, co-operative, appears as stated age, not in any distress. HEENT- oral mucosa appears moist. CARDIAC- RRR, no murmurs, rubs or gallops. RESP- Moving equal volumes of air, and clear to auscultation bilaterally, no wheezes or crackles. ABDOMEN- Soft, nontender, bowel sounds present. EXTREMITIES- pulse 2+ PT, symmetric, no pedal edema.  Assessment & Plan:   See Encounters Tab for  problem based charting.   Patient discussed with Dr. MGerrit Friends Lane Internal Medicine PGY-3

## 2018-06-07 NOTE — Patient Instructions (Signed)
Continue your glipizide-metformin.  We'll start trulicity 0.40EB once a week; after a month if you're tolerating it (call us), we'll increase the dose to 1.13m once a week.  Continue working out! If you can increase that to 4 times a week that would be great!  We'll see you in about 3 months

## 2018-06-07 NOTE — Assessment & Plan Note (Addendum)
Chronic; uncontrolled but improved with A1c of 8.1 from 10.4.   Plan: --continue glipizide-metformin 3-4144HQ BID --Start trulicity 0.16DE weekly; appreciate pharmacy assistance in teaching --if tolerating well, after 1 month the dose should be increased to 1.39m weekly --encouraged continued diet changes and increasing exercise to 4-5 times a week if able --f/u in 3 months

## 2018-06-09 NOTE — Progress Notes (Signed)
Internal Medicine Clinic Attending  Case discussed with Dr. Svalina  at the time of the visit.  We reviewed the resident's history and exam and pertinent patient test results.  I agree with the assessment, diagnosis, and plan of care documented in the resident's note.  

## 2018-06-14 ENCOUNTER — Ambulatory Visit: Payer: Medicare Other | Admitting: Pharmacist

## 2018-07-12 ENCOUNTER — Other Ambulatory Visit: Payer: Self-pay | Admitting: Internal Medicine

## 2018-07-12 DIAGNOSIS — I10 Essential (primary) hypertension: Secondary | ICD-10-CM

## 2018-07-12 DIAGNOSIS — M545 Low back pain, unspecified: Secondary | ICD-10-CM

## 2018-07-12 NOTE — Telephone Encounter (Signed)
Patient needs all her medicine refills and she needs a yeast infection pill refill; [place], [place] pt contact [phone]

## 2018-07-12 NOTE — Telephone Encounter (Signed)
Talked to pt - informed she should have refills on all meds except Coreg and Lisinopril and Voltaren. Stated she does not need Voltaren gel. Requesting refill on Diflucan which she stated she takes for yeast infection d/t diabetes. Not on current med list - told I will send her request to her doctor but may need an appt. Stated she was just here last month.

## 2018-07-13 MED ORDER — CARVEDILOL 25 MG PO TABS: 25.0000 mg | ORAL_TABLET | Freq: Two times a day (BID) | ORAL | 3 refills | Status: DC

## 2018-07-13 MED ORDER — LISINOPRIL-HYDROCHLOROTHIAZIDE 20-25 MG PO TABS: 1.0000 | ORAL_TABLET | Freq: Every day | ORAL | 3 refills | Status: DC

## 2018-07-13 NOTE — Telephone Encounter (Signed)
Pt was called and informed of Dr Dorothyann Peng response to be eval for yeast infection. Pt stated her insurance will only pay for every 3 months diabetes visits. Asked front office to schedule an appt - appt has been scheduled for  April 13.

## 2018-07-13 NOTE — Telephone Encounter (Signed)
Hello,  I can refill these medications but I would like her to be seen in the clinic for the evaluation of the yeast infection. I would like for her to have a wet prep done. Thank you.

## 2018-08-13 ENCOUNTER — Telehealth: Payer: Self-pay

## 2018-08-13 ENCOUNTER — Ambulatory Visit (INDEPENDENT_AMBULATORY_CARE_PROVIDER_SITE_OTHER): Payer: Medicare Other | Admitting: Internal Medicine

## 2018-08-13 ENCOUNTER — Encounter: Payer: Self-pay | Admitting: Internal Medicine

## 2018-08-13 ENCOUNTER — Other Ambulatory Visit: Payer: Self-pay

## 2018-08-13 VITALS — BP 136/69 | HR 83 | Temp 97.6°F | Ht 65.0 in | Wt 278.0 lb

## 2018-08-13 DIAGNOSIS — H9312 Tinnitus, left ear: Secondary | ICD-10-CM | POA: Insufficient documentation

## 2018-08-13 DIAGNOSIS — Z9111 Patient's noncompliance with dietary regimen: Secondary | ICD-10-CM

## 2018-08-13 DIAGNOSIS — G8929 Other chronic pain: Secondary | ICD-10-CM

## 2018-08-13 DIAGNOSIS — M25569 Pain in unspecified knee: Secondary | ICD-10-CM | POA: Diagnosis not present

## 2018-08-13 DIAGNOSIS — Z7982 Long term (current) use of aspirin: Secondary | ICD-10-CM

## 2018-08-13 DIAGNOSIS — Z791 Long term (current) use of non-steroidal anti-inflammatories (NSAID): Secondary | ICD-10-CM

## 2018-08-13 NOTE — Assessment & Plan Note (Addendum)
Tinnitus: Sonya Lane reports of a 3-day history of continuous left-sided tinnitus that she feels is worsening.  She denies fevers, chills, flulike illness, pain, effusion, trauma, ear infection, vertigo, hearing loss or weakness.  She is chronically on low-dose aspirin 81 mg.  She has chronic knee pain for which she intermittently takes ibuprofen.  She reports this has never happened to her and also denies headache, nausea.  She reports of noncompliance to low-salt diet.  On physical exams, left outer ear was visualized without any lesions or evidence of trauma.  On otoscopic exam, tympanic membrane was visualized without evidence of erythema, bulging membrane or effusion.  Differential diagnosis includes medication induced tinnitus (NSAIDs, calcium channel blockers, ACE inhibitors) vs Mnire's disease vs otitis media vs BPPV vs vascular tinnitus vs sensorineural tinnitus.  Plan: - Patient advised on lifestyle modifications such as low-salt diet, cutting intake of caffeine - Referral to ENT for evaluation of tinnitus and audiologic evaluation. If hearing loss id conformed, she will require MRI to rule our acoustic neuroma

## 2018-08-13 NOTE — Telephone Encounter (Signed)
Ringing in ears 2 days Denies fever, ?balance issues, denies N&V. ACC this pm

## 2018-08-13 NOTE — Progress Notes (Signed)
   CC: Tinnitus  HPI:  Sonya Lane Driggs is a 68 y.o. with medical history listed below presenting for evaluation of tinnitus.  Please see problem based charting for further details.  Past Medical History:  Diagnosis Date  . DIABETES MELLITUS, TYPE II 03/17/2006       . HYPERLIPIDEMIA 03/17/2006   Qualifier: Diagnosis of  By: Sharlet Salina MD, [place]    . HYPERTENSION 03/17/2006       . Morbid obesity ([place]) 01/25/2008   Qualifier: Diagnosis of  By: Phifer MD, Izora Gala     Review of Systems: As per HPI  Physical Exam:  Vitals:   08/13/18 1444  BP: 136/69  Pulse: 83  Temp: 97.6 F (36.4 C)  TempSrc: Oral  SpO2: 98%  Weight: 278 lb (126.1 kg)  Height: 5' 5"  (1.651 m)   Physical Exam Constitutional:      General: She is not in acute distress.    Appearance: She is obese. She is not ill-appearing.  HENT:     Head: Normocephalic and atraumatic.     Right Ear: Tympanic membrane, ear canal and external ear normal. There is no impacted cerumen.     Left Ear: Tympanic membrane, ear canal and external ear normal. There is no impacted cerumen.  Eyes:     Conjunctiva/sclera: Conjunctivae normal.  Cardiovascular:     Rate and Rhythm: Normal rate and regular rhythm.     Heart sounds: Normal heart sounds.  Pulmonary:     Breath sounds: No wheezing, rhonchi or rales.  Neurological:     Mental Status: She is alert.  Psychiatric:        Mood and Affect: Mood normal.        Behavior: Behavior normal.     Assessment & Plan:   See Encounters Tab for problem based charting.  Patient discussed with Dr. Lynnae January

## 2018-08-13 NOTE — Telephone Encounter (Signed)
thanks

## 2018-08-13 NOTE — Telephone Encounter (Signed)
Requesting to speak with a nurse about ringing in the ear. Please call pt back.

## 2018-08-13 NOTE — Progress Notes (Signed)
Internal Medicine Clinic Attending  Case discussed with Dr. Eileen Stanford at the time of the visit.  We reviewed the resident's history and exam and pertinent patient test results.  I agree with the assessment, diagnosis, and plan of care documented in the resident's note.  Medication induced tinnitus less likely as no med is new. Vascular tinnitus less likely bc not pulsatile. Otitis media R/O with hx and exam. Meniere disease less likely bc no hearing loss or vertigo. Sensorineural tinnitus most likely. Bc no hearing loss, urgent MRI to R/O acoustic neuroma not indicated. Agree with ENT referral.

## 2018-08-13 NOTE — Patient Instructions (Signed)
Sonya Lane,  It was a pleasure seeing you here at the clinic today and sorry to hear about your ongoing ringing in your ear.  Here are my recommendations after today's visit.  The condition you have is called tendinitis and there could be several mechanisms that can cause this.  Some medications such as high-dose aspirin, lisinopril, amlodipine, omeprazole can cause this but it does not seem like all these medications were recently started so I doubt the other cause of the ringing in your ear.  I would advise some simple lifestyle modifications -Continue your routine exercise as you have been doing -Please decrease the amount of salt, caffeine intake  I have also referred you to the ear doctor.  Take Care  Dr. Eileen Stanford  Please call the internal medicine center clinic if you have any questions or concerns, we may be able to help and keep you from a long and expensive emergency room wait. Our clinic and after hours phone number is [phone], the best time to call is Monday through Friday 9 am to 4 pm but there is always someone available 24/7 if you have an emergency. If you need medication refills please notify your pharmacy one week in advance and they will send [place] a request.

## 2018-08-23 ENCOUNTER — Telehealth: Payer: Self-pay | Admitting: Internal Medicine

## 2018-08-23 NOTE — Telephone Encounter (Signed)
Pt is still having ear pain, pt came in 03/13 per pt the physician didn't see anything, pls contact [phone]

## 2018-08-23 NOTE — Telephone Encounter (Signed)
Hi,   I called the patient and reports that she continued to have ear pain. She inquired if she had an " ear infection."  During my evaluation of her at the last visit, tympanic membrane was not erythematous, bulgy or tender.  The internal auditory canal was also unremarkable.  Currently, she does not report of fevers, chills.  I advised her to follow-up with ENT for further evaluation and continue symptomatic management with Tylenol and ibuprofen.

## 2018-09-13 ENCOUNTER — Encounter: Payer: Medicare Other | Admitting: Internal Medicine

## 2018-09-13 ENCOUNTER — Other Ambulatory Visit: Payer: Self-pay

## 2018-09-15 ENCOUNTER — Telehealth: Payer: Self-pay | Admitting: *Deleted

## 2018-09-15 NOTE — Telephone Encounter (Signed)
Pt calls and is angry She states she was supposed to have an appt mon but it was cancelled and she isnt happy, she then states she cannot come to the hospital because of [place] and her age. She also states she is not having a problem but her ear still hurts and is worse Triage called dr crossley's office, receptionist states he retired 3/30 and would suggest dr Lucia Gaskins at [phone], called dr Pollie Friar office, he will see pt 4/16 at 1315.  Gave pt appt time w/ dr Lucia Gaskins and then she ask when she would be able to see her dr, she states she was to speak w/ dr mon and did not hear anything, she is ask was there anything specific she would like to ask dr Sherry Ruffing and she states no shes fine. Informed her unless she has a problem until the [place] issue is over will not have a formal appt but that md may choose to call her.she is agreeable.

## 2018-09-16 DIAGNOSIS — H9313 Tinnitus, bilateral: Secondary | ICD-10-CM | POA: Diagnosis not present

## 2018-09-16 DIAGNOSIS — H903 Sensorineural hearing loss, bilateral: Secondary | ICD-10-CM | POA: Diagnosis not present

## 2018-10-14 ENCOUNTER — Telehealth: Payer: Self-pay

## 2018-10-14 DIAGNOSIS — B379 Candidiasis, unspecified: Secondary | ICD-10-CM

## 2018-10-14 DIAGNOSIS — B3741 Candidal cystitis and urethritis: Secondary | ICD-10-CM

## 2018-10-14 MED ORDER — FLUCONAZOLE 150 MG PO TABS: 150.0000 mg | ORAL_TABLET | Freq: Once | ORAL | 0 refills | Status: AC

## 2018-10-14 NOTE — Telephone Encounter (Signed)
Requesting to speak with a nurse about getting yeast infection med. Please call pt back.

## 2018-10-14 NOTE — Telephone Encounter (Signed)
Contacted patient, she reports that she has had frequent UTIs infections in the past and had always been given Diflucan, she gets them pretty frequently but has not had any since this year.  She reports that she is having some clear discharge, as well as an itching and burning sensation as well some discomfort when she urinates.  She denies any fevers, chills, nausea, vomiting, headaches, lightheadedness, dizziness, or other symptoms.  Discussed that I think it is reasonable to prescribe a dose of Diflucan and that if symptoms recur we may need to consider further testing.  Patient reports that she still having some ear pain, I advised her that she will need to have another appointment to discuss this, send a message to the front desk to schedule an appointment in a few weeks.   Plan: -Diflucan 150 mg once

## 2018-10-14 NOTE — Telephone Encounter (Signed)
Pt continues to be angry, states the nurse set her up and appt w/ ENT and they couldn't find anything wrong but her ear still hurts. Nurse states I thought you were calling about yeast infection, I am but im calling about my ear too, I didn't get to have my appt with my dr" she was offered an appt and said well what are you going to do" Dr Sherry Ruffing would you consider calling her or seeing her very soon She wants diflucan filled Ear?

## 2018-10-20 ENCOUNTER — Other Ambulatory Visit: Payer: Self-pay

## 2018-10-20 ENCOUNTER — Ambulatory Visit (INDEPENDENT_AMBULATORY_CARE_PROVIDER_SITE_OTHER): Payer: Medicare Other | Admitting: Internal Medicine

## 2018-10-20 ENCOUNTER — Encounter: Payer: Self-pay | Admitting: Internal Medicine

## 2018-10-20 VITALS — BP 147/72 | HR 80 | Temp 98.2°F | Wt 275.8 lb

## 2018-10-20 DIAGNOSIS — E559 Vitamin D deficiency, unspecified: Secondary | ICD-10-CM | POA: Insufficient documentation

## 2018-10-20 DIAGNOSIS — I10 Essential (primary) hypertension: Secondary | ICD-10-CM | POA: Diagnosis not present

## 2018-10-20 DIAGNOSIS — B379 Candidiasis, unspecified: Secondary | ICD-10-CM | POA: Diagnosis not present

## 2018-10-20 DIAGNOSIS — Z7984 Long term (current) use of oral hypoglycemic drugs: Secondary | ICD-10-CM

## 2018-10-20 DIAGNOSIS — Z79899 Other long term (current) drug therapy: Secondary | ICD-10-CM

## 2018-10-20 DIAGNOSIS — H9312 Tinnitus, left ear: Secondary | ICD-10-CM

## 2018-10-20 DIAGNOSIS — E1142 Type 2 diabetes mellitus with diabetic polyneuropathy: Secondary | ICD-10-CM | POA: Diagnosis not present

## 2018-10-20 LAB — POCT GLYCOSYLATED HEMOGLOBIN (HGB A1C): Hemoglobin A1C: 8.5 % — AB (ref 4.0–5.6)

## 2018-10-20 LAB — GLUCOSE, CAPILLARY: Glucose-Capillary: 212 mg/dL — ABNORMAL HIGH (ref 70–99)

## 2018-10-20 MED ORDER — METFORMIN HCL 1000 MG PO TABS: 1000.0000 mg | ORAL_TABLET | Freq: Two times a day (BID) | ORAL | 0 refills | Status: DC

## 2018-10-20 NOTE — Progress Notes (Signed)
   CC: Still having ringing in her ears, diabetes, yeast infection, vitamin D deficiency  HPI:  [name] is a 68 y.o.   Past Medical History:  Diagnosis Date  . DIABETES MELLITUS, TYPE II 03/17/2006       . HYPERLIPIDEMIA 03/17/2006   Qualifier: Diagnosis of  By: Sharlet Salina MD, [place]    . HYPERTENSION 03/17/2006       . Morbid obesity ([place]) 01/25/2008   Qualifier: Diagnosis of  By: Phifer MD, Izora Gala     Review of Systems: Patient reports ringing in her left ear, some mild pain behind.  She denies any fevers, chills, flulike symptoms, confusion, hearing changes, chills, vomiting, headaches, headedness, dizziness, joint issues, or other symptoms.  Physical Exam:  Vitals:   10/20/18 1019 10/20/18 1113  BP: (!) 152/62 (!) 147/72  Pulse: 82 80  Temp: 98.2 F (36.8 C)   TempSrc: Oral   SpO2: 100%   Weight: 275 lb 12.8 oz (125.1 kg)    Physical Exam  Constitutional: She is well-developed, well-nourished, and in no distress.  HENT:  Head: Normocephalic and atraumatic.  Right Ear: External ear normal.  Left Ear: External ear normal.  Mouth/Throat: Oropharynx is clear and moist.  Bilateral tympanic membranes showed no effusion, regular light reflex.  No erythema or mass in external auditory canal. Rhinne in both ears AC>BC. No appreciable lateralization of Weber test.   Cardiovascular: Normal rate, regular rhythm and normal heart sounds.  Pulmonary/Chest: Effort normal and breath sounds normal. No respiratory distress.  Musculoskeletal: Normal range of motion.        General: No edema.     Assessment & Plan:   See Encounters Tab for problem based charting.  Patient discussed with Dr. Angelia Mould

## 2018-10-20 NOTE — Assessment & Plan Note (Signed)
Patient is currently on lisinopril-HCTZ 20-25 mg daily, amlodipine 10 mg daily, and Coreg 25 mg twice daily.  Blood pressure elevated today.  Previous readings were all WNL.  She denied any issues taking her medications, denied any side effects to medications.  Patient reported that she did not change any medications at this time, we are currently adjusting her diabetes medications to evaluate her tinnitus. BP Readings from Last 3 Encounters:  10/20/18 (!) 147/72  08/13/18 136/69  06/07/18 135/60   Plan: Continue current medications, if blood pressure still elevated on follow-up can increase the lisinopril-HCTZ combo pill or the Coreg.

## 2018-10-20 NOTE — Assessment & Plan Note (Signed)
Patient contacted clinic about 2 days ago due to a yeast infection, with some low back pain, with an itching and burning sensation in the vaginal area.  She denied any dysuria, or discharge.  She was sent in a prescription for Diflucan but patient reports that she has not started this medication yet.  Patient denies any changes in her symptoms from her telephone appointment.  -Advised patient to take the Diflucan

## 2018-10-20 NOTE — Progress Notes (Signed)
Internal Medicine Clinic Attending  I saw and evaluated the patient.  I personally confirmed the key portions of the history and exam documented by Dr. Krienke and I reviewed pertinent patient test results.  The assessment, diagnosis, and plan were formulated together and I agree with the documentation in the resident's note.    

## 2018-10-20 NOTE — Assessment & Plan Note (Addendum)
Patient had a history of vitamin D deficiency in the past, vitamin D was down to 17 in 2017.  He had been on vitamin D supplements weekly, however this was discontinued about a year ago and she has not been on anything since that time.  We will recheck labs to evaluate if she requires further vitamin D supplementation.  -Check Vitamin D level  Addendum: Vitamin D level 26, informed patient via phone call to start taking vitamin D3 800 units over-the-counter.  Vitamin D is in the range of 20-30 so she does not require large doses of vitamin D at this time. She is in agreement with this plan.

## 2018-10-20 NOTE — Assessment & Plan Note (Signed)
Patient reports that she has been having left ear tinnitus since February, she reports that it has been almost daily, sounds like a cleaning/alarm sound in her left ear, and will often wake her up at night, she denies any changes with certain positions, changes with location, or being inside or outside.  Denied any fevers, chills, flulike symptoms, aches, lightheadedness, dizziness, or other symptoms.  She denied any recent trauma, or any known trauma when she was a kid.  She does have a history of cancers in the family, her son had lung cancer, and mother had bladder cancer, no history of skin cancers or skin issues in the family.  She did go to the ENT and had a hearing test done that showed bilateral decreased hearing of high-frequency noises, thought to be due to age-related sensorineural hearing loss.  She was provided a sample of Lipo flavonoid which they report helped with a small percentage of patients, however she reports she has not tried that due to not wanting to take more pills. She states that the medication that was changed around the time that her symptoms started was starting glipizide-metformin.  On exam she has normal bilateral tympanic membranes with a normal light reflex, no bulging or effusion noted, no masses or lesions noted. No swelling noted around the outer ear, no lesions or bruises noted. She had a normal Rhinne test, and no lateralization of the Weber test.   Overall this appears to likely be related to the sensorineural hearing loss due to age-related changes, however given the correlation with adding the glipizide medication there is a small chance that this could be related.  This does not appear to be an infectious cause, no trauma, no masses or lesions noted.  We discussed trialing her off of the glipizide for her a month to see if that will have any effect and she is in agreement with that plan.  Patient also has the Lipo flavonoid that the ENT gave her, we discussed that she  should make 1 change at a time.   Plan: -Discontinue glipizide-metformin, will replace with metformin -Hold Lipo flavonoid for now, if symptoms still persist on follow-up visit advise patient to start taking this medication -RTC in 1 month for follow up -If symptoms still persist can consider obtaining an MRI to evaluate for acoustic neuroma

## 2018-10-20 NOTE — Assessment & Plan Note (Addendum)
Patient is on glipizide-metformin 09-998 mg twice daily, she has supposed to have started Trulicity on her last visit however there is issues with insurance coverage so she has not been on this.  Patient does not have her blood sugars at home.  She denies any issues taking her medications however she does state that the glipizide-metformin was the only medication that was started around the time she started having issues with the ringing in the ear.  Currently there is no definitive diagnosis for her tinnitus and we will trial her off of the glipizide portion of medication for now.  And can start a different medication such as SGLT2 inhibitor on her next visit if tinnitus improves.  Plan: -Stop glipizide-metformin -Current metformin 1000 mg twice daily -Check A1c >> elevated at 8.5 -Follow-up in 1 month to evaluate tinnitus, CBGs elevated at that time can add an SGLT2 inhibitor

## 2018-10-20 NOTE — Patient Instructions (Signed)
[name],  It was a pleasure to see you today. Thank you for coming in.   Today we discussed your ringing in your ears. This may ben related to hearing loss due to age, however since you have started that new medication around the time that this started we will trial you off of this medication to see how you do. Please stop taking the Glipizide-Metformin and start taking Metformin 1000 mg twice a day. We are checking your A1c to see where you are.   We also discussed your vitamin D defeciency. We are checking some labs to see if you still need to be on Vitamin D, if so I will send in a prescription for this.  We also discussed your yeast infection, please take the Diflucan that I had sent to your pharmacy.   Please return to clinic in 1 month or sooner if needed.   Thank you again for coming in.   [name].

## 2018-10-21 LAB — VITAMIN D 25 HYDROXY (VIT D DEFICIENCY, FRACTURES): Vit D, 25-Hydroxy: 26.2 ng/mL — ABNORMAL LOW (ref 30.0–100.0)

## 2018-10-22 ENCOUNTER — Telehealth: Payer: Self-pay | Admitting: Internal Medicine

## 2018-10-22 NOTE — Telephone Encounter (Signed)
Entered in error

## 2018-12-14 ENCOUNTER — Other Ambulatory Visit: Payer: Self-pay

## 2018-12-14 DIAGNOSIS — B379 Candidiasis, unspecified: Secondary | ICD-10-CM

## 2018-12-14 NOTE — Telephone Encounter (Signed)
Pt would like a refill on yeast infection med @  [facility], [place] [phone] (Phone) [phone] (Fax)

## 2018-12-14 NOTE — Telephone Encounter (Signed)
Returned call to patient. States she's been "fighting a vaginal yeast infection since last week." C/o vaginal itching, burning, area is "all roughed up and something like cottage cheese is coming out." Denies hyperglycemia or recent antibiotic use. Would like refill sent to [facility]  L. Silvano Rusk, RN, BSN

## 2018-12-15 MED ORDER — FLUCONAZOLE 150 MG PO TABS: 150.0000 mg | ORAL_TABLET | Freq: Every day | ORAL | 0 refills | Status: DC

## 2018-12-15 NOTE — Telephone Encounter (Signed)
Contacted patient in regards to her vaginal discomfort.  She states that for the past weeks she has been having pruritus in her vaginal area, feels that it is somewhat swollen and has some mild discharge that she describes as a brownish color, she also reports pain with urination and pain and itchiness prior to urinating.  She reports that she has frequent yeast infections and typically will take Diflucan prior to her symptoms worsening, she denied any abdominal pain, fevers, chills, nausea, vomiting, or other symptoms.  Discussed that I would prefer to have her come in to be evaluated however patient did not want to do this.  We discussed that I can send in the [place] and to have her call back in a few days following taking the Diflucan and that if her symptoms persist or worsen at that time we can have her come in to be further evaluated.  She is in agreement with this plan.

## 2018-12-29 ENCOUNTER — Other Ambulatory Visit (HOSPITAL_COMMUNITY)
Admission: RE | Admit: 2018-12-29 | Discharge: 2018-12-29 | Disposition: A | Discharge status: Home / Self Care | Payer: Medicare Other | Source: Ambulatory Visit | Attending: Internal Medicine | Admitting: Internal Medicine

## 2018-12-29 ENCOUNTER — Ambulatory Visit (INDEPENDENT_AMBULATORY_CARE_PROVIDER_SITE_OTHER): Payer: Medicare Other | Admitting: Internal Medicine

## 2018-12-29 ENCOUNTER — Ambulatory Visit: Payer: Medicare Other | Admitting: Dietician

## 2018-12-29 ENCOUNTER — Encounter: Payer: Self-pay | Admitting: Internal Medicine

## 2018-12-29 ENCOUNTER — Other Ambulatory Visit: Payer: Self-pay

## 2018-12-29 VITALS — BP 124/66 | HR 88 | Temp 97.8°F | Wt 271.7 lb

## 2018-12-29 DIAGNOSIS — B379 Candidiasis, unspecified: Secondary | ICD-10-CM | POA: Insufficient documentation

## 2018-12-29 DIAGNOSIS — Z79899 Other long term (current) drug therapy: Secondary | ICD-10-CM

## 2018-12-29 DIAGNOSIS — H9312 Tinnitus, left ear: Secondary | ICD-10-CM

## 2018-12-29 DIAGNOSIS — K582 Mixed irritable bowel syndrome: Secondary | ICD-10-CM

## 2018-12-29 DIAGNOSIS — I10 Essential (primary) hypertension: Secondary | ICD-10-CM

## 2018-12-29 DIAGNOSIS — B373 Candidiasis of vulva and vagina: Secondary | ICD-10-CM | POA: Diagnosis not present

## 2018-12-29 DIAGNOSIS — E1142 Type 2 diabetes mellitus with diabetic polyneuropathy: Secondary | ICD-10-CM

## 2018-12-29 DIAGNOSIS — Z6841 Body Mass Index (BMI) 40.0 and over, adult: Secondary | ICD-10-CM

## 2018-12-29 DIAGNOSIS — Z7984 Long term (current) use of oral hypoglycemic drugs: Secondary | ICD-10-CM

## 2018-12-29 LAB — GLUCOSE, CAPILLARY: Glucose-Capillary: 257 mg/dL — ABNORMAL HIGH (ref 70–99)

## 2018-12-29 MED ORDER — LIRAGLUTIDE 18 MG/3ML ~~LOC~~ SOPN: PEN_INJECTOR | SUBCUTANEOUS | 1 refills | Status: DC

## 2018-12-29 MED ORDER — FLUCONAZOLE 150 MG PO TABS: ORAL_TABLET | ORAL | 0 refills | Status: DC

## 2018-12-29 NOTE — Progress Notes (Signed)
CC: Diabetes, tinnitus, yeast infection and hypertension  HPI:  Sonya Lane is a 68 y.o.  with a PMH listed below presenting for diabetes, tinnitus, yeast infection and hypertension.   Please see A&P for status of the patient's chronic medical conditions  Past Medical History:  Diagnosis Date  . DIABETES MELLITUS, TYPE II 03/17/2006       . HYPERLIPIDEMIA 03/17/2006   Qualifier: Diagnosis of  By: Sharlet Salina MD, [place]    . HYPERTENSION 03/17/2006       . Morbid obesity ([place]) 01/25/2008   Qualifier: Diagnosis of  By: Phifer MD, Izora Gala     Review of Systems: Refer to history of present illness and assessment and plans for pertinent review of systems, all others reviewed and negative.  Physical Exam:  Vitals:   12/29/18 1422  BP: 124/66  Pulse: 88  Temp: 97.8 F (36.6 C)  TempSrc: Oral  SpO2: 97%  Weight: 271 lb 11.2 oz (123.2 kg)    Physical Exam  Constitutional: She is oriented to person, place, and time.  Well developed, morbidly obese, NAD  Neck: Normal range of motion. Neck supple. No thyromegaly present.  Cardiovascular: Normal rate, regular rhythm and normal heart sounds.  Pulmonary/Chest: Effort normal and breath sounds normal. No respiratory distress.  Abdominal: Soft. Bowel sounds are normal. She exhibits no distension.  Genitourinary:    Vagina and cervix normal.     Genitourinary Comments: Normal appearing mucosa, no lesions noted, some white discharge noted in vaginal canal.    Musculoskeletal: Normal range of motion.        General: No edema.  Neurological: She is alert and oriented to person, place, and time.  Skin: Skin is warm and dry.  Psychiatric: Mood and affect normal.    Social History   Socioeconomic History  . Marital status: Single    Spouse name: Not on file  . Number of children: Not on file  . Years of education: 26  . Highest education level: Not on file  Occupational History  . Not on file  Social Needs  . Financial  resource strain: Not on file  . Food insecurity    Worry: Not on file    Inability: Not on file  . Transportation needs    Medical: Not on file    Non-medical: Not on file  Tobacco Use  . Smoking status: Never Smoker  . Smokeless tobacco: Never Used  Substance and Sexual Activity  . Alcohol use: No  . Drug use: No  . Sexual activity: Not on file  Lifestyle  . Physical activity    Days per week: Not on file    Minutes per session: Not on file  . Stress: Not on file  Relationships  . Social Herbalist on phone: Not on file    Gets together: Not on file    Attends religious service: Not on file    Active member of club or organization: Not on file    Attends meetings of clubs or organizations: Not on file    Relationship status: Not on file  . Intimate partner violence    Fear of current or ex partner: Not on file    Emotionally abused: Not on file    Physically abused: Not on file    Forced sexual activity: Not on file  Other Topics Concern  . Not on file  Social History Narrative   [name]  September 12, 2009 11:11 AM  Financial assistance approved for 40% discount at [facility] and has [facility] card   [facility]  Oct 02, 2009 2:20 PM    No family history on file.  Assessment & Plan:   See Encounters Tab for problem based charting.  Patient seen with Dr. Dareen Piano

## 2018-12-29 NOTE — Patient Instructions (Signed)
Ms. [name],  It was a pleasure to see you today. Thank you for coming in.   Today we discussed your diabetes, ringing in your ears, and yeast infection.  In regards to your diabetes, please continue taking metformin. Please start taking Victoza 0.6 mg daily for the next week then increase it to 1.2 mg daily.   We also discussed your ringing in your ears. I have ordered a brain MRI to check for other causes but this may be related to general aging. I will call you with the results.   In regards to your yeast infection, I have sent in a prescription for the fluconazole. I will call you with the results of the sample and let you know if we need to add anything else.   Please return to clinic in 1 month or sooner if needed.   Thank you again for coming in.   [name].

## 2018-12-30 LAB — CERVICOVAGINAL ANCILLARY ONLY
Bacterial vaginitis: NEGATIVE
Candida vaginitis: POSITIVE — AB
Trichomonas: NEGATIVE

## 2018-12-30 MED ORDER — ACCU-CHEK GUIDE VI STRP: ORAL_STRIP | 3 refills | Status: DC

## 2018-12-30 MED ORDER — INSULIN PEN NEEDLE 32G X 4 MM MISC: 3 refills | Status: DC

## 2018-12-30 MED ORDER — ACCU-CHEK FASTCLIX LANCETS MISC: 1.0000 | Freq: Three times a day (TID) | 3 refills | Status: DC

## 2018-12-30 NOTE — Assessment & Plan Note (Signed)
Patient is currently on metformin 1000 mg BID at this time, she had been on glipizide previously however on her last visit she was reporting tinnitus that this may have been related so her glipizide was discontinued at that time. Patient reported that she continues to have tinnitus. She states that she does not check her blood sugars at home. Her A1c in May 2020 was elevated at 8.5. Her glucose today was elevated at 257. We discussed that she will likely need to add another medication and discussed starting Victoza.   Plan: -Continue metformin 1000 mg BID -Start Victoza, 0.6 mg daily for 1 week then increase to 1.2 mg daily -Referral to diabetes educator (she was shown how to take the Victoza) -Prescription for meter and glucose strips sent in -RTC in 1 month, repeat A1c at that time

## 2018-12-30 NOTE — Assessment & Plan Note (Signed)
Patient is on lisinopril-hctz 20-25 mg daily, amlodipine 10 mg daily, and coreg 25 mg BID. She denies any issues taking her medications. BP is well controlled today.   BP Readings from Last 3 Encounters:  12/29/18 124/66  10/20/18 (!) 147/72  08/13/18 136/69   Plan: Continue lisinopril-hctz 20-25 mg daily, amlodipine 10 mg daily, and coreg 25 mg BID

## 2018-12-31 ENCOUNTER — Telehealth: Payer: Self-pay | Admitting: *Deleted

## 2018-12-31 DIAGNOSIS — E1142 Type 2 diabetes mellitus with diabetic polyneuropathy: Secondary | ICD-10-CM

## 2018-12-31 MED ORDER — ACCU-CHEK FASTCLIX LANCETS MISC: 1.0000 | Freq: Every day | 3 refills | Status: DC

## 2018-12-31 NOTE — Telephone Encounter (Signed)
I sent in the updated prescription. Thank you.

## 2018-12-31 NOTE — Telephone Encounter (Signed)
Received faxed medication clarification request from [facility].  Please clarify directions for use for the fastclix lancets. Should pt test once daily or test 3 times daily before meals?  Of note, the directions for the test strips is for patient to test one time daily.   Please send in a new rx for the lancets to [facility].  Thank you.Despina Hidden Cassady7/31/20209:52 AM

## 2018-12-31 NOTE — Progress Notes (Addendum)
Internal Medicine Clinic Attending  I saw and evaluated the patient.  I personally confirmed the key portions of the history and exam documented by Dr. Krienke and I reviewed pertinent patient test results.  The assessment, diagnosis, and plan were formulated together and I agree with the documentation in the resident's note.    

## 2018-12-31 NOTE — Assessment & Plan Note (Signed)
Patient reports that she is continuing to have a burning sensation in the genital area and some thick whitish discharge. She also endorses some increased in urinary frequency. Denies any dysuria, hematuria, fevers, chills, nausea or vomiting. She states that she took the diflucan and reports that it did not help. On exam she does have some white discharge in the vaginal canal, no lesions or masses noted.   Plan: -Obtain wet prep -Give fluconazole x2 doses empirically for now

## 2018-12-31 NOTE — Assessment & Plan Note (Addendum)
She reports that she is continuing to have tinnitus, she states that it has improved a little bit but that it's still present. She had held her glipizide on her last visit however she reports that this has not helped. She has seen ENT in the past and had hearing test that shorted bilateral decreased hearing of high-frequency noises, thought to be due to sensorineural hearing loss, she tried the lipo flavonoid which she reports did not help at all. On her last visit we discussed obtaining an MRI, discussed this with patient and she stated that she would like to do this. Could be related to an acoustic neuroma.  -MRI brain

## 2019-01-03 ENCOUNTER — Encounter: Payer: Self-pay | Admitting: Dietician

## 2019-01-03 NOTE — Progress Notes (Signed)
Late entry for 12/29/18 at 4 PM: patient educated on 12/29/18 about Victoza, how to use Victoza pen, storage, rotation of injection sites and possible side effects.  She verbalized understanding. She was also provided with a sample blood sugar meter today.  Per patient request also called her pharmacy to find out cost of the Victoza. On call today, she is to receive the Victoza about 01/07/19 and says she can get one month's supply for 50$. She could not afford the amount ordered all at one time. I encouraged her to reach out to our office if she has trouble affording the Victoza or injection it once she gets it.  Butch Penny Plyler, RD 01/03/2019 10:03 AM.

## 2019-01-07 ENCOUNTER — Telehealth: Payer: Self-pay | Admitting: Dietician

## 2019-01-07 NOTE — Telephone Encounter (Signed)
Called Sonya Lane per referral and also to follow up on her tolerance of liraglutide. She started the liraglutide Wednesday. She has had no side effects and blood sugars have not changed much still in the 200s. She verbalized understanding that it may take until she is on the higher dose. She again verbalizes that she is concerned about the price. I reminded her that this shipment included money that she had to pay on her yearly deductible and her next shipment will be considerably lower. She noted that she needs a refill for her next shipment liraglutide and it will need to be 18 mL for a 90 day prescription at her current dose. She got 60 day shipment this past week.  She agreed to an appointment for diabetes and nutrition services on the same day she meets with Dr. Sherry Ruffing in 1 month. Will ask front office to contact her with those appointments.

## 2019-01-13 ENCOUNTER — Ambulatory Visit (HOSPITAL_COMMUNITY): Payer: Medicare Other

## 2019-01-19 ENCOUNTER — Telehealth: Payer: Self-pay | Admitting: Dietician

## 2019-01-19 NOTE — Telephone Encounter (Addendum)
Sonya Lane says she likes the [place]. It is helping her to "Not crave starches and her blood sugars (checking at different times of day) are under 200. She scheduled an appointment with me for Thursday, 02/10/19.

## 2019-01-23 NOTE — Progress Notes (Deleted)
   CC: ***  HPI:  Ms.Mel Jerilynn Mages Streight is a 68 y.o.  with a PMH listed below presenting for ***   Please see A&P for status of the patient's chronic medical conditions  Past Medical History:  Diagnosis Date  . DIABETES MELLITUS, TYPE II 03/17/2006       . HYPERLIPIDEMIA 03/17/2006   Qualifier: Diagnosis of  By: Sharlet Salina MD, [place]    . HYPERTENSION 03/17/2006       . Morbid obesity ([place]) 01/25/2008   Qualifier: Diagnosis of  By: Phifer MD, Izora Gala     Review of Systems: Refer to history of present illness and assessment and plans for pertinent review of systems, all others reviewed and negative.  Physical Exam:  There were no vitals filed for this visit. *** Physical Exam  Social History   Socioeconomic History  . Marital status: Single    Spouse name: Not on file  . Number of children: Not on file  . Years of education: 65  . Highest education level: Not on file  Occupational History  . Not on file  Social Needs  . Financial resource strain: Not on file  . Food insecurity    Worry: Not on file    Inability: Not on file  . Transportation needs    Medical: Not on file    Non-medical: Not on file  Tobacco Use  . Smoking status: Never Smoker  . Smokeless tobacco: Never Used  Substance and Sexual Activity  . Alcohol use: No  . Drug use: No  . Sexual activity: Not on file  Lifestyle  . Physical activity    Days per week: Not on file    Minutes per session: Not on file  . Stress: Not on file  Relationships  . Social Herbalist on phone: Not on file    Gets together: Not on file    Attends religious service: Not on file    Active member of club or organization: Not on file    Attends meetings of clubs or organizations: Not on file    Relationship status: Not on file  . Intimate partner violence    Fear of current or ex partner: Not on file    Emotionally abused: Not on file    Physically abused: Not on file    Forced sexual activity: Not on file   Other Topics Concern  . Not on file  Social History Narrative   [name]  September 12, 2009 11:11 AM   Financial assistance approved for 40% discount at [facility] and has [facility] card   [employer]  Oct 02, 2009 2:20 PM   *** No family history on file.  Assessment & Plan:   See Encounters Tab for problem based charting.  Patient {GC/GE:3044014::"discussed with","seen with"} Dr. {NAMES:[id number]::"Butcher","Granfortuna","E. Hoffman","Klima","Mullen","Narendra","Raines","Vincent"}

## 2019-01-24 ENCOUNTER — Encounter: Payer: Medicare Other | Admitting: Internal Medicine

## 2019-01-25 ENCOUNTER — Encounter: Payer: Medicare Other | Admitting: Dietician

## 2019-02-10 ENCOUNTER — Ambulatory Visit (INDEPENDENT_AMBULATORY_CARE_PROVIDER_SITE_OTHER): Payer: Medicare Other | Admitting: Dietician

## 2019-02-10 ENCOUNTER — Ambulatory Visit: Payer: Medicare Other | Admitting: Dietician

## 2019-02-10 ENCOUNTER — Other Ambulatory Visit: Payer: Self-pay

## 2019-02-10 ENCOUNTER — Encounter: Payer: Self-pay | Admitting: Dietician

## 2019-02-10 VITALS — Wt 269.7 lb

## 2019-02-10 DIAGNOSIS — E1142 Type 2 diabetes mellitus with diabetic polyneuropathy: Secondary | ICD-10-CM

## 2019-02-10 DIAGNOSIS — Z6841 Body Mass Index (BMI) 40.0 and over, adult: Secondary | ICD-10-CM

## 2019-02-10 DIAGNOSIS — Z713 Dietary counseling and surveillance: Secondary | ICD-10-CM | POA: Diagnosis not present

## 2019-02-10 NOTE — Progress Notes (Signed)
Medical Nutrition Therapy  Appt start time: 1011 end time:  1100. Total time: 49 minutes Visit # 1 02/10/2019 [name] [id number]  Assessment:  Primary concerns today: weight and blood sugars Michalina brought her meter, she is happy with her weight loss and tolerating the victoza. She thinks her blood sugar is getting better. She did not want to check her blood sugar ( tried 2x this am and got errors) or A1C today. She reports increased stress over the past week with her son's medical illness and this is reason for increased blood sugars- "maybe more fruit than usual" and less sleep . Her reported food intake is appropriate. She is not willing to go to [facility] center for exercise or walk in her neighborhood. Constipation: having problems with constipation sicne starting victoza- 1 BM per week. Has had this prob,em before and has psyllium husk(Metamucil) at home. Has been eating prunes, fruits, veggies and feels she drinks enough water and this has not helped.   Preferred Learning Style: No preference indicated  Learning Readiness: Contemplating/Ready  ANTHROPOMETRICS:Estimated body mass index is 44.88 kg/m as calculated from the following:   Height as of 08/13/18: 5' 5"  (1.651 m).   Weight as of this encounter: 269 lb 11.2 oz (122.3 kg).  WEIGHT HISTORY: Wt Readings from Last 5 Encounters:  02/10/19 269 lb 11.2 oz (122.3 kg)  12/29/18 271 lb 11.2 oz (123.2 kg)  10/20/18 275 lb 12.8 oz (125.1 kg)  08/13/18 278 lb (126.1 kg)  06/07/18 274 lb 3.2 oz (124.4 kg)   SLEEP:less than usual due to stress  MEDICATIONS: victoza 1.2 once a day, metformin,  one a day for women 60 plus  BLOOD SUGAR:meter download- checks 1x/day,  average x 30 days 184m/dl, lowest 141 highest 246 Her meter  7 days average 202   14 day ave- 184   30 days- 190   90 day- 212  Lab Results  Component Value Date   HGBA1C 8.5 (A) 10/20/2018   HGBA1C 8.1 (A) 06/07/2018   HGBA1C 10.4 (A) 03/05/2018   HGBA1C  8.1 (A) 12/08/2017   HGBA1C 7.4 07/14/2017    DIETARY INTAKE: Usual eating pattern includes 2 meals and 2 snacks per day. Everyday foods include veggies, fruits.  Avoided foods/Food Intolerances: none mentioned Dining Out (times/week): 1+ 24-hr recall:  B ( AM): coffee 2 tsp sugar, 2 tsp coffeemate Snk ( AM): banana  L ( PM):1/2 c greens, 1/2 c limas, 1 chicken tender Snk ( PM): sf popsicle/orange D ( PM): 1 corn on cobb, butter, limas, greens chicken tender form [employer] Snk ( PM): sf popsicle Beverages: water, tea,   Estimated daily energy needs for weight loss: ~ 1900 calories  Progress Towards Goal(s):  In progress.   Nutritional Diagnosis:  NB-2.1 Physical inactivity As related to lack of support vs readiness to restart exercise.  As evidenced by her report. .    Intervention:  Nutrition education about meter operation, download interpretation, healthy cooking methods and need for activity, strengthening. . Action Goal: do home exercises at least 3 days a week  Outcome goal: improved weight, blood sugars, strength, ability to get up off the floor Coordination of care: consider increasing victoza to 1.848mdl  Teaching Method Utilized: Visual, Auditory,Hands on Handouts given during visit include:avs, exercise Theraband handout Barriers to learning/adherence to lifestyle change: competing values Demonstrated degree of understanding via:  Teach Back   Monitoring/Evaluation:  Dietary intake, exercise, meter, and body weight in 3 month(s) ( I recommended  sooner but she wanted same day as her doctor) . Debera Lat, RD 02/10/2019 12:53 PM.

## 2019-02-10 NOTE — Patient Instructions (Addendum)
For constipation:  o Drink 64 fl oz or more of water and other low-calorie (10 calories or less per 8 fl oz) beverages  o You can add 1-2 tablespoons per day of Psyllium Husk (Metamucil) or Benefiber, 1 teaspoon at a time, to any fluids you choose  o Add chia seeds or freshly ground flax seeds to yogurt or oatmea  o Eat some prunes, can have a little prune juice  o Eat as much leafy green vegetables as possible (spinach, kale, collards, turnip and mustard greens); they provided a lot of fiber, and are a good source of magnesium as well. Add kale or spinach to smoothies with frozen berries (more fiber) if you do not like to eat greens.  o Other magnesium-rich foods are: legumes (beans and lentils - also great sources of fiber), nuts like almonds and cashews, avocado, yogurt, and milk.  o Over time try to increase intake of vegetables and fruits to 2 or more cups per day. This will probably take 6+ months.   You are doing great eating fruits and veggies!!! Try to eat mostly baked, boiled grilled foods from home.   Please make a follow up in 2-3 months.   Butch Penny [phone]

## 2019-02-14 ENCOUNTER — Ambulatory Visit (INDEPENDENT_AMBULATORY_CARE_PROVIDER_SITE_OTHER): Payer: Medicare Other | Admitting: Internal Medicine

## 2019-02-14 ENCOUNTER — Encounter: Payer: Medicare Other | Admitting: Internal Medicine

## 2019-02-14 ENCOUNTER — Encounter: Payer: Self-pay | Admitting: Internal Medicine

## 2019-02-14 ENCOUNTER — Other Ambulatory Visit: Payer: Self-pay

## 2019-02-14 VITALS — BP 137/80 | HR 93 | Temp 97.9°F | Wt 270.1 lb

## 2019-02-14 DIAGNOSIS — R079 Chest pain, unspecified: Secondary | ICD-10-CM | POA: Diagnosis not present

## 2019-02-14 DIAGNOSIS — Z79899 Other long term (current) drug therapy: Secondary | ICD-10-CM

## 2019-02-14 DIAGNOSIS — Z7984 Long term (current) use of oral hypoglycemic drugs: Secondary | ICD-10-CM | POA: Diagnosis not present

## 2019-02-14 DIAGNOSIS — E1142 Type 2 diabetes mellitus with diabetic polyneuropathy: Secondary | ICD-10-CM | POA: Diagnosis not present

## 2019-02-14 DIAGNOSIS — I10 Essential (primary) hypertension: Secondary | ICD-10-CM

## 2019-02-14 DIAGNOSIS — Z23 Encounter for immunization: Secondary | ICD-10-CM | POA: Diagnosis not present

## 2019-02-14 DIAGNOSIS — Z0000 Encounter for general adult medical examination without abnormal findings: Secondary | ICD-10-CM

## 2019-02-14 LAB — POCT GLYCOSYLATED HEMOGLOBIN (HGB A1C): Hemoglobin A1C: 9.6 % — AB (ref 4.0–5.6)

## 2019-02-14 LAB — GLUCOSE, CAPILLARY: Glucose-Capillary: 135 mg/dL — ABNORMAL HIGH (ref 70–99)

## 2019-02-14 MED ORDER — METFORMIN HCL 1000 MG PO TABS: 1000.0000 mg | ORAL_TABLET | Freq: Two times a day (BID) | ORAL | 2 refills | Status: DC

## 2019-02-14 NOTE — Progress Notes (Signed)
CC: Hypertension, DM type 2, chest pain  HPI:  [name] is a 68 y.o.  with a PMH listed below presenting for hypertension, DM type 2, chest pain.   Please see A&P for status of the patient's chronic medical conditions  Past Medical History:  Diagnosis Date  . DIABETES MELLITUS, TYPE II 03/17/2006       . HYPERLIPIDEMIA 03/17/2006   Qualifier: Diagnosis of  By: Sharlet Salina MD, [place]    . HYPERTENSION 03/17/2006       . Morbid obesity ([place]) 01/25/2008   Qualifier: Diagnosis of  By: Phifer MD, Izora Gala     Review of Systems: Refer to history of present illness and assessment and plans for pertinent review of systems, all others reviewed and negative.  Physical Exam:  Vitals:   02/14/19 1530 02/14/19 1611  BP: (!) 153/74 137/80  Pulse: 96 93  Temp: 97.9 F (36.6 C)   TempSrc: Oral   SpO2: 95%   Weight: 270 lb 1.6 oz (122.5 kg)    Physical Exam  Constitutional: She is oriented to person, place, and time and well-developed, well-nourished, and in no distress.  HENT:  Head: Normocephalic and atraumatic.  Cardiovascular: Normal rate, regular rhythm and normal heart sounds.  Pulmonary/Chest: Effort normal and breath sounds normal. No respiratory distress.  Abdominal: Soft. Bowel sounds are normal. She exhibits no distension.  Musculoskeletal: Normal range of motion.        General: No edema.  Neurological: She is alert and oriented to person, place, and time.  Skin: Skin is warm and dry.  Psychiatric: Mood and affect normal.    Social History   Socioeconomic History  . Marital status: Single    Spouse name: Not on file  . Number of children: Not on file  . Years of education: 65  . Highest education level: Not on file  Occupational History  . Not on file  Social Needs  . Financial resource strain: Not on file  . Food insecurity    Worry: Not on file    Inability: Not on file  . Transportation needs    Medical: Not on file    Non-medical: Not on file   Tobacco Use  . Smoking status: Never Smoker  . Smokeless tobacco: Never Used  Substance and Sexual Activity  . Alcohol use: No  . Drug use: No  . Sexual activity: Not on file  Lifestyle  . Physical activity    Days per week: Not on file    Minutes per session: Not on file  . Stress: Not on file  Relationships  . Social Herbalist on phone: Not on file    Gets together: Not on file    Attends religious service: Not on file    Active member of club or organization: Not on file    Attends meetings of clubs or organizations: Not on file    Relationship status: Not on file  . Intimate partner violence    Fear of current or ex partner: Not on file    Emotionally abused: Not on file    Physically abused: Not on file    Forced sexual activity: Not on file  Other Topics Concern  . Not on file  Social History Narrative   [name]  September 12, 2009 11:11 AM   Financial assistance approved for 40% discount at [facility] and has [facility] card   [employer]  Oct 02, 2009 2:20 PM  No family history on file.  Assessment & Plan:   See Encounters Tab for problem based charting.  Patient discussed with Dr. Dareen Piano

## 2019-02-14 NOTE — Patient Instructions (Addendum)
[name],  It was a pleasure to see you today. Thank you for coming in.   Today we discussed your diabetes. Your A1c was still elevated today, this may be because the metformin was stopped. I have sent in the prescription for the metformin, please restart taking this medication. We may need to increase your Victoza on your next visit. Please make an appointment with your eye doctor.    We also discussed your blood pressure. It was elevated today. We discussed changing your medications however you preferred to work on your diet and exercise. We will repeat your blood pressure on your next visit but we may need to increase some at that time.    I have gotten some labs, I will call you if they are abnormal.   Please return to clinic in 3 months or sooner if needed.   Thank you again for coming in.   [name].

## 2019-02-15 ENCOUNTER — Encounter: Payer: Self-pay | Admitting: *Deleted

## 2019-02-15 LAB — BMP8+ANION GAP
Anion Gap: 22 mmol/L — ABNORMAL HIGH (ref 10.0–18.0)
BUN/Creatinine Ratio: 9 — ABNORMAL LOW (ref 12–28)
BUN: 7 mg/dL — ABNORMAL LOW (ref 8–27)
CO2: 20 mmol/L (ref 20–29)
Calcium: 10.4 mg/dL — ABNORMAL HIGH (ref 8.7–10.3)
Chloride: 93 mmol/L — ABNORMAL LOW (ref 96–106)
Creatinine, Ser: 0.75 mg/dL (ref 0.57–1.00)
GFR calc Af Amer: 93 mL/min/{1.73_m2} (ref >59)
GFR calc non Af Amer: 81 mL/min/{1.73_m2} (ref >59)
Glucose: 144 mg/dL — ABNORMAL HIGH (ref 65–99)
Potassium: 3.8 mmol/L (ref 3.5–5.2)
Sodium: 135 mmol/L (ref 134–144)

## 2019-02-15 LAB — LIPID PANEL
Chol/HDL Ratio: 3.9 ratio (ref 0.0–4.4)
Cholesterol, Total: 219 mg/dL — ABNORMAL HIGH (ref 100–199)
HDL: 56 mg/dL (ref >39)
LDL Chol Calc (NIH): 134 mg/dL — ABNORMAL HIGH (ref 0–99)
Triglycerides: 163 mg/dL — ABNORMAL HIGH (ref 0–149)
VLDL Cholesterol Cal: 29 mg/dL (ref 5–40)

## 2019-02-15 NOTE — Progress Notes (Unsigned)
Dear PCP Your patient is scheduled for a Medicare annual wellness visit with the RN.  Please fill out the following required information. You do not have to address every Medicare Covered Preventative Screenings and Services listed in the chart below but selecte the items most meaningful to your patient and limit the number so that it is manageable for the patient.  To complete, click "Encounter", "Note", then "Edit." Sign the encounter. Please return the form within the next 7 days to [facility].    Things That May Be Affecting Your Health:  Alcohol  Hearing loss  Pain    Depression  Home Safety  Sexual Health   Diabetes  Lack of physical activity  Stress   Difficulty with daily activities  Loneliness  Tiredness   Drug use  Medicines  Tobacco use   Falls  Motor Vehicle Safety  Weight   Food choices  Oral Health  Other    YOUR PERSONALIZED HEALTH PLAN : 1. Schedule your next subsequent Medicare Wellness visit in one year 2. Attend all of your regular appointments to address your medical issues 3. Complete the preventative screenings and services   Annual Wellness Visit   Medicare Covered Preventative Screenings and [place] Men and Women Who How Often Need? Date of Last Service Action  Abdominal Aortic Aneurysm Adults with AAA risk factors Once     Alcohol Misuse and Counseling All Adults Screening once a year if no alcohol misuse. Counseling up to 4 face to face sessions.     Bone Density Measurement  Adults at risk for osteoporosis Once every 2 yrs     Lipid Panel Z13.6 All adults without CV disease Once every 5 yrs     Colorectal Cancer   Stool sample or  Colonoscopy All adults 42 and older   Once every year  Every 10 years     Depression All Adults Once a year  Today   Diabetes Screening Blood glucose, post glucose load, or GTT Z13.1  All adults at risk  Pre-diabetics  Once per year  Twice per year     Diabetes  Self-Management Training All  adults Diabetics 10 hrs first year; 2 hours subsequent years. Requires Copay     Glaucoma  Diabetics  Family history of glaucoma  African Americans 60 yrs +  Hispanic Americans 41 yrs + Annually - requires coppay     Hepatitis C Z72.89 or F19.20  High Risk for HCV  Born between 1945 and 1965  Annually  Once     HIV Z11.4 All adults based on risk  Annually btw ages 75 & 72 regardless of risk  Annually > 65 yrs if at increased risk     Lung Cancer Screening Asymptomatic adults aged 65-77 with 30 pack yr history and current smoker OR quit within the last 15 yrs Annually Must have counseling and shared decision making documentation before first screen     Medical Nutrition Therapy Adults with   Diabetes  Renal disease  Kidney transplant within past 3 yrs 3 hours first year; 2 hours subsequent years     Obesity and Counseling All adults Screening once a year Counseling if BMI 30 or higher  Today   Tobacco Use Counseling Adults who use tobacco  Up to 8 visits in one year     Vaccines Z23  Hepatitis B  Influenza   Pneumonia  Adults   Once  Once every flu season  Two different vaccines separated by  one year     Next Annual Wellness Visit People with Medicare Every year  Today     [place] Women Who How Often Need  Date of Last Service Action  Mammogram  Z12.31 Women over 68 One baseline ages 3-39. Annually ager 40 yrs+     Pap tests All women Annually if high risk. Every 2 yrs for normal risk women     Screening for cervical cancer with   Pap (Z01.419 nl or Z01.411abnl) &  HPV Z11.51 Women aged 66 to 23 Once every 5 yrs     Screening pelvic and breast exams All women Annually if high risk. Every 2 yrs for normal risk women     Sexually Transmitted Diseases  Chlamydia  Gonorrhea  Syphilis All at risk adults Annually for non pregnant females at increased risk         [place] Men Who How Ofter Need  Date of Last Service  Action  Prostate Cancer - DRE & PSA Men over 50 Annually.  DRE might require a copay.     Sexually Transmitted Diseases  Syphilis All at risk adults Annually for men at increased risk

## 2019-02-16 DIAGNOSIS — R079 Chest pain, unspecified: Secondary | ICD-10-CM

## 2019-02-16 HISTORY — DX: Chest pain, unspecified: R07.9

## 2019-02-16 MED ORDER — LIRAGLUTIDE 18 MG/3ML ~~LOC~~ SOPN: 1.2000 mg | PEN_INJECTOR | Freq: Every day | SUBCUTANEOUS | 2 refills | Status: DC

## 2019-02-16 NOTE — Assessment & Plan Note (Addendum)
Patient reports that she has been having these episodes of chest pain, it is an aching pain, located substernaly with no radiation, occurs only at night when she is lying down. No pleuritic. Denies any shortness of breath, nausea, vomiting, sweating, or headaches with the episodes.  She also endorses onset of regurgitation that occur every so often, she thinks she has history of GERD in the past.  We discussed times we can obtain an EKG and start her on a PPI for possible GERD symptoms.  However patient did not want to do anything at this time.  Vital signs are stable at this time however given her history of diabetes, hypertension, and female sex there is always a concern of ACS.  We will continue to discuss with patient the need for further testing. Discussed return precautions if symptoms worsen.

## 2019-02-16 NOTE — Assessment & Plan Note (Signed)
Patient is currently on lisinopril-HCTZ 20-25 mg daily, amlodipine 10 mg daily, Coreg 25 mg twice daily.  She denies any issues taking her medications and denies any lightheadedness, dizziness, or other symptoms.  Blood pressure today initially elevated to 153/74, repeat was improved to 137/80.    -Lisinopril-HCTZ 20-25 mg daily, amlodipine 10 mg daily, and Coreg 25 mg BID -Repeat BMP

## 2019-02-16 NOTE — Assessment & Plan Note (Signed)
Patient received flu vaccine today

## 2019-02-16 NOTE — Assessment & Plan Note (Signed)
Patient is supposed to be on metformin 1000 mg twice daily and Victoza 1.2 mg daily, she reports that she has not been getting the metformin from her pharmacy so she has not been taking this. She does endorse polyuria, polyphagia, polydipsia and decreased energy. She reports that she has been checking her blood sugars at home, The Accu-Chek reading showed an average of 191, highest of 246 and lowest of 141.  Glucose today was 144 and A1c elevated at 9.6.  We discussed that increase from her prior A1c of 8.  We discussed that she should resume her metformin at that we will send in a prescription for this. She reported that she will be going to go get her eye exam done.   -Continue Victoza 1.2 mg daily -Resume metformin 1000 mg twice daily, sent in prescription

## 2019-02-18 NOTE — Progress Notes (Signed)
Internal Medicine Clinic Attending  Case discussed with Dr. Krienke at the time of the visit.  We reviewed the resident's history and exam and pertinent patient test results.  I agree with the assessment, diagnosis, and plan of care documented in the resident's note.    

## 2019-02-18 NOTE — Addendum Note (Signed)
Addended by: Aldine Contes on: 02/18/2019 02:08 PM   Modules accepted: Level of Service

## 2019-02-23 NOTE — Progress Notes (Signed)
Dear PCP Your patient is scheduled for a Medicare annual wellness visit with the RN.  Please fill out the following required information. You do not have to address every Medicare Covered Preventative Screenings and Services listed in the chart below but selecte the items most meaningful to your patient and limit the number so that it is manageable for the patient.  To complete, click "Encounter", "Note", then "Edit." Sign the encounter. Please return the form within the next 7 days to [facility].    Things That May Be Affecting Your Health:  Alcohol  Hearing loss  Pain    Depression  Home Safety  Sexual Health  x Diabetes x Lack of physical activity  Stress   Difficulty with daily activities  Loneliness x Tiredness   Drug use  Medicines  Tobacco use   Falls  Motor Vehicle Safety x Weight   Food choices  Oral Health  Other    YOUR PERSONALIZED HEALTH PLAN : 1. Schedule your next subsequent Medicare Wellness visit in one year 2. Attend all of your regular appointments to address your medical issues 3. Complete the preventative screenings and services   Annual Wellness Visit   Medicare Covered Preventative Screenings and [place] Men and Women Who How Often Need? Date of Last Service Action  Abdominal Aortic Aneurysm Adults with AAA risk factors Once     Alcohol Misuse and Counseling All Adults Screening once a year if no alcohol misuse. Counseling up to 4 face to face sessions.     Bone Density Measurement  Adults at risk for osteoporosis Once every 2 yrs     Lipid Panel Z13.6 All adults without CV disease Once every 5 yrs     Colorectal Cancer   Stool sample or  Colonoscopy All adults 56 and older   Once every year  Every 10 years     Depression All Adults Once a year  Today   Diabetes Screening Blood glucose, post glucose load, or GTT Z13.1  All adults at risk  Pre-diabetics  Once per year  Twice per year     Diabetes  Self-Management Training All  adults Diabetics 10 hrs first year; 2 hours subsequent years. Requires Copay     Glaucoma  Diabetics  Family history of glaucoma  African Americans 84 yrs +  Hispanic Americans 62 yrs + Annually - requires coppay     Hepatitis C Z72.89 or F19.20  High Risk for HCV  Born between 1945 and 1965  Annually  Once     HIV Z11.4 All adults based on risk  Annually btw ages 66 & 16 regardless of risk  Annually > 65 yrs if at increased risk     Lung Cancer Screening Asymptomatic adults aged 31-77 with 30 pack yr history and current smoker OR quit within the last 15 yrs Annually Must have counseling and shared decision making documentation before first screen     Medical Nutrition Therapy Adults with   Diabetes  Renal disease  Kidney transplant within past 3 yrs 3 hours first year; 2 hours subsequent years x    Obesity and Counseling All adults Screening once a year Counseling if BMI 30 or higher x Today   Tobacco Use Counseling Adults who use tobacco  Up to 8 visits in one year     Vaccines Z23  Hepatitis B  Influenza   Pneumonia  Adults   Once  Once every flu season  Two different vaccines separated by  one year x    Next Annual Wellness Visit People with Medicare Every year  Today     [place] Women Who How Often Need  Date of Last Service Action  Mammogram  Z12.31 Women over 62 One baseline ages 24-39. Annually ager 40 yrs+     Pap tests All women Annually if high risk. Every 2 yrs for normal risk women     Screening for cervical cancer with   Pap (Z01.419 nl or Z01.411abnl) &  HPV Z11.51 Women aged 60 to 54 Once every 5 yrs     Screening pelvic and breast exams All women Annually if high risk. Every 2 yrs for normal risk women     Sexually Transmitted Diseases  Chlamydia  Gonorrhea  Syphilis All at risk adults Annually for non pregnant females at increased risk         [place] Men Who How Ofter Need  Date of Last Service  Action  Prostate Cancer - DRE & PSA Men over 50 Annually.  DRE might require a copay.     Sexually Transmitted Diseases  Syphilis All at risk adults Annually for men at increased risk

## 2019-03-03 ENCOUNTER — Other Ambulatory Visit: Payer: Self-pay | Admitting: *Deleted

## 2019-03-03 DIAGNOSIS — E1142 Type 2 diabetes mellitus with diabetic polyneuropathy: Secondary | ICD-10-CM

## 2019-03-03 MED ORDER — GABAPENTIN 300 MG PO CAPS: 300.0000 mg | ORAL_CAPSULE | Freq: Three times a day (TID) | ORAL | 3 refills | Status: DC

## 2019-03-04 ENCOUNTER — Encounter: Payer: Self-pay | Admitting: Internal Medicine

## 2019-03-04 ENCOUNTER — Other Ambulatory Visit: Payer: Self-pay

## 2019-03-04 ENCOUNTER — Telehealth: Payer: Self-pay | Admitting: *Deleted

## 2019-03-04 ENCOUNTER — Ambulatory Visit (INDEPENDENT_AMBULATORY_CARE_PROVIDER_SITE_OTHER): Payer: Medicare Other | Admitting: Internal Medicine

## 2019-03-04 DIAGNOSIS — Z0000 Encounter for general adult medical examination without abnormal findings: Secondary | ICD-10-CM

## 2019-03-04 DIAGNOSIS — E2839 Other primary ovarian failure: Secondary | ICD-10-CM

## 2019-03-04 DIAGNOSIS — Z1231 Encounter for screening mammogram for malignant neoplasm of breast: Secondary | ICD-10-CM

## 2019-03-04 NOTE — Progress Notes (Signed)
This AWV is being conducted by [place] only. The patient was located at home and I was located in [facility]. The patient's identity was confirmed using their DOB and current address. The patient or his/her legal guardian has consented to being evaluated through a telephone encounter and understands the associated risks (an examination cannot be done and the patient may need to come in for an appointment) / benefits (allows the patient to remain at home, decreasing exposure to coronavirus). I personally spent 38 minutes conducting the AWV.  Subjective:   Sonya Lane is a 68 y.o. female who presents for a Medicare Annual Wellness Visit.  The following items have been reviewed and updated today in the appropriate area in the EMR.   Health Risk Assessment  Height, weight, BMI, and BP Visual acuity if needed Depression screen Fall risk / safety level Advance directive discussion Medical and family history were reviewed and updated Updating list of other providers & suppliers Medication reconciliation, including over the counter medicines Cognitive screen Written screening schedule Risk Factor list Personalized health advice, risky behaviors, and treatment advice  Social History   Social History Narrative   Current Social History 03/04/2019        Patient lives alone in a/an apartment which is 2 story/stories. There are steps up to the entrance the patient uses.       Patient's method of transportation is personal car.      The highest level of education was some college.      The patient currently disabled.      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 Luz Mares, BSN, RN-BC             Objective:    Vitals: There were no vitals taken for this visit. Vitals are unable to obtained due to [id number]-[id number] public health emergency  Activities of Daily Living In your present state of health, do you have any difficulty performing the following activities: 03/04/2019 02/14/2019  Hearing? Y [place]? Y N  Difficulty concentrating or making decisions? Y N  Walking or climbing stairs? Y N  Comment - -  Dressing or bathing? N N  Doing errands, shopping? N N  Some recent data might be hidden    Goals Goals    . Blood Pressure < 140/90    . HEMOGLOBIN A1C < 7.0    . LDL CALC < 100    . Weight < 265 lb (120.203 kg)       Fall Risk Fall Risk  03/04/2019 02/14/2019 12/29/2018 08/13/2018 06/07/2018  Falls in the past year? 0 0 0 0 0  Comment - - - - -  Number falls in past yr: - 0 - - -  Injury with Fall? - - - - -  Risk for fall due to : Impaired mobility - - - -  Follow up Education provided;Falls prevention discussed - - - -   CDC Handout on Fall Prevention mailed to patient. Handout on Home Exercise Program, Access  codes [id number] and [id number] with exercise band was given to patient by Clinic Nutritionist at previous visit.   Depression Screen PHQ 2/9 Scores 03/04/2019 12/29/2018 06/07/2018 03/05/2018  PHQ - 2 Score 1 0 0 0  PHQ- 9 Score 5 - 0 -     Cognitive Testing Six-Item Cognitive Screener   "I would like to ask you some questions that ask you to use your memory. I am going to name three objects. Please wait until I say all three words, then repeat them. Remember what they are  because I am going to ask you to name them again in a few minutes. Please repeat these words for me: APPLE-TABLE-PENNY." (Interviewer may repeat names 3 times if necessary but repetition not scored.)  Did patient correctly repeat all three words? Yes - may proceed with screen  What year is this? Correct What month is this? Correct What day of  the week is this? Correct  What were the three objects I asked you to remember? . Apple Correct . Table Correct . Penny Correct  Score one point for each incorrect answer.  A score of 2 or more points warrants additional investigation.  Patient's score 0   Assessment and Plan:     Patient has appt for eye exam on 04/11/2019  During the course of the visit the patient was educated and counseled about appropriate screening and preventive services as documented in the assessment and plan.  The printed AVS was given to the patient and included an updated screening schedule, a list of risk factors, and personalized health advice.        Velora Heckler, RN  03/04/2019

## 2019-03-04 NOTE — Patient Instructions (Addendum)
Things That May Be Affecting Your Health:  Alcohol  Hearing loss  Pain    Depression  Home Safety  Sexual Health  x Diabetes x Lack of physical activity  Stress   Difficulty with daily activities  Loneliness x Tiredness   Drug use  Medicines  Tobacco use   Falls  Motor Vehicle Safety x Weight   Food choices  Oral Health  Other    YOUR PERSONALIZED HEALTH PLAN : 1. Schedule your next subsequent Medicare Wellness visit in one year 2. Attend all of your regular appointments to address your medical issues 3. Complete the preventative screenings and services   Annual Wellness Visit                       Medicare Covered Preventative Screenings and [place] Men and Women Who How Often Need? Date of Last Service Action  Abdominal Aortic Aneurysm Adults with AAA risk factors Once     Alcohol Misuse and Counseling All Adults Screening once a year if no alcohol misuse. Counseling up to 4 face to face sessions.     Bone Density Measurement  Adults at risk for osteoporosis Once every 2 yrs     Lipid Panel Z13.6 All adults without CV disease Once every 5 yrs     Colorectal Cancer   Stool sample or  Colonoscopy All adults 37 and older   Once every year  Every 10 years     Depression All Adults Once a year  Today   Diabetes Screening Blood glucose, post glucose load, or GTT Z13.1  All adults at risk  Pre-diabetics  Once per year  Twice per year     Diabetes  Self-Management Training All adults Diabetics 10 hrs first year; 2 hours subsequent years. Requires Copay     Glaucoma  Diabetics  Family history of glaucoma  African Americans 29 yrs +  Hispanic Americans 51 yrs + Annually - requires coppay     Hepatitis C Z72.89 or F19.20  High Risk for HCV  Born between 1945 and 1965  Annually  Once     HIV Z11.4 All adults based on risk  Annually btw ages 29 & 74 regardless of risk  Annually > 65  yrs if at increased risk     Lung Cancer Screening Asymptomatic adults aged 47-77 with 9 pack yr history and current smoker OR quit within the last 15 yrs Annually Must have counseling and shared decision making documentation before first screen     Medical Nutrition Therapy Adults with   Diabetes  Renal disease  Kidney transplant within past 3 yrs 3 hours first year; 2 hours subsequent years x  Continue meeting with Butch Penny for diabetes and weight management  Obesity and Counseling All adults Screening once a year Counseling if BMI 30 or higher x Today   Tobacco Use Counseling Adults who use tobacco  Up to 8 visits in one year     Vaccines Z23  Hepatitis B  Influenza   Pneumonia  Adults   Once  Once every flu season  Two different vaccines separated by one year x  Due for Pneumovax-23  Next Annual Wellness Visit People with Medicare Every year  Today     Services & Screenings Women Who How Often Need  Date of Last Service Action  Mammogram  Z12.31 Women over 34 One baseline ages 26-39. Annually ager 40 yrs+     Pap  tests All women Annually if high risk. Every 2 yrs for normal risk women     Screening for cervical cancer with   Pap (Z01.419 nl or Z01.411abnl) &  HPV Z11.51 Women aged 59 to 79 Once every 5 yrs     Screening pelvic and breast exams All women Annually if high risk. Every 2 yrs for normal risk women     Sexually Transmitted Diseases  Chlamydia  Gonorrhea  Syphilis All at risk adults Annually for non pregnant females at increased risk         [place] Men Who How Ofter Need  Date of Last Service Action  Prostate Cancer - DRE & PSA Men over 50 Annually.  DRE might require a copay.     Sexually Transmitted Diseases  Syphilis All at risk adults Annually for men at increased risk        Fall Prevention in the Home, Adult Falls can cause injuries. They can happen to people of all ages. There  are many things you can do to make your home safe and to help prevent falls. Ask for help when making these changes, if needed. What actions can I take to prevent falls? General Instructions  Use good lighting in all rooms. Replace any light bulbs that burn out.  Turn on the lights when you go into a dark area. Use night-lights.  Keep items that you use often in easy-to-reach places. Lower the shelves around your home if necessary.  Set up your furniture so you have a clear path. Avoid moving your furniture around.  Do not have throw rugs and other things on the floor that can make you trip.  Avoid walking on wet floors.  If any of your floors are uneven, fix them.  Add color or contrast paint or tape to clearly mark and help you see: ? Any grab bars or handrails. ? First and last steps of stairways. ? Where the edge of each step is.  If you use a stepladder: ? Make sure that it is fully opened. Do not climb a closed stepladder. ? Make sure that both sides of the stepladder are locked into place. ? Ask someone to hold the stepladder for you while you use it.  If there are any pets around you, be aware of where they are. What can I do in the bathroom?      Keep the floor dry. Clean up any water that spills onto the floor as soon as it happens.  Remove soap buildup in the tub or shower regularly.  Use non-skid mats or decals on the floor of the tub or shower.  Attach bath mats securely with double-sided, non-slip rug tape.  If you need to sit down in the shower, use a plastic, non-slip stool.  Install grab bars by the toilet and in the tub and shower. Do not use towel bars as grab bars. What can I do in the bedroom?  Make sure that you have a light by your bed that is easy to reach.  Do not use any sheets or blankets that are too big for your bed. They should not hang down onto the floor.  Have a firm chair that has side arms. You can use this for support while you  get dressed. What can I do in the kitchen?  Clean up any spills right away.  If you need to reach something above you, use a strong step stool that has a grab bar.  Keep electrical cords out of the way.  Do not use floor polish or wax that makes floors slippery. If you must use wax, use non-skid floor wax. What can I do with my stairs?  Do not leave any items on the stairs.  Make sure that you have a light switch at the top of the stairs and the bottom of the stairs. If you do not have them, ask someone to add them for you.  Make sure that there are handrails on both sides of the stairs, and use them. Fix handrails that are broken or loose. Make sure that handrails are as long as the stairways.  Install non-slip stair treads on all stairs in your home.  Avoid having throw rugs at the top or bottom of the stairs. If you do have throw rugs, attach them to the floor with carpet tape.  Choose a carpet that does not hide the edge of the steps on the stairway.  Check any carpeting to make sure that it is firmly attached to the stairs. Fix any carpet that is loose or worn. What can I do on the outside of my home?  Use bright outdoor lighting.  Regularly fix the edges of walkways and driveways and fix any cracks.  Remove anything that might make you trip as you walk through a door, such as a raised step or threshold.  Trim any bushes or trees on the path to your home.  Regularly check to see if handrails are loose or broken. Make sure that both sides of any steps have handrails.  Install guardrails along the edges of any raised decks and porches.  Clear walking paths of anything that might make someone trip, such as tools or rocks.  Have any leaves, snow, or ice cleared regularly.  Use sand or salt on walking paths during winter.  Clean up any spills in your garage right away. This includes grease or oil spills. What other actions can I take?  Wear shoes that: ? Have a low  heel. Do not wear high heels. ? Have rubber bottoms. ? Are comfortable and fit you well. ? Are closed at the toe. Do not wear open-toe sandals.  Use tools that help you move around (mobility aids) if they are needed. These include: ? Canes. ? Walkers. ? Scooters. ? Crutches.  Review your medicines with your doctor. Some medicines can make you feel dizzy. This can increase your chance of falling. Ask your doctor what other things you can do to help prevent falls. Where to find more information  Centers for Disease Control and Prevention, STEADI: https://garcia.biz/  [employer] on Aging: BrainJudge.co.uk Contact a doctor if:  You are afraid of falling at home.  You feel weak, drowsy, or dizzy at home.  You fall at home. Summary  There are many simple things that you can do to make your home safe and to help prevent falls.  Ways to make your home safe include removing tripping hazards and installing grab bars in the bathroom.  Ask for help when making these changes in your home. This information is not intended to replace advice given to you by your health care provider. Make sure you discuss any questions you have with your health care provider. Document Released: 03/15/2009 Document Revised: 09/09/2018 Document Reviewed: 01/01/2017 Elsevier Patient Education  2020 [place] Maintenance, Female Adopting a healthy lifestyle and getting preventive care are important in promoting health and wellness. Ask your health care provider  about:  The right schedule for you to have regular tests and exams.  Things you can do on your own to prevent diseases and keep yourself healthy. What should I know about diet, weight, and exercise? Eat a healthy diet   Eat a diet that includes plenty of vegetables, fruits, low-fat dairy products, and lean protein.  Do not eat a lot of foods that are high in solid fats, added sugars, or sodium. Maintain a healthy  weight Body mass index (BMI) is used to identify weight problems. It estimates body fat based on height and weight. Your health care provider can help determine your BMI and help you achieve or maintain a healthy weight. Get regular exercise Get regular exercise. This is one of the most important things you can do for your health. Most adults should:  Exercise for at least 150 minutes each week. The exercise should increase your heart rate and make you sweat (moderate-intensity exercise).  Do strengthening exercises at least twice a week. This is in addition to the moderate-intensity exercise.  Spend less time sitting. Even light physical activity can be beneficial. Watch cholesterol and blood lipids Have your blood tested for lipids and cholesterol at 70 years of age, then have this test every 5 years. Have your cholesterol levels checked more often if:  Your lipid or cholesterol levels are high.  You are older than 70 years of age.  You are at high risk for heart disease. What should I know about cancer screening? Depending on your health history and family history, you may need to have cancer screening at various ages. This may include screening for:  Breast cancer.  Cervical cancer.  Colorectal cancer.  Skin cancer.  Lung cancer. What should I know about heart disease, diabetes, and high blood pressure? Blood pressure and heart disease  High blood pressure causes heart disease and increases the risk of stroke. This is more likely to develop in people who have high blood pressure readings, are of African descent, or are overweight.  Have your blood pressure checked: ? Every 3-5 years if you are 66-16 years of age. ? Every year if you are 63 years old or older. Diabetes Have regular diabetes screenings. This checks your fasting blood sugar level. Have the screening done:  Once every three years after age 35 if you are at a normal weight and have a low risk for  diabetes.  More often and at a younger age if you are overweight or have a high risk for diabetes. What should I know about preventing infection? Hepatitis B If you have a higher risk for hepatitis B, you should be screened for this virus. Talk with your health care provider to find out if you are at risk for hepatitis B infection. Hepatitis C Testing is recommended for:  Everyone born from [birth year] through 1967.  Anyone with known risk factors for hepatitis C. Sexually transmitted infections (STIs)  Get screened for STIs, including gonorrhea and chlamydia, if: ? You are sexually active and are younger than 70 years of age. ? You are older than 70 years of age and your health care provider tells you that you are at risk for this type of infection. ? Your sexual activity has changed since you were last screened, and you are at increased risk for chlamydia or gonorrhea. Ask your health care provider if you are at risk.  Ask your health care provider about whether you are at high risk for HIV. Your health care  provider may recommend a prescription medicine to help prevent HIV infection. If you choose to take medicine to prevent HIV, you should first get tested for HIV. You should then be tested every 3 months for as long as you are taking the medicine. Pregnancy  If you are about to stop having your period (premenopausal) and you may become pregnant, seek counseling before you get pregnant.  Take 400 to 800 micrograms (mcg) of folic acid every day if you become pregnant.  Ask for birth control (contraception) if you want to prevent pregnancy. Osteoporosis and menopause Osteoporosis is a disease in which the bones lose minerals and strength with aging. This can result in bone fractures. If you are 78 years old or older, or if you are at risk for osteoporosis and fractures, ask your health care provider if you should:  Be screened for bone loss.  Take a calcium or vitamin D supplement to lower  your risk of fractures.  Be given hormone replacement therapy (HRT) to treat symptoms of menopause. Follow these instructions at home: Lifestyle  Do not use any products that contain nicotine or tobacco, such as cigarettes, e-cigarettes, and chewing tobacco. If you need help quitting, ask your health care provider.  Do not use street drugs.  Do not share needles.  Ask your health care provider for help if you need support or information about quitting drugs. Alcohol use  Do not drink alcohol if: ? Your health care provider tells you not to drink. ? You are pregnant, may be pregnant, or are planning to become pregnant.  If you drink alcohol: ? Limit how much you use to 0-1 drink a day. ? Limit intake if you are breastfeeding.  Be aware of how much alcohol is in your drink. In the [place] one drink equals one 12 oz bottle of beer (355 mL), one 5 oz glass of wine (148 mL), or one 1 oz glass of hard liquor (44 mL). General instructions  Schedule regular health, dental, and eye exams.  Stay current with your vaccines.  Tell your health care provider if: ? You often feel depressed. ? You have ever been abused or do not feel safe at home. Summary  Adopting a healthy lifestyle and getting preventive care are important in promoting health and wellness.  Follow your health care provider's instructions about healthy diet, exercising, and getting tested or screened for diseases.  Follow your health care provider's instructions on monitoring your cholesterol and blood pressure. This information is not intended to replace advice given to you by your health care provider. Make sure you discuss any questions you have with your health care provider. Document Released: 12/02/2010 Document Revised: 05/12/2018 Document Reviewed: 05/12/2018 Elsevier Patient Education  2020 [employer].

## 2019-03-04 NOTE — Telephone Encounter (Signed)
Nurse conducting AWV delayed this AM. VM left for pt if OK to postpone AWV today to 11 A or would she rather r/s altogether? Pt asked to call [phone] to let us know which alternative better for her and apologies made for last minute change. Yvonna Alanis, RN, 03/04/19, 9:08A

## 2019-03-07 NOTE — Progress Notes (Signed)
I discussed the AWV findings with the RN who conducted the visit. I was present in the office suite and immediately available to provide assistance and direction throughout the time the service was provided.

## 2019-03-08 NOTE — Progress Notes (Signed)
Internal Medicine Clinic Attending  Case discussed with Dr. Trilby Drummer at the time of the visit.  We reviewed the AWV findings.  I agree with the assessment, diagnosis, and plan of care documented in the AWV note.    Lenice Pressman, MD, PhD

## 2019-04-11 DIAGNOSIS — Z7984 Long term (current) use of oral hypoglycemic drugs: Secondary | ICD-10-CM | POA: Diagnosis not present

## 2019-04-11 DIAGNOSIS — H52203 Unspecified astigmatism, bilateral: Secondary | ICD-10-CM | POA: Diagnosis not present

## 2019-04-11 DIAGNOSIS — H524 Presbyopia: Secondary | ICD-10-CM | POA: Diagnosis not present

## 2019-04-11 DIAGNOSIS — H5213 Myopia, bilateral: Secondary | ICD-10-CM | POA: Diagnosis not present

## 2019-04-11 DIAGNOSIS — E119 Type 2 diabetes mellitus without complications: Secondary | ICD-10-CM | POA: Diagnosis not present

## 2019-04-11 LAB — HM DIABETES EYE EXAM

## 2019-04-22 ENCOUNTER — Other Ambulatory Visit: Payer: Self-pay | Admitting: Nurse Practitioner

## 2019-04-22 ENCOUNTER — Other Ambulatory Visit: Payer: Self-pay | Admitting: Internal Medicine

## 2019-04-22 DIAGNOSIS — Z1231 Encounter for screening mammogram for malignant neoplasm of breast: Secondary | ICD-10-CM

## 2019-05-06 ENCOUNTER — Other Ambulatory Visit: Payer: Self-pay | Admitting: Internal Medicine

## 2019-05-06 DIAGNOSIS — E1142 Type 2 diabetes mellitus with diabetic polyneuropathy: Secondary | ICD-10-CM

## 2019-05-06 MED ORDER — METFORMIN HCL 1000 MG PO TABS: 1000.0000 mg | ORAL_TABLET | Freq: Two times a day (BID) | ORAL | 1 refills | Status: DC

## 2019-05-06 NOTE — Telephone Encounter (Signed)
Needs refill on metFORMIN (GLUCOPHAGE) 1000 MG tablet  ;pt contact [place], [place]

## 2019-05-13 ENCOUNTER — Other Ambulatory Visit: Payer: Self-pay | Admitting: Internal Medicine

## 2019-05-13 DIAGNOSIS — I10 Essential (primary) hypertension: Secondary | ICD-10-CM

## 2019-06-14 ENCOUNTER — Other Ambulatory Visit: Payer: Self-pay | Admitting: Internal Medicine

## 2019-06-14 DIAGNOSIS — E1142 Type 2 diabetes mellitus with diabetic polyneuropathy: Secondary | ICD-10-CM

## 2019-06-14 MED ORDER — LIRAGLUTIDE 18 MG/3ML ~~LOC~~ SOPN: 1.2000 mg | PEN_INJECTOR | Freq: Every day | SUBCUTANEOUS | 1 refills | Status: DC

## 2019-06-14 NOTE — Telephone Encounter (Signed)
Need refill on liraglutide (VICTOZA) 18 MG/3ML SOPN  ;pt contact [place], [place]

## 2019-06-22 ENCOUNTER — Ambulatory Visit
Admission: RE | Admit: 2019-06-22 | Discharge: 2019-06-22 | Disposition: A | Discharge status: Home / Self Care | Payer: Medicare Other | Source: Ambulatory Visit | Attending: Nurse Practitioner | Admitting: Nurse Practitioner

## 2019-06-22 ENCOUNTER — Other Ambulatory Visit: Payer: Self-pay

## 2019-06-22 DIAGNOSIS — Z1231 Encounter for screening mammogram for malignant neoplasm of breast: Secondary | ICD-10-CM

## 2019-06-27 ENCOUNTER — Ambulatory Visit (INDEPENDENT_AMBULATORY_CARE_PROVIDER_SITE_OTHER): Payer: Medicare Other | Admitting: Internal Medicine

## 2019-06-27 ENCOUNTER — Encounter: Payer: Self-pay | Admitting: Internal Medicine

## 2019-06-27 ENCOUNTER — Other Ambulatory Visit: Payer: Self-pay

## 2019-06-27 ENCOUNTER — Encounter: Payer: Self-pay | Admitting: Dietician

## 2019-06-27 VITALS — BP 140/76 | HR 88 | Temp 98.0°F | Wt 262.2 lb

## 2019-06-27 DIAGNOSIS — Z79899 Other long term (current) drug therapy: Secondary | ICD-10-CM

## 2019-06-27 DIAGNOSIS — E1142 Type 2 diabetes mellitus with diabetic polyneuropathy: Secondary | ICD-10-CM | POA: Diagnosis not present

## 2019-06-27 DIAGNOSIS — Z7984 Long term (current) use of oral hypoglycemic drugs: Secondary | ICD-10-CM | POA: Diagnosis not present

## 2019-06-27 DIAGNOSIS — K582 Mixed irritable bowel syndrome: Secondary | ICD-10-CM

## 2019-06-27 DIAGNOSIS — I10 Essential (primary) hypertension: Secondary | ICD-10-CM | POA: Diagnosis not present

## 2019-06-27 LAB — POCT GLYCOSYLATED HEMOGLOBIN (HGB A1C): Hemoglobin A1C: 7.5 % — AB (ref 4.0–5.6)

## 2019-06-27 LAB — GLUCOSE, CAPILLARY: Glucose-Capillary: 197 mg/dL — ABNORMAL HIGH (ref 70–99)

## 2019-06-27 NOTE — Patient Instructions (Signed)
[name],  It was a pleasure to see you today. Thank you for coming in.   Today we discussed your blood pressure, diabetes, and IBS.   Your blood pressure was a little high when we checked it, please keep checking your blood pressures at home and contact us if the top number of your BP remains consistently >140. We may need to adjust your medications if this is the case.   Your diabetes is doing great today! Your A1c is down to 7.5 today, please keep up the good work! Please continue taking the Victoza daily. We will follow up in about 3 months to discuss adjusting your medications.    Please return to clinic in 3 months or sooner if needed.   Thank you again for coming in.   [name].

## 2019-06-27 NOTE — Progress Notes (Signed)
CC: Diabetes, HTN, and IBS  HPI:  [name] is a 69 y.o.  with a PMH listed below presenting for diabetes, HTN, and IBS      Please see A&P for status of the patient's chronic medical conditions  Past Medical History:  Diagnosis Date  . DIABETES MELLITUS, TYPE II 03/17/2006       . HYPERLIPIDEMIA 03/17/2006   Qualifier: Diagnosis of  By: Sharlet Salina MD, [place]    . HYPERTENSION 03/17/2006       . Morbid obesity ([place]) 01/25/2008   Qualifier: Diagnosis of  By: Phifer MD, Izora Gala     Review of Systems: Refer to history of present illness and assessment and plans for pertinent review of systems, all others reviewed and negative.  Physical Exam:  Vitals:   06/27/19 1440 06/27/19 1521  BP: (!) 173/78 140/76  Pulse: 97 88  Temp: 98 F (36.7 C)   TempSrc: Oral   SpO2: 99%   Weight: 262 lb 3.2 oz (118.9 kg)    Physical Exam  Constitutional: She is oriented to person, place, and time and well-developed, well-nourished, and in no distress.  Neck: No thyromegaly present.  Cardiovascular: Normal rate, regular rhythm and normal heart sounds.  Pulmonary/Chest: Effort normal and breath sounds normal. No respiratory distress.  Abdominal: Soft. Bowel sounds are normal. She exhibits no distension.  Musculoskeletal:     Cervical back: Normal range of motion and neck supple.  Neurological: She is alert and oriented to person, place, and time.  Skin: Skin is warm and dry.  Psychiatric: Mood and affect normal.    Social History   Socioeconomic History  . Marital status: Single    Spouse name: Not on file  . Number of children: Not on file  . Years of education: 79  . Highest education level: Not on file  Occupational History  . Occupation: Retired    Comment: Music therapist  . Occupation: Disabled  Tobacco Use  . Smoking status: Never Smoker  . Smokeless tobacco: Never Used  Substance and Sexual Activity  . Alcohol use: No  . Drug use: No  . Sexual activity: Not  Currently  Other Topics Concern  . Not on file  Social History Narrative   Current Social History 03/04/2019        Patient lives alone in a second floor apartment. There are 18 steps with handrails up to the entrance the patient uses.       Patient's method of transportation is personal car.      The highest level of education was some college.      The patient currently disabled.      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 [place], BSN, RN-BC       Social Determinants of Health   Financial Resource Strain:   . Difficulty of Paying Living Expenses: Not on file  Food Insecurity:   . Worried About Charity fundraiser in the Last Year: Not on file  . Ran Out of Food in the Last Year: Not  on file  Transportation Needs:   . Lack of Transportation (Medical): Not on file  . Lack of Transportation (Non-Medical): Not on file  Physical Activity:   . Days of Exercise per Week: Not on file  . Minutes of Exercise per Session: Not on file  Stress:   . Feeling of Stress : Not on file  Social Connections:   . Frequency of Communication with Friends and Family: Not on file  . Frequency of Social Gatherings with Friends and Family: Not on file  . Attends Religious Services: Not on file  . Active Member of Clubs or Organizations: Not on file  . Attends Archivist Meetings: Not on file  . Marital Status: Not on file  Intimate Partner Violence:   . Fear of Current or Ex-Partner: Not on file  . Emotionally Abused: Not on file  . Physically Abused: Not on file  . Sexually Abused: Not on file   Family History  Problem Relation Age of Onset  . Cancer  Mother 71       Ovarian CA  . Diabetes Mother   . Cancer Father        Lung  . Diabetes Sister   . Arthritis Sister   . Varicose Veins Sister   . Diabetes Sister   . Diabetes Brother   . Diabetes Brother     Assessment & Plan:   See Encounters Tab for problem based charting.  Patient discussed with Dr. Rebeca Alert

## 2019-06-28 NOTE — Assessment & Plan Note (Signed)
Patient is currently on Victoza 1.2 mg daily and metformin 1000 mg BID. She reports that her blood sugars have been doing better. She reports that she ran out of strips about 2 weeks ago however now has it. She reports that when her blood sugars are around 130 that she does not feel good, she reporst that she is very hungry and thirsty when this happens, denies any light headedness, dizziness, or visions changes when her blood sugar is low. She does note having some lightheadedness and dizziness when she takes her other medications though. Her glucometer showed an average of 178, high 260 and low 141. She reported concern about the cost of the Victoza, she reports it was around 220 for a 3 month supply and was wondering about other options. She was specifically wondering about Trulicity since her friend was on it and you only have to take it once a week. We discussed that I will send a message to Dr. Maudie Mercury, our pharmacist, to try to find out other options. Discussed that since she already has a 3 month supply and her A1c is doing so well that we will continue her current regimen for now and have her follow up in about 3 months to discuss adjusting medications at that time. She was in agreement with this.   -Continue Victoza 1.2 mg daily -Continue metformin 1000 mg BID -Will send message to Dr. Maudie Mercury to see if other diabetes medications may be covered -RTC in 3 months, possibly switch to Trulicity at that time

## 2019-06-28 NOTE — Progress Notes (Signed)
Internal Medicine Clinic Attending  Case discussed with Dr. Krienke at the time of the visit.  We reviewed the resident's history and exam and pertinent patient test results.  I agree with the assessment, diagnosis, and plan of care documented in the resident's note.  Waverley Krempasky, M.D., Ph.D.  

## 2019-06-28 NOTE — Assessment & Plan Note (Signed)
Patient has a history of IBS, she reports that she still has intermittent episodes of diarrhea and constipation. She reports that she is using a glycerin suppository about 1/week which helps her symptoms. We discussed adding additional medications, such as TCA, however patient reports that she does not want to take any more medications. She states that she would like to stick with the suppository.  -Continue glycerin suppository PRN

## 2019-06-28 NOTE — Assessment & Plan Note (Signed)
Patient is on lisinopril-hctz 20-25 daily, amlodipine 10 mg daily, and coreg 25 mg BID. BP today is elevated at 173/78, repeat was 140/76. She reports that she checks her blood pressures at home, states that it's normally around 128/68. Denies any issues taking her medications. We discussed that since her blood pressure was higher then her goal that she should continue to monitor her blood pressures at home and contact us if is consistently >140. Provided ambulatory BP monitor.

## 2019-07-08 ENCOUNTER — Telehealth: Payer: Self-pay | Admitting: Internal Medicine

## 2019-07-08 MED ORDER — FLUCONAZOLE 150 MG PO TABS: 150.0000 mg | ORAL_TABLET | Freq: Once | ORAL | 0 refills | Status: DC

## 2019-07-08 NOTE — Telephone Encounter (Signed)
Contacted patient regarding Trulicity cost, spoke with Dr. Maudie Mercury who reported that I would need to contact the pharmacy in order to figure this out. She stated that would be fine. She also reported that she has been having symptoms of a yeast infection, thick discharge, itching sensation, and was requesting the yeast medication. She also reported that she has been having a rash under her arms.  Patient is noted to have frequent yeast infections, will send in prescription for Diflucan for now. In regards to her rash, advised her to keep the area clean and dry and put powder to prevent moisture in the area. However if her symptoms are concerning informed her that she will need to make an appointment to further discuss this.

## 2019-08-09 ENCOUNTER — Other Ambulatory Visit: Payer: Self-pay | Admitting: Internal Medicine

## 2019-08-09 NOTE — Telephone Encounter (Signed)
Called pt- stated she has a boil in her vaginal area; requesting refill on Diflucan. Also instructed pt to use warm compresses.

## 2019-08-18 ENCOUNTER — Other Ambulatory Visit: Payer: Self-pay | Admitting: Internal Medicine

## 2019-08-18 DIAGNOSIS — E1142 Type 2 diabetes mellitus with diabetic polyneuropathy: Secondary | ICD-10-CM

## 2019-08-18 NOTE — Telephone Encounter (Signed)
Need refill on gabapentin (NEURONTIN) 300 MG capsule  ;pt contact [place], [place]

## 2019-08-18 NOTE — Telephone Encounter (Signed)
TC to patient, she states [facility] sent all her other meds but did not send gabapentin and told her she need another RX for it. Will forward to PCP. SChaplin, RN,BSN

## 2019-08-19 MED ORDER — GABAPENTIN 300 MG PO CAPS: 300.0000 mg | ORAL_CAPSULE | Freq: Three times a day (TID) | ORAL | 3 refills | Status: DC

## 2019-08-28 ENCOUNTER — Other Ambulatory Visit: Payer: Self-pay | Admitting: Internal Medicine

## 2019-08-28 DIAGNOSIS — E1142 Type 2 diabetes mellitus with diabetic polyneuropathy: Secondary | ICD-10-CM

## 2019-10-18 ENCOUNTER — Other Ambulatory Visit: Payer: Self-pay | Admitting: Internal Medicine

## 2019-10-18 DIAGNOSIS — E1142 Type 2 diabetes mellitus with diabetic polyneuropathy: Secondary | ICD-10-CM

## 2019-10-31 ENCOUNTER — Other Ambulatory Visit: Payer: Self-pay | Admitting: Internal Medicine

## 2019-10-31 DIAGNOSIS — E1142 Type 2 diabetes mellitus with diabetic polyneuropathy: Secondary | ICD-10-CM

## 2019-11-28 ENCOUNTER — Ambulatory Visit (INDEPENDENT_AMBULATORY_CARE_PROVIDER_SITE_OTHER): Payer: Medicare Other | Admitting: Internal Medicine

## 2019-11-28 ENCOUNTER — Encounter: Payer: Self-pay | Admitting: Internal Medicine

## 2019-11-28 VITALS — BP 145/82 | HR 87 | Temp 98.7°F | Ht 65.0 in | Wt 262.7 lb

## 2019-11-28 DIAGNOSIS — I10 Essential (primary) hypertension: Secondary | ICD-10-CM

## 2019-11-28 DIAGNOSIS — T466X5A Adverse effect of antihyperlipidemic and antiarteriosclerotic drugs, initial encounter: Secondary | ICD-10-CM | POA: Diagnosis not present

## 2019-11-28 DIAGNOSIS — E1142 Type 2 diabetes mellitus with diabetic polyneuropathy: Secondary | ICD-10-CM | POA: Diagnosis not present

## 2019-11-28 DIAGNOSIS — G629 Polyneuropathy, unspecified: Secondary | ICD-10-CM | POA: Diagnosis not present

## 2019-11-28 HISTORY — DX: Adverse effect of antihyperlipidemic and antiarteriosclerotic drugs, initial encounter: T46.6X5A

## 2019-11-28 LAB — POCT GLYCOSYLATED HEMOGLOBIN (HGB A1C): Hemoglobin A1C: 7.7 % — AB (ref 4.0–5.6)

## 2019-11-28 LAB — GLUCOSE, CAPILLARY: Glucose-Capillary: 138 mg/dL — ABNORMAL HIGH (ref 70–99)

## 2019-11-28 MED ORDER — TRULICITY 0.75 MG/0.5ML ~~LOC~~ SOAJ: 0.7500 mg | SUBCUTANEOUS | 5 refills | Status: DC

## 2019-11-28 NOTE — Progress Notes (Signed)
   CC: Diabetes  HPI: [name] is a 69 y.o. with the history listed below presenting for follow up of her diabetes.   Past Medical History:  Diagnosis Date  . DIABETES MELLITUS, TYPE II 03/17/2006       . HYPERLIPIDEMIA 03/17/2006   Qualifier: Diagnosis of  By: Sharlet Salina MD, [place]    . HYPERTENSION 03/17/2006       . Morbid obesity ([place]) 01/25/2008   Qualifier: Diagnosis of  By: Phifer MD, Izora Gala     Review of Systems:   Constitutional: Negative for chills and fever.  Respiratory: Negative for shortness of breath.   Cardiovascular: Negative for chest pain and leg swelling.  Gastrointestinal: Negative for abdominal pain, nausea and vomiting.  Neurological: Negative for dizziness and headaches.   Physical Exam:  Vitals:   11/28/19 1525 11/28/19 1612  BP: (!) 160/75 (!) 145/82  Pulse: 94 87  Temp: 98.7 F (37.1 C)   TempSrc: Oral   SpO2: 95%   Weight: 262 lb 11.2 oz (119.2 kg)   Height: 5' 5"  (1.651 m)    Physical Exam Constitutional:      Appearance: Normal appearance.  Cardiovascular:     Rate and Rhythm: Normal rate and regular rhythm.     Pulses: Normal pulses.     Heart sounds: Normal heart sounds.  Pulmonary:     Effort: Pulmonary effort is normal.     Breath sounds: Normal breath sounds.  Abdominal:     General: Abdomen is flat. Bowel sounds are normal.     Palpations: Abdomen is soft.  Musculoskeletal:        General: No swelling. Normal range of motion.     Cervical back: Normal range of motion and neck supple.  Skin:    General: Skin is warm and dry.     Capillary Refill: Capillary refill takes less than 2 seconds.  Neurological:     General: No focal deficit present.     Mental Status: She is alert and oriented to person, place, and time.  Psychiatric:        Mood and Affect: Mood normal.        Behavior: Behavior normal.    Assessment & Plan:   See Encounters Tab for problem based charting.  Patient discussed with Dr. Philipp Ovens

## 2019-11-28 NOTE — Assessment & Plan Note (Signed)
Patient has intolerance to statin medications, has tried pravastatin, simvastatin, and atorvastatin with reported myalgias. Patient does have diabetes and would require statin however given intolerance will hold off on this medication.

## 2019-11-28 NOTE — Patient Instructions (Signed)
[name],  It was a pleasure to see you today. Thank you for coming in.   Today we discussed your diabetes. In regards to this, I have sent in a prescription for Trulicity. If this is not covered then you can return to Victoza, this seems to be working well for you.  Continue taking the metformin daily. Please continue monitoring your blood sugars and bring in your meter on your next visit.   We also discussed your blood perssure. Please continue taking your current medications.   Please return to clinic in 3 months or sooner if needed.   Thank you again for coming in.   [name].

## 2019-11-29 LAB — VITAMIN B12: Vitamin B-12: 538 pg/mL (ref 232–1245)

## 2019-11-29 NOTE — Assessment & Plan Note (Signed)
Patient is currently on Victoza 1.2 mg daily and metformin 1000 mg BID. She denies any issues taking her medications. Does report that the Victoza is sometimes expensive. Her A1c last time was 7.5, today it is 7.7. Her CBGs have been around 211, 189, 128, 132, 160, 194, and 171. She reports that she is feeling okay. She feels like her blood sugars are elevated because she has been eating a lot of fruits lately. She does state that she often has an urge to [place] but doesn't always come out. We discussed that she is still not at goal today however she is close. She was interested in Trulicity in the past since this is a once a day medication. Discussed that we can try this however if this does not work out then she should continue the [place]. She expressed interest in starting insulin since it may be cheaper. Discussed that the Victoza and Trulicity have other benefits including weight loss and cardiac benefits. However if this is not affordable then she may benefit more from insulin. Provided her information about insulin.    -Continue metformin 1000 mg daily -Start Trulicity 8.63 mg weekly, advised to stop Victoza if the Trulicity is afforadable, but to resume Victoza if it's not, advised to contact us  -Vit B12 since pt is on metformin -Provided information about Insulin

## 2019-11-29 NOTE — Assessment & Plan Note (Signed)
Patient is currently on lisinopril-HCTZ 20-5 mg, amlodipine 10 mg daily, Coreg 25 mg twice daily.  Blood pressure today is dated 160/75, repeat was 145/82. Brought in ambulatory BP paper, normally around 250-539 systolic at home. Will hold off on adjusting at this time.

## 2019-11-30 NOTE — Progress Notes (Signed)
Internal Medicine Clinic Attending  Case discussed with Dr. Krienke at the time of the visit.  We reviewed the resident's history and exam and pertinent patient test results.  I agree with the assessment, diagnosis, and plan of care documented in the resident's note.    

## 2019-12-07 ENCOUNTER — Telehealth: Payer: Self-pay | Admitting: *Deleted

## 2019-12-07 ENCOUNTER — Telehealth: Payer: Self-pay | Admitting: Internal Medicine

## 2019-12-07 NOTE — Telephone Encounter (Signed)
Pls contact pt [phone] regarding medicine

## 2019-12-07 NOTE — Telephone Encounter (Signed)
Return pt's call - stated [facility] has been calling her about Trulicity rx, stated it was incomplete.   I called [facility] - talked to Gregary Signs, stated pt is currently on Trulicity and Victoza; and wants to know if pt suppose to be on both or not. According to last ov, pt will start Trulicity and stop Victoza.  Called pt back about the above. Pt stated she did not accept Victoza when [facility] called her and this office needs to cancel Victoza b/c she wants Trulicity.  I called [facility] again - transferred to the pharmacist, Edison Nasuti who asked if pt should be on both Victoza and Trulicity - informed start Trulicity and stop Victoza.

## 2019-12-07 NOTE — Telephone Encounter (Signed)
Hey kelly, can you help this pt, she just got word from insurance that the copays for trulicity and victoza are 1000.00 and 800.00 and some odd change. Needless to say she cannot afford this. Could you please call her and see if there is anything available to help?

## 2019-12-07 NOTE — Telephone Encounter (Signed)
Spoke to patient on phone.  Mailing apps tomorrow for Trulicity and Victoza-explained to pt that if she prefers Trulicity it will be mailed to her home address, victoza gets shipped to the office and she would have to pick up.  Pt expressed interest in having MD take her off meds completely or change.  I advised pt that because she is in her 'donut-hole' any medication that she may be changed to could potentially still have a high copay.  Advised patient to fill out the application and return it to Internal Medicine at her earliest convenience.

## 2019-12-07 NOTE — Telephone Encounter (Signed)
Patient should Start Trulicity and stop Victoza if it is cost effective for her. If the prescription is too expensive, than she should continue taking the Victoza.   Thank you.

## 2019-12-12 NOTE — Telephone Encounter (Signed)
Sonya Lane, Please see the 2 separate telephone notes from 12/07/19 regarding medications.  Would you please call this patient and see if there is something you can assist her with r/t medications?  I would hate to bring her in if you could answer her questions about her medications, as Dr. Marianna Payment already advised on a med change.   Thank you, Higinio Roger, RN,BSN

## 2019-12-12 NOTE — Telephone Encounter (Signed)
I just spoke with the patient. She states that she cannot afford either Victoza or Trulicity since she is in the "Donut Hole" for medicare part D. Butch Penny, do you know of a medication that would be more cost effective for her?  Best regards,  [facility]

## 2019-12-12 NOTE — Telephone Encounter (Signed)
Spoke to patient, she does not think she will qualify for patient assistance and is frustrated she'll be in the doughnut hole again in 6 months if she uses any of the expensive medicines.  Was on Victoza, never Trulicity or Ozempic. Has enough victoza at 1.8 mg daily until 12/22/19. She reprots that her blood sugars high despite increased dose:  194 this am. Asking about starting insulin, discussed side effects, pros and cons of insulin and GLP-RAs.   Sent patient assistance pharmacy technician a note to ask her Ms. Bridge's question about qualifying. Suggested Ms. Lofaso fill out application for Trulicity  if possible as we have a sample right now to help her. She is willing to complete the application if she thought she would qualify. We agreed that I would call her back with pharmacy technician's answer

## 2019-12-12 NOTE — Telephone Encounter (Signed)
Pt sent a My chart message Requesting an appointment.  Called pt and she states , "She was seen on 11/28/2019 but, wants to know if she needs another appointment to discuss her medications."  Pt would like a call back about her Medications vs needing another appointment to discuss.

## 2019-12-12 NOTE — Telephone Encounter (Signed)
Called Sonya Lane, she would like to start Trulicity when her victoza runs out. She agreed to complete the aplication for it and get it back to our pharmacy technician.  Since she is at the highest dose of Victoza and not having side effects or blood sugars in target, consider starting her on 73m Trulicity per week. We can provide samples until the patient assistance arrives.

## 2019-12-13 ENCOUNTER — Other Ambulatory Visit: Payer: Self-pay | Admitting: Internal Medicine

## 2019-12-13 MED ORDER — TRULICITY 3 MG/0.5ML ~~LOC~~ SOAJ: 3.0000 mg | SUBCUTANEOUS | 0 refills | Status: AC

## 2019-12-14 NOTE — Telephone Encounter (Signed)
To pick up samples and drop off patient assistance app for Trulicity on Friday

## 2019-12-18 ENCOUNTER — Other Ambulatory Visit: Payer: Self-pay | Admitting: Internal Medicine

## 2019-12-18 DIAGNOSIS — E1142 Type 2 diabetes mellitus with diabetic polyneuropathy: Secondary | ICD-10-CM

## 2019-12-22 ENCOUNTER — Telehealth: Payer: Self-pay

## 2019-12-22 NOTE — Telephone Encounter (Signed)
FAXED APPLICATION FOR TRULICITY PATIENT ASSISTANCE TO [facility] TODAY.

## 2020-01-04 NOTE — Telephone Encounter (Signed)
Sonya Lane calls to see what happened with her Trulicity application. I gave her an update. She says her blood sugar s are better 130s-150 and she has two weeks left. She agreed to call back in 2 weeks.

## 2020-01-05 NOTE — Telephone Encounter (Signed)
Left a voicemail for patient today letting her know that her application was approved and to be on the look out for a call from [facility]) to set up shipment arrangements for her Trulicity.

## 2020-01-05 NOTE — Telephone Encounter (Signed)
Her enrollment in the [facility] PAP is approved thru 06/01/20.  Her Trulicity fill is being processed by the pharmacy and will be shipped to the patients home address.

## 2020-01-20 ENCOUNTER — Encounter: Payer: Self-pay | Admitting: Internal Medicine

## 2020-01-20 ENCOUNTER — Telehealth: Payer: Self-pay | Admitting: *Deleted

## 2020-01-20 NOTE — Telephone Encounter (Signed)
Gladys informed me and we responded to the fax. Thank you.

## 2020-01-20 NOTE — Telephone Encounter (Signed)
Received fax from [facility] stating Coreg is no longer covered by patient's insurance. Requesting alternative such as Metoprolol 100 mg, atenolol 100 mg, or bisoprolol 10 mg. L. Arian Murley, BSN, RN-BC

## 2020-01-31 ENCOUNTER — Telehealth: Payer: Self-pay | Admitting: Internal Medicine

## 2020-01-31 ENCOUNTER — Other Ambulatory Visit: Payer: Self-pay | Admitting: Internal Medicine

## 2020-01-31 NOTE — Telephone Encounter (Signed)
Pls contact pt regarding medicine she received in mail metoprolol tablet blue 123m  [phone]

## 2020-01-31 NOTE — Telephone Encounter (Signed)
Contacted patient and informed of changes due to insurance issues. Apologized for miscommunication. Advised that metoprolol is similar to Coreg and to inform us if there are any issues taking this medication. Informed to stop taking Coreg. Updated chart to reflect current medications.

## 2020-01-31 NOTE — Telephone Encounter (Signed)
Pt calls upset, she rec'd metoprolol 198m in mail, she [facility] and was told the md called it in. Reviewed medlist, no metoprolol, reviewed last visit note, no metoprolol, pt states she has not been notified of any medicine changes.  As I called [facility] I found 8/20 phone note stating insurance preferred choices.  Will ask that med list be corrected.  Triage will inform pt and offer apology for miscommunication

## 2020-02-15 DIAGNOSIS — K625 Hemorrhage of anus and rectum: Secondary | ICD-10-CM | POA: Diagnosis not present

## 2020-02-15 DIAGNOSIS — Z8371 Family history of colonic polyps: Secondary | ICD-10-CM | POA: Diagnosis not present

## 2020-02-15 DIAGNOSIS — K5909 Other constipation: Secondary | ICD-10-CM | POA: Diagnosis not present

## 2020-02-15 DIAGNOSIS — K760 Fatty (change of) liver, not elsewhere classified: Secondary | ICD-10-CM | POA: Diagnosis not present

## 2020-02-16 ENCOUNTER — Other Ambulatory Visit: Payer: Self-pay | Admitting: Gastroenterology

## 2020-02-16 DIAGNOSIS — K760 Fatty (change of) liver, not elsewhere classified: Secondary | ICD-10-CM

## 2020-02-22 ENCOUNTER — Ambulatory Visit
Admission: RE | Admit: 2020-02-22 | Discharge: 2020-02-22 | Disposition: A | Discharge status: Home / Self Care | Payer: Medicare Other | Source: Ambulatory Visit | Attending: Gastroenterology | Admitting: Gastroenterology

## 2020-02-22 DIAGNOSIS — K760 Fatty (change of) liver, not elsewhere classified: Secondary | ICD-10-CM | POA: Diagnosis not present

## 2020-03-09 ENCOUNTER — Encounter: Payer: Self-pay | Admitting: Student

## 2020-03-09 ENCOUNTER — Ambulatory Visit (INDEPENDENT_AMBULATORY_CARE_PROVIDER_SITE_OTHER): Payer: Medicare Other | Admitting: Student

## 2020-03-09 ENCOUNTER — Other Ambulatory Visit: Payer: Self-pay

## 2020-03-09 VITALS — BP 143/72 | HR 93 | Temp 98.1°F | Ht 65.0 in | Wt 262.6 lb

## 2020-03-09 DIAGNOSIS — I10 Essential (primary) hypertension: Secondary | ICD-10-CM

## 2020-03-09 DIAGNOSIS — R631 Polydipsia: Secondary | ICD-10-CM

## 2020-03-09 DIAGNOSIS — G629 Polyneuropathy, unspecified: Secondary | ICD-10-CM

## 2020-03-09 DIAGNOSIS — E1142 Type 2 diabetes mellitus with diabetic polyneuropathy: Secondary | ICD-10-CM

## 2020-03-09 DIAGNOSIS — L304 Erythema intertrigo: Secondary | ICD-10-CM

## 2020-03-09 DIAGNOSIS — R3589 Other polyuria: Secondary | ICD-10-CM

## 2020-03-09 DIAGNOSIS — E785 Hyperlipidemia, unspecified: Secondary | ICD-10-CM | POA: Diagnosis not present

## 2020-03-09 DIAGNOSIS — Z6841 Body Mass Index (BMI) 40.0 and over, adult: Secondary | ICD-10-CM

## 2020-03-09 DIAGNOSIS — G59 Mononeuropathy in diseases classified elsewhere: Secondary | ICD-10-CM

## 2020-03-09 LAB — POCT GLYCOSYLATED HEMOGLOBIN (HGB A1C): Hemoglobin A1C: 7.2 % — AB (ref 4.0–5.6)

## 2020-03-09 LAB — GLUCOSE, CAPILLARY: Glucose-Capillary: 145 mg/dL — ABNORMAL HIGH (ref 70–99)

## 2020-03-09 MED ORDER — FLUCONAZOLE 150 MG PO TABS: ORAL_TABLET | ORAL | 0 refills | Status: DC

## 2020-03-09 NOTE — Progress Notes (Signed)
7.2 °

## 2020-03-09 NOTE — Patient Instructions (Signed)
Thank you, [name] for allowing [place] to provide your care today. Today we discussed your diabetes and rash under your breast. We plan to start you on a new diabetes medication once you ran out of your trulicity. We also sent you an antifungal medication to take to clear up the rash under your breast. Make sure to keep that area clean and dry.  I have ordered the following labs for you:   Lab Orders     Glucose, capillary     POC Hbg A1C   I will call if any are abnormal. All of your labs can be accessed through "My Chart".   I have ordered the following medication/changed the following medications:  1. Fluconazole (Diflucan) 150 mg by mouth for 1 dose  Please follow-up in 3 months  Should you have any questions or concerns please call the internal medicine clinic at [phone].    Linwood Dibbles, MD, MPH [facility]   My Chart Access: [email or web address]   If you have not already done so, please get your COVID 19 vaccine  To schedule an appointment for a COVID vaccine choice any of the following: Go to [email or web address]   Go to [email or web address]                  Call [phone]                                     Call [phone] and select Option 2

## 2020-03-10 ENCOUNTER — Encounter: Payer: Self-pay | Admitting: Student

## 2020-03-10 MED ORDER — AMLODIPINE BESYLATE 10 MG PO TABS: 10.0000 mg | ORAL_TABLET | Freq: Every day | ORAL | 1 refills | Status: DC

## 2020-03-10 MED ORDER — METOPROLOL TARTRATE 100 MG PO TABS: 100.0000 mg | ORAL_TABLET | Freq: Two times a day (BID) | ORAL | 2 refills | Status: DC

## 2020-03-10 MED ORDER — GABAPENTIN 300 MG PO CAPS: 600.0000 mg | ORAL_CAPSULE | Freq: Three times a day (TID) | ORAL | 2 refills | Status: DC

## 2020-03-10 MED ORDER — LISINOPRIL-HYDROCHLOROTHIAZIDE 20-25 MG PO TABS: 1.0000 | ORAL_TABLET | Freq: Every day | ORAL | 1 refills | Status: DC

## 2020-03-10 NOTE — Assessment & Plan Note (Signed)
Patient continues to have worsening neuropathic pain in her feet. States the pain is worse when she walks. Patient's foot looks fine with no diabetic ulcers or decreased sensation to touch. Will increase Gabapentin dose and titrate up as needed for pain control.  Plan: --Increase Gabapentin 300 mg three times daily to 600 mg three times daily --Yearly diabetic foot exams

## 2020-03-10 NOTE — Assessment & Plan Note (Addendum)
Patient has an A1c of 7.2% today, down from 7.7% three months ago. Her blood glucose meter shows a low of 132 and a high of 251 with most of her readings between 140 and 180. Patient states she has removed sugar from diet except she still eats fruits and put sugar in her coffee. She is currently on metformin and Trulicity. States her free sample of Trulicity will finish at the end of the year so she would like a cheaper alternative rather than start insulin. Considering patient is moving toward glycemic control, will keep patient on Trulicity and transition to a glipizide, a much cheaper alternative.    Plan:  --Continue metformin 1000 mg daily --Continue Trulicity 1.21 mg weekly --Start glipizide 2.5 mg daily and titrate up based on blood sugar once Trulicity pens are finished. --Follow up for A1c check in 3 months --Consider starting insulin if glipizide does not stabilize blood sugar.

## 2020-03-10 NOTE — Progress Notes (Signed)
   CC: Diabetes follow up  HPI:  [name] is a 69 y.o. female with PMH of DM, HTN, and  HLD who presents for a follow up appt. Endorse polydipsia, polyuria and feet pain but denies any headaches, dizziness or blurry vision.  Please see problem based charting for evaluation, assessment and plan.  Past Medical History:  Diagnosis Date  . DIABETES MELLITUS, TYPE II 03/17/2006       . HYPERLIPIDEMIA 03/17/2006   Qualifier: Diagnosis of  By: Sharlet Salina MD, [place]    . HYPERTENSION 03/17/2006       . Morbid obesity ([place]) 01/25/2008   Qualifier: Diagnosis of  By: Phifer MD, Izora Gala     Review of Systems:  All ROS negative otherwise as stated in the HPI  Physical Exam:  General: Pleasant obese elderly woman. No acute distress. Well nourished, well developed. Head: Normocephalic, atraumatic Cardiac: RRR. No murmurs, rubs or gallops. S1, S2. No lower extremities edema Respiratory: Lungs CTAB. No wheezing or crackles. No increased WOB Abdominal: Soft, symmetric and non tender. Normal bowel sounds Skin: Warm. Erythematous rash underneath both breast covered with powder. Extremities: Atraumatic. Full ROM. Pulse palpable. Neuro: A&O x 3. Moves all extremities Psych: Appropriate mood and affect. Normal judgement  Vitals:   03/09/20 1336 03/09/20 1413  BP: (!) 162/77 (!) 143/72  Pulse: 96 93  Temp: 98.1 F (36.7 C)   TempSrc: Oral   SpO2: 98%   Weight: 262 lb 9.6 oz (119.1 kg)   Height: 5' 5"  (1.651 m)     Assessment & Plan:   See Encounters Tab for problem based charting.  Patient seen with Dr. Louann Liv, MD, MPH

## 2020-03-10 NOTE — Assessment & Plan Note (Addendum)
Patient reports an inflammatory rash underneath her breast. States she has had this before and she was given a medication for it and this medication helped improve the rash. The rash is back again and she has been trying to keep it dry. She applies a powder that is made out of cornstarch with vitamin E on the rash daily. Presentation is due to chronic candidal infection in the setting of risk factors such as obesity and diabetes.   Plan: --Fluconazole 150 mg x1 dose --Advised to keep the area dry and expose it to air as much as possible.  --Consider Nystatin cream if patient can afford it (Currently in  donut hole)

## 2020-03-10 NOTE — Assessment & Plan Note (Signed)
BP stable in clinic. Patient states she will be out of her medication soon so a refill has been sent. Continue current BP regimen.

## 2020-03-12 NOTE — Progress Notes (Signed)
Internal Medicine Clinic Attending  I saw and evaluated the patient.  I personally confirmed the key portions of the history and exam documented by Dr. Coy Saunas and I reviewed pertinent patient test results.  The assessment, diagnosis, and plan were formulated together and I agree with the documentation in the resident's note, though recommend nystatin powder rather than cream preparation for candidal intertrigo.  Maintaining dry environment beneath breasts will hasten healing.

## 2020-04-04 DIAGNOSIS — Z1159 Encounter for screening for other viral diseases: Secondary | ICD-10-CM | POA: Diagnosis not present

## 2020-04-09 DIAGNOSIS — D124 Benign neoplasm of descending colon: Secondary | ICD-10-CM | POA: Diagnosis not present

## 2020-04-09 DIAGNOSIS — K648 Other hemorrhoids: Secondary | ICD-10-CM | POA: Diagnosis not present

## 2020-04-09 DIAGNOSIS — K635 Polyp of colon: Secondary | ICD-10-CM | POA: Diagnosis not present

## 2020-04-09 DIAGNOSIS — Z86010 Personal history of colonic polyps: Secondary | ICD-10-CM | POA: Diagnosis not present

## 2020-04-10 DIAGNOSIS — H524 Presbyopia: Secondary | ICD-10-CM | POA: Diagnosis not present

## 2020-04-10 DIAGNOSIS — H52203 Unspecified astigmatism, bilateral: Secondary | ICD-10-CM | POA: Diagnosis not present

## 2020-04-10 DIAGNOSIS — H2513 Age-related nuclear cataract, bilateral: Secondary | ICD-10-CM | POA: Diagnosis not present

## 2020-04-10 DIAGNOSIS — E1136 Type 2 diabetes mellitus with diabetic cataract: Secondary | ICD-10-CM | POA: Diagnosis not present

## 2020-04-10 DIAGNOSIS — H25013 Cortical age-related cataract, bilateral: Secondary | ICD-10-CM | POA: Diagnosis not present

## 2020-04-10 LAB — HM DIABETES EYE EXAM

## 2020-04-11 DIAGNOSIS — D124 Benign neoplasm of descending colon: Secondary | ICD-10-CM | POA: Diagnosis not present

## 2020-04-11 DIAGNOSIS — K635 Polyp of colon: Secondary | ICD-10-CM | POA: Diagnosis not present

## 2020-07-19 ENCOUNTER — Encounter: Payer: Self-pay | Admitting: Student

## 2020-07-19 ENCOUNTER — Encounter: Payer: Self-pay | Admitting: Dietician

## 2020-07-19 ENCOUNTER — Ambulatory Visit (INDEPENDENT_AMBULATORY_CARE_PROVIDER_SITE_OTHER): Payer: Medicare Other | Admitting: Student

## 2020-07-19 VITALS — BP 136/78 | HR 89 | Temp 98.3°F | Wt 265.0 lb

## 2020-07-19 DIAGNOSIS — G59 Mononeuropathy in diseases classified elsewhere: Secondary | ICD-10-CM | POA: Diagnosis not present

## 2020-07-19 DIAGNOSIS — Z23 Encounter for immunization: Secondary | ICD-10-CM | POA: Diagnosis not present

## 2020-07-19 DIAGNOSIS — I10 Essential (primary) hypertension: Secondary | ICD-10-CM | POA: Diagnosis not present

## 2020-07-19 DIAGNOSIS — K582 Mixed irritable bowel syndrome: Secondary | ICD-10-CM | POA: Diagnosis not present

## 2020-07-19 DIAGNOSIS — E1142 Type 2 diabetes mellitus with diabetic polyneuropathy: Secondary | ICD-10-CM

## 2020-07-19 DIAGNOSIS — E782 Mixed hyperlipidemia: Secondary | ICD-10-CM

## 2020-07-19 LAB — POCT GLYCOSYLATED HEMOGLOBIN (HGB A1C): Hemoglobin A1C: 7.5 % — AB (ref 4.0–5.6)

## 2020-07-19 LAB — GLUCOSE, CAPILLARY: Glucose-Capillary: 202 mg/dL — ABNORMAL HIGH (ref 70–99)

## 2020-07-19 MED ORDER — LISINOPRIL-HYDROCHLOROTHIAZIDE 20-25 MG PO TABS: 1.0000 | ORAL_TABLET | Freq: Every day | ORAL | 3 refills | Status: DC

## 2020-07-19 MED ORDER — AMLODIPINE BESYLATE 10 MG PO TABS: 10.0000 mg | ORAL_TABLET | Freq: Every evening | ORAL | 3 refills | Status: DC

## 2020-07-19 MED ORDER — CARVEDILOL 25 MG PO TABS: 25.0000 mg | ORAL_TABLET | Freq: Two times a day (BID) | ORAL | 3 refills | Status: DC

## 2020-07-19 MED ORDER — LINACLOTIDE 290 MCG PO CAPS: 290.0000 ug | ORAL_CAPSULE | Freq: Every day | ORAL | 3 refills | Status: DC

## 2020-07-19 MED ORDER — ROSUVASTATIN CALCIUM 5 MG PO TABS: 5.0000 mg | ORAL_TABLET | Freq: Every day | ORAL | 3 refills | Status: DC

## 2020-07-19 MED ORDER — GABAPENTIN 300 MG PO CAPS: 300.0000 mg | ORAL_CAPSULE | Freq: Three times a day (TID) | ORAL | 3 refills | Status: DC

## 2020-07-19 MED ORDER — CANAGLIFLOZIN 100 MG PO TABS: 100.0000 mg | ORAL_TABLET | Freq: Every day | ORAL | 3 refills | Status: DC

## 2020-07-19 NOTE — Assessment & Plan Note (Signed)
Patient's blood pressure 136/78 in clinic today. Patient has difficulty reciting the medications that she is prescribed for this chronic condition. She reports that she has been taking amlodipine 33m every night. She did not seem familiar with her lisinopril-HCTZ 20-259mdaily, however she feels that she likely has been taking this medication daily. She reports that she has continued to receive carvedilol 2568mwice daily in the mail despite her current medication list showing metoprolol 100m16mily. Per chart review, it seems that patient's carvedilol was no longer covered by her insurance and was switched to metoprolol, however patient has continued to receive both medications monthly in the mail -Continue amlodipine 10mg25m-day supply x3 refills) -Continue lisinopril-HCTZ 20-25mg 22mday supply x3 refills) -Continue carvedilol 25mg t60m daily (90-day supply x3 refills) -Remove metoprolol 100mg tw41mdaily from medication list -BMP today -Referral to clinical pharmacist

## 2020-07-19 NOTE — Assessment & Plan Note (Signed)
Patient with history of hyperlipidemia with statin intolerance. Per chart review, patient was previously on pravastatin which was switched to atorvastatin in 2019. Patient does not recall restarting atorvastatin or any myopathy associated with this medication. Patient would benefit from lipid panel today and starting a lipid-lowering agent at a low dose. -Crestor 49m daily -Lipid panel today -Referral to clinical pharmacist

## 2020-07-19 NOTE — Assessment & Plan Note (Signed)
HbA1c 7.5 today. Patient reports that she has been taking metformin 1079m twice daily and trulicity 08.37RPweekly. She reports that she has a significant fear of needles and wishes to stop the Trulicity, she also is very concerned of potential adverse effects. Review of patient's medication list does not show trulicity as an active medication. At her last visit, patient was planned to start glipizide which is not on her medication list. She was under the impression she was taking as a combination pill of glipizide and metformin. Per chart review, patient was taking metformin-glipizide over a year ago which was discontinued due to concern for tinnitus. -Continue metformin 10050mtwice daily -Start canagliflozin 10037maily -Refrain from prescribing glipizide at this time -Discontinue trulicity 0.73.96UGekly -HbA1c in 3 months -Referral to clinical pharmacist

## 2020-07-19 NOTE — Assessment & Plan Note (Signed)
-  Refilled linzess

## 2020-07-19 NOTE — Patient Instructions (Addendum)
Ms. Ames,  It was a pleasure meeting you in clinic today.  For your hypertension (high blood pressure): -Continue taking amlodipine 84m nightly -Continue taking lisinopril-HCTZ 20-279mdaily -Continue taking carvedilol 2518mwice daily -We will also collect a basic metabolic panel to assess your kidney function and electrolytes  For your diabetes (high blood sugars): -Continue taking metformin 1000m10mice daily -Start taking invokana 100mg85mly -Stop taking trulicity 0.66m9.32TFly -I have modified your prescription for gabapentin to be 300mg 10me times daily  For your hyperlipidemia (high cholesterol): -Start taking Crestor 5mg da1m -We will also collect a lipid profile to assess your cholesterol levels  For your irritable bowel syndrome (constipation and diarrhea): -I have refilled your linzess 290mcg d88m  For all these medications: -I have sent a referral to our clinical pharmacist  Sincerely, Dr. Kadence Mikkelson Paulla Dolly

## 2020-07-19 NOTE — Assessment & Plan Note (Signed)
Patient reports ongoing peripheral neuropathy. She reports that her pharmacy has been sending her a "double dose of gabapentin" every month which costs more than her single dose prescription. Per chart review, patient had gabapentin increased from 379m to 6028mthree times daily due to ongoing neuropathy. She reports that she does not wish to take the higher dose and is satisfied with 30066mhree times daily. -Modified prescription to gabapentin 300m68mree times daily to reflect how patient is taking medication

## 2020-07-19 NOTE — Progress Notes (Signed)
   CC: HTN, T2DM, HLD follow-up  HPI:  [name] is a 70 y.o. female with past medical history significant for HTN, T2DM and HLD who presents to clinic for follow-up. Refer to problem list for charting of this encounter.  Past Medical History:  Diagnosis Date  . DIABETES MELLITUS, TYPE II 03/17/2006       . HYPERLIPIDEMIA 03/17/2006   Qualifier: Diagnosis of  By: Sharlet Salina MD, [place]    . HYPERTENSION 03/17/2006       . Morbid obesity ([place]) 01/25/2008   Qualifier: Diagnosis of  By: Phifer MD, Izora Gala     Review of Systems:  Denies chest pain, shortness of breath, abdominal pain, nausea, vomiting, diarrhea, fevers, chills.  Physical Exam:  Vitals:   07/19/20 1008 07/19/20 1011  BP: (!) 118/104 136/78  Pulse: 93 89  Temp: 98.3 F (36.8 C)   TempSrc: Oral   SpO2: 98%   Weight: 265 lb (120.2 kg)    Physical Exam Constitutional:      General: She is not in acute distress.    Appearance: She is obese.  Cardiovascular:     Rate and Rhythm: Normal rate and regular rhythm.     Pulses: Normal pulses.     Heart sounds: Normal heart sounds.  Pulmonary:     Effort: Pulmonary effort is normal. No respiratory distress.     Breath sounds: Normal breath sounds.  Abdominal:     General: Abdomen is flat. Bowel sounds are normal.     Palpations: Abdomen is soft.     Tenderness: There is no abdominal tenderness.  Neurological:     General: No focal deficit present.     Mental Status: She is alert. Mental status is at baseline.  Psychiatric:        Mood and Affect: Mood normal.        Behavior: Behavior normal.      Assessment & Plan:   See Encounters Tab for problem based charting.  Patient seen with Dr. Dareen Piano

## 2020-07-20 LAB — BMP8+ANION GAP
Anion Gap: 20 mmol/L — ABNORMAL HIGH (ref 10.0–18.0)
BUN/Creatinine Ratio: 14 (ref 12–28)
BUN: 10 mg/dL (ref 8–27)
CO2: 23 mmol/L (ref 20–29)
Calcium: 10.6 mg/dL — ABNORMAL HIGH (ref 8.7–10.3)
Chloride: 97 mmol/L (ref 96–106)
Creatinine, Ser: 0.74 mg/dL (ref 0.57–1.00)
GFR calc Af Amer: 94 mL/min/{1.73_m2} (ref >59)
GFR calc non Af Amer: 82 mL/min/{1.73_m2} (ref >59)
Glucose: 127 mg/dL — ABNORMAL HIGH (ref 65–99)
Potassium: 4.1 mmol/L (ref 3.5–5.2)
Sodium: 140 mmol/L (ref 134–144)

## 2020-07-20 LAB — LIPID PANEL
Chol/HDL Ratio: 4.3 ratio (ref 0.0–4.4)
Cholesterol, Total: 240 mg/dL — ABNORMAL HIGH (ref 100–199)
HDL: 56 mg/dL (ref >39)
LDL Chol Calc (NIH): 160 mg/dL — ABNORMAL HIGH (ref 0–99)
Triglycerides: 132 mg/dL (ref 0–149)
VLDL Cholesterol Cal: 24 mg/dL (ref 5–40)

## 2020-07-21 ENCOUNTER — Other Ambulatory Visit: Payer: Self-pay | Admitting: Internal Medicine

## 2020-07-21 DIAGNOSIS — E1142 Type 2 diabetes mellitus with diabetic polyneuropathy: Secondary | ICD-10-CM

## 2020-07-23 NOTE — Progress Notes (Signed)
Internal Medicine Clinic Attending  Case discussed with Dr. Johnson  At the time of the visit.  We reviewed the resident's history and exam and pertinent patient test results.  I agree with the assessment, diagnosis, and plan of care documented in the resident's note.  

## 2020-07-24 ENCOUNTER — Ambulatory Visit: Payer: Medicare Other | Admitting: Pharmacist

## 2020-07-24 ENCOUNTER — Telehealth: Payer: Self-pay

## 2020-07-24 DIAGNOSIS — Z0000 Encounter for general adult medical examination without abnormal findings: Secondary | ICD-10-CM

## 2020-07-24 NOTE — Patient Instructions (Addendum)
Sonya Lane it was a pleasure seeing you today.   Today we reviewed all of the medications you are currently taking. Included is an updated medication list. Please continue taking all medications as prescribed on this list.  To help you remember to take your medications:  - Will place pills in spot that is easy to remember.  If you have any questions please call the clinic and ask to speak with me.  Follow-up with me via telephone when you decide on price of Invokana. My direct number is [phone].

## 2020-07-24 NOTE — Telephone Encounter (Signed)
Ms. Dettmann called back to let me know the Invokana wasn't covered on her insurance, but she had already taken care of the patient assistance paperwork while in the office with Dr. Georgina Peer. She was told an alternative may be covered but if not we can move forward with assistance. She also spoke with her [facility]) about the Linzess, which is costing her $226 for a 90 day supply. They told her alternatives were available but I told her I would speak to Dr. Georgina Peer about those options or we could do paperwork for assistance on that medication as well.  I let her know Dr. Georgina Peer or I will reach back out to her about each medication.

## 2020-07-24 NOTE — Telephone Encounter (Signed)
Received following fax message from [place]:  Invokana Tab 1657m is not covered.  Please send RX for covered alternatives (Jardiance 129mtab or Farxiga [id number]) or submit a PA for Invokana.  Will route to red team, as well as, Dr. KelGeorgina Peert had appt today w/ Dr. KelGeorgina Peero advise. Thank you, SChaplin, RN,BSN

## 2020-07-24 NOTE — Progress Notes (Signed)
S/O:   Patient arrives in good spirits.  Presents for medication management. Patient referred by Drs. Wynetta Emery and Queets per appt on 07/19/20.  Patient states she uses pill organizer to help her remember to take her medications. She still sometimes forgets if she changes up her routine, which happens about 1-2x/weekly. She has concerns regarding her medications causing dizziness, but cannot attribute it to any particular medicine and states "the medications that mention dizziness as a side effect are the ones I believe to be causing dizziness." Patient then goes on to state that this could also be an age-related problem. She has not received her newly prescribed medications from last appointment.   Medication Adherence Questionnaire (A score of 2 or more points indicates risk for nonadherence)  Do you know what each of your medicines is for? 1 (1 point if no)  Do you ever have trouble remembering to take your medicine? 2 (2 points if yes)  Do you ever not take a medicine because you feel you do not need it?  0 (1 point if yes)  Do you think that any of your medicines is not helping you? 1 (1 point if yes) Lisinopril   Do you have any physical problems such as vision loss that keep you from taking your medicines as prescribed?  2 (2 points if yes)  Do you think any of your medicine is causing a side effect? 1 (1 point if yes)  Do you know the names of ALL of your medicines? 0 (1 point if no)  Do you think that you need ALL of your medicines? 0 (1 point if no)  In the past 6 months, have you missed getting a refill or a new prescription filled on time? 0 (1 point if yes)  How often do you miss taking a dose of medicine?  3 Never (0 points), 1 or 2 times a month (0 points), 1 time a week (2 points), 2 or more times a week (3 points).   TOTAL SCORE 10/14    A/P: 1. Medication Adherence: Patient has known adherence challenges based on elevated score from questionnaire. Barriers include:  forgetfulness, concern for more harm than good, physical barriers such as cost. Offered patient several options to help her remember to take her medications including providing an additional pill organizer, having her medications prepackaged, and setting alarms on her phone. Patient declined any additional help at this time. Extensively discussed benefits of medications and patient has no further medication concerns. Began PAP for Jardiance through [facility] as patient should qualify for assistance.  2. Medication Reconciliation: medication list reviewed and updated. Patient was provided with a printed medication list.    Written patient instructions provided.  Total time in face to face counseling 45 minutes.   Follow up in PCP at next scheduled visit.

## 2020-07-25 MED ORDER — EMPAGLIFLOZIN 10 MG PO TABS
10.0000 mg | ORAL_TABLET | Freq: Every day | ORAL | 0 refills | Status: DC
Start: 2020-07-25 — End: 2020-10-31

## 2020-07-25 NOTE — Telephone Encounter (Signed)
Dr. Wynetta Emery can send in rx for Jardiance 103m to see how much it will cost but I suspect it will be costly so I will continue moving forward with PAP for Jardiance in the meantime

## 2020-07-25 NOTE — Telephone Encounter (Signed)
Should I call her pharmacy to see about a PA for the [place], & other preferred drugs, or try for the assistance?

## 2020-07-25 NOTE — Telephone Encounter (Signed)
Sounds good. I'll reach out to her.

## 2020-07-31 ENCOUNTER — Telehealth: Payer: Self-pay

## 2020-07-31 DIAGNOSIS — K582 Mixed irritable bowel syndrome: Secondary | ICD-10-CM

## 2020-07-31 NOTE — Telephone Encounter (Signed)
Where should Dr Sharon Seller send new Linzess rx?

## 2020-07-31 NOTE — Telephone Encounter (Signed)
I can send in a much smaller order. Where would she like me to send the Rx?

## 2020-07-31 NOTE — Telephone Encounter (Signed)
Patient returned phone call regarding patient assistance for Jardiance & Linzess.  Jardiance application will be faxed to company today or tomorrow.  Patient wants Linzess application to be mailed to her home so she can sign and complete. Will mail that app today or tomorrow as well.

## 2020-07-31 NOTE — Telephone Encounter (Signed)
Return pt's call - states Linzess is costing $300.00; see pharmacy encounter from today. Pt states she does not have to take Linzess everyday only 2-3 times a week. She wants to know if the rx can be changed?

## 2020-07-31 NOTE — Telephone Encounter (Signed)
Pls contact pt [phone]

## 2020-08-01 NOTE — Telephone Encounter (Signed)
Yes, that's what she had said... however they may charge her again & send it to her if we send it. I mailed her an app for patient assistance with the [place]. Maybe we can hold off on sending it & just document it on file & I will send the RX to the company? Or did she say she was out?

## 2020-08-01 NOTE — Progress Notes (Signed)
Created in error

## 2020-08-02 NOTE — Telephone Encounter (Signed)
Was this resolved? Do I need to send a new RX?

## 2020-08-02 NOTE — Telephone Encounter (Signed)
I just called her... She has a bottle at home for now. Her mail order pharmacy sent her a 90 day supply but she was upset because of how much they charged her, so she sent 2 bottles back. She should be ok for a few weeks (if taking 2-3 times a week) while I work on getting her patient assistance on this medication.

## 2020-08-08 NOTE — Progress Notes (Signed)
Received notification from [employer]) regarding approval for [employer]. Patient assistance approved from 08/08/2020 to 06/01/2021.  Meds will ship to patients home. Patient can complete refills through order form attached to shipment or call.  Phone: [phone]

## 2020-08-23 ENCOUNTER — Encounter: Payer: Self-pay | Admitting: *Deleted

## 2020-08-23 NOTE — Progress Notes (Signed)

## 2020-08-23 NOTE — Progress Notes (Signed)
Things That May Be Affecting Your Health:  Alcohol  Hearing loss x Pain    Depression  Home Safety  Sexual Health  x Diabetes  Lack of physical activity  Stress   Difficulty with daily activities  Loneliness  Tiredness   Drug use  Medicines  Tobacco use   Falls  Motor Vehicle Safety  Weight  x Food choices  Oral Health  Other    YOUR PERSONALIZED HEALTH PLAN : 1. Schedule your next subsequent Medicare Wellness visit in one year 2. Attend all of your regular appointments to address your medical issues 3. Complete the preventative screenings and services   Annual Wellness Visit   Medicare Covered Preventative Screenings and [place] Men and Women Who How Often Need? Date of Last Service Action  Abdominal Aortic Aneurysm Adults with AAA risk factors Once      Alcohol Misuse and Counseling All Adults Screening once a year if no alcohol misuse. Counseling up to 4 face to face sessions.     Bone Density Measurement  Adults at risk for osteoporosis Once every 2 yrs      Lipid Panel Z13.6 All adults without CV disease Once every 5 yrs x      Colorectal Cancer   Stool sample or  Colonoscopy All adults 15 and older   Once every year  Every 10 years        Depression All Adults Once a year x Today   Diabetes Screening Blood glucose, post glucose load, or GTT Z13.1  All adults at risk  Pre-diabetics  Once per year  Twice per year      Diabetes  Self-Management Training All adults Diabetics 10 hrs first year; 2 hours subsequent years. Requires Copay x    Glaucoma  Diabetics  Family history of glaucoma  African Americans 6 yrs +  Hispanic Americans 24 yrs + Annually - requires coppay      Hepatitis C Z72.89 or F19.20  High Risk for HCV  Born between 1945 and 1965  Annually  Once      HIV Z11.4 All adults based on risk  Annually btw ages 46 & 65 regardless of risk  Annually > 65 yrs if at increased risk      Lung Cancer Screening  Asymptomatic adults aged 58-77 with 37 pack yr history and current smoker OR quit within the last 15 yrs Annually Must have counseling and shared decision making documentation before first screen      Medical Nutrition Therapy Adults with   Diabetes  Renal disease  Kidney transplant within past 3 yrs 3 hours first year; 2 hours subsequent years     Obesity and Counseling All adults Screening once a year Counseling if BMI 30 or higher x Today   Tobacco Use Counseling Adults who use tobacco  Up to 8 visits in one year     Vaccines Z23  Hepatitis B  Influenza   Pneumonia  Adults   Once  Once every flu season  Two different vaccines separated by one year x  Pneumonia vaccine Needs covid booster?  Next Annual Wellness Visit People with Medicare Every year  Today     [place] Women Who How Often Need  Date of Last Service Action  Mammogram  Z12.31 Women over 24 One baseline ages 14-39. Annually ager 40 yrs+      Pap tests All women Annually if high risk. Every 2 yrs for normal risk women  Screening for cervical cancer with   Pap (Z01.419 nl or Z01.411abnl) &  HPV Z11.51 Women aged 92 to 27 Once every 5 yrs     Screening pelvic and breast exams All women Annually if high risk. Every 2 yrs for normal risk women     Sexually Transmitted Diseases  Chlamydia  Gonorrhea  Syphilis All at risk adults Annually for non pregnant females at increased risk         [place] Men Who How Ofter Need  Date of Last Service Action  Prostate Cancer - DRE & PSA Men over 50 Annually.  DRE might require a copay.        Sexually Transmitted Diseases  Syphilis All at risk adults Annually for men at increased risk      Health Maintenance List Health Maintenance  Topic Date Due  . PNA vac Low Risk Adult (2 of 2 - PPSV23) 06/10/2018  . COVID-19 Vaccine (3 - Booster for Moderna series) 05/02/2020  . HEMOGLOBIN A1C  10/16/2020  . TETANUS/TDAP   02/19/2021  . OPHTHALMOLOGY EXAM  04/10/2021  . MAMMOGRAM  06/21/2021  . FOOT EXAM  07/19/2021  . LIPID PANEL  07/19/2021  . COLONOSCOPY (Pts 45-62yr Insurance coverage will need to be confirmed)  04/09/2025  . INFLUENZA VACCINE  Completed  . DEXA SCAN  Completed  . Hepatitis C Screening  Completed  . HPV VACCINES  Aged Out

## 2020-08-27 ENCOUNTER — Other Ambulatory Visit: Payer: Self-pay

## 2020-08-27 ENCOUNTER — Other Ambulatory Visit: Payer: Self-pay | Admitting: Nurse Practitioner

## 2020-08-27 DIAGNOSIS — Z1231 Encounter for screening mammogram for malignant neoplasm of breast: Secondary | ICD-10-CM

## 2020-09-03 ENCOUNTER — Telehealth: Payer: Self-pay

## 2020-09-03 NOTE — Telephone Encounter (Signed)
Requesting to speak with a nurse about meds and yeast infection. Please call pt back.

## 2020-09-03 NOTE — Telephone Encounter (Signed)
RTC to patient and she has 2 items to discuss:  #1 States her sugars are running in the 200;s, was 255 last night.  She is convinced this is because of her Crestor and is wanting to know if this med can cause a rise in sugars.  Will Butch Penny or Dr. Georgina Peer please call her back to discuss?  #2  She c/o a yeast infection (irritation and burning, white discharge)  States MD usually sends in diflucan 1-2 tabs.  Will send to red team to advise on if they would like to see pt or if they will send RX (wants this to go to mail off pharmacy - [facility]).  Thank you, SChaplin, RN,BSN

## 2020-09-03 NOTE — Telephone Encounter (Signed)
Would like to go back on Trulicity, got notice from [place] that it was approved, but that we never sent in a prescription. She has had the yeast infection for over a week, has not had one in "years" and higher blood sugars for the last week " for sure".She sates that everyday it is >200 and this unusual for her. She is unsure if it was the Crestor or her infection causing this. She asks that we send a prescription for 1 pill with a refill for her yeast infection [facility]. Please put a rush on it. I asked her to discuss this with Dr. Georgina Peer and one on the [facility] red team doctors. She asked for her Trulicity to be renewed, "I never had all these problems when I was on it"

## 2020-09-04 NOTE — Telephone Encounter (Signed)
Hello,   It looks like the patient is taking Jardiance as well, which is concerning if she is experiencing symptoms of a yeast infection. She will need a telehealth appointment to discuss this further.

## 2020-09-04 NOTE — Telephone Encounter (Signed)
I agree. I would recommend she stop her Jardiance until her blood sugars are better under control and then it can be resumed.

## 2020-09-04 NOTE — Telephone Encounter (Signed)
Resending to red team and Dr. Georgina Peer, please advise if pt needs to be seen for an appt?  Needs Telehealth appt?  If so, with MD or PharmD? Thanks! Sonya Lane

## 2020-09-07 ENCOUNTER — Other Ambulatory Visit: Payer: Self-pay

## 2020-09-07 ENCOUNTER — Ambulatory Visit (INDEPENDENT_AMBULATORY_CARE_PROVIDER_SITE_OTHER): Payer: Medicare Other | Admitting: Internal Medicine

## 2020-09-07 DIAGNOSIS — E1142 Type 2 diabetes mellitus with diabetic polyneuropathy: Secondary | ICD-10-CM

## 2020-09-07 DIAGNOSIS — B379 Candidiasis, unspecified: Secondary | ICD-10-CM

## 2020-09-07 DIAGNOSIS — B373 Candidiasis of vulva and vagina: Secondary | ICD-10-CM | POA: Diagnosis not present

## 2020-09-07 DIAGNOSIS — B3731 Acute candidiasis of vulva and vagina: Secondary | ICD-10-CM

## 2020-09-07 MED ORDER — FLUCONAZOLE 150 MG PO TABS: 150.0000 mg | ORAL_TABLET | Freq: Once | ORAL | 0 refills | Status: AC

## 2020-09-07 MED ORDER — GLIPIZIDE 5 MG PO TABS: 5.0000 mg | ORAL_TABLET | Freq: Every day | ORAL | 0 refills | Status: DC

## 2020-09-07 NOTE — Progress Notes (Signed)
   CC: vaginal yeast infection  This is a telephone encounter between Sonya Lane and Sonya Lane on 09/08/2020 for vaginal candidiasis. The visit was conducted with the patient located at home and Sonya Lane at [facility]. The patient's identity was confirmed using their DOB and current address. The patient has consented to being evaluated through a telephone encounter and understands the associated risks (an examination cannot be done and the patient may need to come in for an appointment) / benefits (allows the patient to remain at home, decreasing exposure to coronavirus). I personally spent 15 minutes on medical discussion.   HPI:  [name] is a 70 y.o. with PMH as below.   Please see A&P for assessment of the patient's acute and chronic medical conditions.   Past Medical History:  Diagnosis Date  . DIABETES MELLITUS, TYPE II 03/17/2006       . HYPERLIPIDEMIA 03/17/2006   Qualifier: Diagnosis of  By: Sharlet Salina MD, [place]    . HYPERTENSION 03/17/2006       . Morbid obesity ([place]) 01/25/2008   Qualifier: Diagnosis of  By: Phifer MD, Izora Gala     Review of Systems:  See assessment and plan    Assessment & Plan:   See Encounters Tab for problem based charting.  Patient discussed with Dr. Jimmye Norman

## 2020-09-08 ENCOUNTER — Encounter: Payer: Self-pay | Admitting: Internal Medicine

## 2020-09-08 NOTE — Assessment & Plan Note (Signed)
Patient recently seen by Dr. Wynetta Emery regarding her diabetes management. She was continued on metformin 1000 mg BID and Jardiance 10 mg qd. He makes a note that she was discontinued on trulicity. After speaking with the patient she would like to start this medication even though it is a once weekly injection. She states that she filled out paperwork for the [facility] program. I do not see this noted anywhere in her chart, so I informed her that I would follow up on this paperwork.   She states that she has been having CBG running in the 250's. Therefore, I will start her on glipizide 5 mg daily in the short term to get her blood sugar under better control while I try to get her access to truclicty. I counseled her to start taking her blood sugar once every morning fasting. She admits to understanding.  Plan: - Start glipizide while trying to get patient access to trulicity - Check CBG in the AM.

## 2020-09-08 NOTE — Assessment & Plan Note (Signed)
Patient presents for telehealth visit for vaginal yeast infection. She states that over the past 2 weeks she has experienced vaginal pruritis, white discharge and dysuria. She states that this feels like vaginal yeast infections that she has had in the past but has not had an infection in many years. She denies any recent unprotected sexual intercourse. Nor has she experienced any systemic signs of infection. She is taking Jardiance, which increases her risk of subsequent infections.   Plan:  - Diflucan 150 mg for one day.  - Gave return precautions regarding her symptoms

## 2020-09-11 ENCOUNTER — Telehealth: Payer: Self-pay

## 2020-09-11 NOTE — Telephone Encounter (Signed)
Received signed returned paperwork from patient for [place] patient assistance. Will move forward with getting this sent to [employer] once the following is clarified.  Patient is enrolled in patient assistance with [employer] for her jardiance, but explains that she isnt taking it anymore due to yeast infections. Pt also said she was told that once she starts taking the trulicity again (which I am currently working on re-enrollment), that she is supposed to stop the jardiance.   At one point I was also working on an Sport and exercise psychologist for her as well, but now says she doesn't take that anymore either.  Before I move forward with assistance on any of these medications, could someone clarify which ones she will actually need? And either I can call the patient back or someone reach out to her? I believe shes confused on what certain meds are for as well, being that she kept mentioned Crestor and glipizide while I was discussing other medications.

## 2020-09-11 NOTE — Addendum Note (Signed)
Addended by: Lawerance Cruel on: 09/11/2020 08:58 AM   Modules accepted: Orders

## 2020-09-12 NOTE — Progress Notes (Signed)
Internal Medicine Clinic Attending  Case discussed with Dr. Coe  At the time of the visit.  We reviewed the resident's history and exam and pertinent patient test results.  I agree with the assessment, diagnosis, and plan of care documented in the resident's note.  

## 2020-09-17 ENCOUNTER — Telehealth: Payer: Self-pay

## 2020-09-17 NOTE — Telephone Encounter (Signed)
Created in error

## 2020-09-17 NOTE — Telephone Encounter (Signed)
Spoke to patient again this am after receiving clarification from Dr. Coy Saunas.  Patient is taking metforming, jardiance, linzess and trulicity (currently taking glipizide until trulicity is approved through patient assistance). Also will discontinue jardiance once a1c goal is reached.   She is aware that I will call her back once I receive her signed [employer] app (trulicity) in the mail. I will also call her once I submit both apps (for trulicity and linzess).

## 2020-09-22 ENCOUNTER — Other Ambulatory Visit: Payer: Self-pay | Admitting: Internal Medicine

## 2020-09-22 DIAGNOSIS — E1142 Type 2 diabetes mellitus with diabetic polyneuropathy: Secondary | ICD-10-CM

## 2020-10-03 NOTE — Progress Notes (Signed)
Received notification from [place] regarding approval for [facility]. Patient assistance approved from 09/27/20 to 06/01/21.  Medication ships to patients home. I will request refills every 23 days.  Phone: [phone]

## 2020-10-04 NOTE — Progress Notes (Signed)
Submitted application for TRULICITY 4LM/7.6JH to [place] for patient assistance.   Phone: [phone]

## 2020-10-05 NOTE — Progress Notes (Signed)
Received notification from [place] regarding approval for TRULICITY 3VG/4.4YF. Patient assistance approved from 10/05/20 to 06/01/21.  Meds will ship to patients home.  Phone: [phone] Rx [facility]: [phone]

## 2020-10-09 ENCOUNTER — Other Ambulatory Visit: Payer: Self-pay

## 2020-10-09 MED ORDER — GLIPIZIDE 5 MG PO TABS: 5.0000 mg | ORAL_TABLET | Freq: Every day | ORAL | 0 refills | Status: DC

## 2020-10-09 NOTE — Telephone Encounter (Signed)
  glipiZIDE (GLUCOTROL) 5 MG tablet(Expired and yeast infection med @  [facility], [address], [address] Phone:  [phone]  Fax:  [phone]

## 2020-10-15 ENCOUNTER — Telehealth: Payer: Self-pay

## 2020-10-15 NOTE — Telephone Encounter (Signed)
Pls contact pt [phone]

## 2020-10-15 NOTE — Telephone Encounter (Signed)
Return pt's call - stated she has received her supply of Trulicity. Stated she stopped taking Jardiance on Friday and Glipizide. Stated jardiance gave her yeast infections. She started the Trulicity today. She wants to know if she suppose to continue Jardiance or what's the plan? Thanks

## 2020-10-16 NOTE — Telephone Encounter (Signed)
Called pt - no answer; left Dr Dorothyann Peng response on pt's self-identified vm "Yes, she is supposed to continue the [place]. However if she is having side effects to the Jardiance medication then she can hold it for now and make an appointment to discuss her diabetic regimen." And to call for any questions and to schedule an appt and to keep a record of her BS's.

## 2020-10-19 ENCOUNTER — Ambulatory Visit: Payer: Medicare Other

## 2020-10-23 ENCOUNTER — Ambulatory Visit
Admission: RE | Admit: 2020-10-23 | Discharge: 2020-10-23 | Disposition: A | Discharge status: Home / Self Care | Payer: Medicare Other | Source: Ambulatory Visit | Attending: Nurse Practitioner | Admitting: Nurse Practitioner

## 2020-10-23 ENCOUNTER — Other Ambulatory Visit: Payer: Self-pay

## 2020-10-23 DIAGNOSIS — Z1231 Encounter for screening mammogram for malignant neoplasm of breast: Secondary | ICD-10-CM

## 2020-10-27 ENCOUNTER — Encounter: Payer: Self-pay | Admitting: *Deleted

## 2020-10-31 ENCOUNTER — Encounter: Payer: Self-pay | Admitting: Internal Medicine

## 2020-10-31 ENCOUNTER — Ambulatory Visit (INDEPENDENT_AMBULATORY_CARE_PROVIDER_SITE_OTHER): Payer: Medicare Other | Admitting: Internal Medicine

## 2020-10-31 VITALS — BP 135/71 | HR 96 | Temp 98.6°F | Ht 65.0 in | Wt 261.2 lb

## 2020-10-31 DIAGNOSIS — E782 Mixed hyperlipidemia: Secondary | ICD-10-CM

## 2020-10-31 DIAGNOSIS — E1142 Type 2 diabetes mellitus with diabetic polyneuropathy: Secondary | ICD-10-CM

## 2020-10-31 DIAGNOSIS — B372 Candidiasis of skin and nail: Secondary | ICD-10-CM

## 2020-10-31 LAB — POCT GLYCOSYLATED HEMOGLOBIN (HGB A1C): Hemoglobin A1C: 7.8 % — AB (ref 4.0–5.6)

## 2020-10-31 LAB — GLUCOSE, CAPILLARY: Glucose-Capillary: 170 mg/dL — ABNORMAL HIGH (ref 70–99)

## 2020-10-31 MED ORDER — NYSTATIN 100000 UNIT/GM EX POWD: 1.0000 "application " | Freq: Three times a day (TID) | CUTANEOUS | 0 refills | Status: DC

## 2020-10-31 MED ORDER — ROSUVASTATIN CALCIUM 10 MG PO TABS: 5.0000 mg | ORAL_TABLET | Freq: Every day | ORAL | 3 refills | Status: DC

## 2020-10-31 NOTE — Patient Instructions (Addendum)
Ms. Rathe,   It was a pleasure meeting you today. Today we discussed the following:   1. Diabetes-I want you to continue taking Trulicity and metformin.  You can increase your nighttime gabapentin dose to 2 tablets (600 mg) if needed for your pain.  Plan to follow-up in 3 months for your diabetes.  2. Rash- I have sent in a prescription for nystatin powder for your fungal infection.  Make sure to stop using the cornstarch powder as well.  3. Increase your Crestor medication to 10 mg daily.  We will recheck your cholesterol level at your next visit.    Please call the internal medicine center clinic if you have any questions or concerns, we may be able to help and keep you from a long and expensive emergency room wait. Our clinic and after hours phone number is [phone], the best time to call is Monday through Friday 9 am to 4 pm but there is always someone available 24/7 if you have an emergency. If you need medication refills please notify your pharmacy one week in advance and they will send [place] a request.   Thank you for allowing [place] to be a part of your care!

## 2020-11-01 ENCOUNTER — Encounter: Payer: Self-pay | Admitting: Internal Medicine

## 2020-11-01 ENCOUNTER — Other Ambulatory Visit: Payer: Self-pay | Admitting: Internal Medicine

## 2020-11-01 DIAGNOSIS — B3731 Acute candidiasis of vulva and vagina: Secondary | ICD-10-CM

## 2020-11-01 DIAGNOSIS — B373 Candidiasis of vulva and vagina: Secondary | ICD-10-CM

## 2020-11-01 NOTE — Assessment & Plan Note (Signed)
Patient reports continued rash underneath her left breast.  States that this arose after she started taking Jardiance.  She was treated with 1 dose of flucanazole and states that it improved however then returned.  She has been using cornstarch daily; discussed that this is likely feeding yeast infection and to discontinue.  Assessment/plan: Candidal intertrigo of the left breast  - Nystatin powder - Recommended to keep the area dry -Stop the use of cornstarch

## 2020-11-01 NOTE — Assessment & Plan Note (Addendum)
Hemoglobin A1c 7.8.  Patient states that she stopped taking Jardiance and glipizide.  She started Trulicity 2 weeks ago, took her second dose this week.  She is tolerating this medication well.  She is still continuing metformin 1000 mg twice daily.  States that she did not like Jardiance as this caused her to have a yeast and fungal infection.  She is not sure if she was able to tolerate glipizide as she also stopped this when she stopped taking Jardiance a few weeks ago.  She is hesitant to starting more medications at this time.  She does report that her neuropathy is worse at nighttime predominantly in her feet.  Her Accu-Chek readings average at 159, highest 223 and lowest 115.  She states that her highest readings have been closer to the 300s.  Plan: -Continue Trulicity and metformin -Recheck hemoglobin A1c in 3 months -I would not recommend making any changes to her current regimen until you have seen the full effects of Trulicity -Can increase nighttime gabapentin dose to 600 mg as needed

## 2020-11-01 NOTE — Assessment & Plan Note (Signed)
LDL 160, will increase Crestor to 10 mg and recheck lipid profile at next visit. If tolerating well can up titrate as needed.   Plan: - Increase Crestor to 10 mg daily  - Recheck lipid panel in 3 months

## 2020-11-01 NOTE — Progress Notes (Signed)
   CC: DM, rash  HPI:  [name] is a 70 y.o. with a PMHx listed below presenting for evaluation of her diabetes mellitus and rash. For details of today's visit and the status of his chronic medical issues please refer to the assessment and plan.   Past Medical History:  Diagnosis Date  . DIABETES MELLITUS, TYPE II 03/17/2006       . HYPERLIPIDEMIA 03/17/2006   Qualifier: Diagnosis of  By: Sharlet Salina MD, [place]    . HYPERTENSION 03/17/2006       . Morbid obesity ([place]) 01/25/2008   Qualifier: Diagnosis of  By: Phifer MD, Izora Gala     Review of Systems:   Review of Systems  Constitutional: Negative for chills, fever and malaise/fatigue.  Respiratory: Negative for cough and shortness of breath.   Gastrointestinal: Negative for abdominal pain, nausea and vomiting.  Genitourinary: Negative for dysuria, frequency and urgency.  Neurological: Negative for dizziness, weakness and headaches.  All other systems reviewed and are negative.    Physical Exam:  Vitals:   10/31/20 1001  BP: 135/71  Pulse: 96  Temp: 98.6 F (37 C)  TempSrc: Oral  SpO2: 98%  Weight: 261 lb 3.2 oz (118.5 kg)  Height: 5' 5"  (1.651 m)   Physical Exam Vitals reviewed.  Constitutional:      General: She is not in acute distress.    Appearance: Normal appearance. She is not ill-appearing.  HENT:     Head: Normocephalic and atraumatic.  Cardiovascular:     Rate and Rhythm: Normal rate and regular rhythm.     Pulses: Normal pulses.     Heart sounds: Normal heart sounds. No murmur heard. No friction rub. No gallop.   Pulmonary:     Effort: Pulmonary effort is normal. No respiratory distress.     Breath sounds: Normal breath sounds. No wheezing or rales.  Abdominal:     General: Abdomen is flat. Bowel sounds are normal. There is no distension.     Palpations: Abdomen is soft.     Tenderness: There is no abdominal tenderness. There is no guarding.  Musculoskeletal:        General: No swelling or  tenderness.     Right lower leg: No edema.     Left lower leg: No edema.  Skin:    General: Skin is warm and dry.  Neurological:     Mental Status: She is alert and oriented to person, place, and time.  Psychiatric:        Mood and Affect: Mood normal.        Behavior: Behavior normal.        Thought Content: Thought content normal.        Judgment: Judgment normal.     Assessment & Plan:   See Encounters Tab for problem based charting.  Patient discussed with Dr. Jimmye Norman

## 2020-11-07 NOTE — Progress Notes (Signed)
Internal Medicine Clinic Attending ° °Case discussed with Dr. Rehman  At the time of the visit.  We reviewed the resident’s history and exam and pertinent patient test results.  I agree with the assessment, diagnosis, and plan of care documented in the resident’s note.  ° °

## 2020-11-22 ENCOUNTER — Other Ambulatory Visit: Payer: Self-pay | Admitting: Internal Medicine

## 2020-11-22 DIAGNOSIS — E1142 Type 2 diabetes mellitus with diabetic polyneuropathy: Secondary | ICD-10-CM

## 2020-12-04 ENCOUNTER — Encounter: Payer: Self-pay | Admitting: *Deleted

## 2020-12-10 ENCOUNTER — Other Ambulatory Visit: Payer: Self-pay | Admitting: Internal Medicine

## 2020-12-24 ENCOUNTER — Telehealth: Payer: Self-pay | Admitting: *Deleted

## 2020-12-24 NOTE — Telephone Encounter (Signed)
Patient stated drugstore called and said a new prescription was called in for her.  She is not aware of such medication. Please call.

## 2020-12-24 NOTE — Telephone Encounter (Signed)
I left a message for patient to ask [facility] about other glucometers covered by her insurance and named some options that may have larger strips to better meet her needs.

## 2020-12-24 NOTE — Telephone Encounter (Signed)
RTC to patient, she was informed that [facility] received a refill request from her mail off pharmacy, [facility], for 1 year supply of Lancets and that MD sent this RX to [facility] on 11/27/20.  Pt states she did not request any lancets, that she already has lancets.  Pt informed she will need to call her insurance company and ask them why they requested 1 year refill.   Patient states she is having trouble with her DM kit, it is small and she wants to know if she can get a different kit.  Will forward to Butch Penny and ask her to call patient about different kits that might be available. Thank you, SChaplin, RN,BSN

## 2021-01-17 ENCOUNTER — Other Ambulatory Visit: Payer: Self-pay | Admitting: Internal Medicine

## 2021-01-17 DIAGNOSIS — E782 Mixed hyperlipidemia: Secondary | ICD-10-CM

## 2021-01-17 MED ORDER — ROSUVASTATIN CALCIUM 10 MG PO TABS: 10.0000 mg | ORAL_TABLET | Freq: Every day | ORAL | 3 refills | Status: DC

## 2021-01-17 NOTE — Telephone Encounter (Signed)
Patient notified that new Rx for 10 mg daily sent to [facility]. She is very [employer] attorney.

## 2021-01-17 NOTE — Telephone Encounter (Signed)
Per OV note on 6/1, patient is to take rosuvastatin 10 mg daily. Rx sent for half tab (5 mg total). Will request PCP send new Rx.

## 2021-01-17 NOTE — Telephone Encounter (Signed)
Refill Request- Pt requesting a call back about what dose she is to take.  Patient has spoken to her pharmacy and they have asked for her to give the PCP a call to confirm which dosage she is to have  rosuvastatin (CRESTOR) 10 MG tablet   [facility] - [place], [place] (Ph: [phone])

## 2021-04-18 DIAGNOSIS — E113313 Type 2 diabetes mellitus with moderate nonproliferative diabetic retinopathy with macular edema, bilateral: Secondary | ICD-10-CM | POA: Diagnosis not present

## 2021-04-18 DIAGNOSIS — H25813 Combined forms of age-related cataract, bilateral: Secondary | ICD-10-CM | POA: Diagnosis not present

## 2021-04-18 LAB — HM DIABETES EYE EXAM

## 2021-04-23 ENCOUNTER — Encounter: Payer: Self-pay | Admitting: Internal Medicine

## 2021-04-23 ENCOUNTER — Other Ambulatory Visit: Payer: Self-pay

## 2021-04-23 ENCOUNTER — Ambulatory Visit (INDEPENDENT_AMBULATORY_CARE_PROVIDER_SITE_OTHER): Payer: Medicare Other | Admitting: Internal Medicine

## 2021-04-23 VITALS — BP 124/58 | HR 80 | Temp 98.0°F | Resp 18 | Ht 65.0 in | Wt 258.5 lb

## 2021-04-23 DIAGNOSIS — Z0000 Encounter for general adult medical examination without abnormal findings: Secondary | ICD-10-CM

## 2021-04-23 DIAGNOSIS — K582 Mixed irritable bowel syndrome: Secondary | ICD-10-CM | POA: Diagnosis not present

## 2021-04-23 DIAGNOSIS — E1142 Type 2 diabetes mellitus with diabetic polyneuropathy: Secondary | ICD-10-CM | POA: Diagnosis not present

## 2021-04-23 DIAGNOSIS — E782 Mixed hyperlipidemia: Secondary | ICD-10-CM | POA: Diagnosis not present

## 2021-04-23 DIAGNOSIS — L74519 Primary focal hyperhidrosis, unspecified: Secondary | ICD-10-CM | POA: Insufficient documentation

## 2021-04-23 DIAGNOSIS — Z23 Encounter for immunization: Secondary | ICD-10-CM

## 2021-04-23 HISTORY — DX: Primary focal hyperhidrosis, unspecified: L74.519

## 2021-04-23 LAB — POCT GLYCOSYLATED HEMOGLOBIN (HGB A1C): Hemoglobin A1C: 7.1 % — AB (ref 4.0–5.6)

## 2021-04-23 LAB — GLUCOSE, CAPILLARY: Glucose-Capillary: 119 mg/dL — ABNORMAL HIGH (ref 70–99)

## 2021-04-23 MED ORDER — LINACLOTIDE 290 MCG PO CAPS: 290.0000 ug | ORAL_CAPSULE | Freq: Every day | ORAL | 3 refills | Status: DC

## 2021-04-23 NOTE — Assessment & Plan Note (Signed)
Patient received the flu vaccination today.  Discussed that she is also due for the pneumonia, COVID, Shingrix and Tdap vaccinations.  Recommended that she get these at an outside pharmacy.

## 2021-04-23 NOTE — Assessment & Plan Note (Signed)
Patient is tolerating Crestor 10 mg daily.  Denies any associated myalgias.  Plan to recheck lipid panel today.

## 2021-04-23 NOTE — Progress Notes (Signed)
   CC: type II DM, hyperlipidemia  HPI:  [name] is a 70 y.o. with a past medical history of hypertension, hyperlipidemia, obesity and type 2 diabetes presenting for follow-up of her diabetes and hyperlipidemia. For details of today's visit and the status of his chronic medical issues please refer to the assessment and plan.   Past Medical History:  Diagnosis Date   Chest pain 02/16/2019   DIABETES MELLITUS, TYPE II 03/17/2006        HYPERLIPIDEMIA 03/17/2006   Qualifier: Diagnosis of  By: Sharlet Salina MD, [place]     HYPERTENSION 03/17/2006        Morbid obesity ([place]) 01/25/2008   Qualifier: Diagnosis of  By: Phifer MD, Izora Gala     Statin intolerance 12/11/2015   Review of Systems:   Review of Systems  Constitutional:  Positive for diaphoresis. Negative for chills, fever, malaise/fatigue and weight loss.  Respiratory:  Negative for cough and shortness of breath.   Cardiovascular:  Negative for chest pain, palpitations and leg swelling.  Gastrointestinal:  Negative for abdominal pain, constipation, diarrhea, nausea and vomiting.  Musculoskeletal:  Positive for joint pain.  Skin:  Negative for rash.  Neurological:  Negative for weakness and headaches.    Physical Exam:  Vitals:   04/23/21 1509  BP: (!) 124/58  Pulse: 80  Resp: 18  Temp: 98 F (36.7 C)  SpO2: 98%  Weight: 258 lb 8 oz (117.3 kg)  Height: 5' 5"  (1.651 m)    Physical Exam General: alert, appears stated age, in no acute distress HEENT: Normocephalic, atraumatic, EOM intact, conjunctiva normal CV: Regular rate and rhythm, no murmurs rubs or gallops Pulm: Clear to auscultation bilaterally, normal work of breathing Abdomen: Soft, nondistended, bowel sounds present, no tenderness to palpation MSK: No lower extremity edema Skin: Warm and dry Neuro: Alert and oriented x3   Assessment & Plan:   See Encounters Tab for problem based charting.  Patient discussed with Dr. Heber Fairdale

## 2021-04-23 NOTE — Assessment & Plan Note (Signed)
Patient reports excessive daytime and nighttime sweating since June.  States initially she attributed this to the heat however this has persisted and progressed.  States that she is having excessive sweating on her forehead.  States when she wakes up at nighttime to use the bathroom she notices that her forehead is sweating.  Denies sweating anywhere else.  Denies any weight loss, weight gain, hair loss, nausea, vomiting, palpitations, shortness of breath, diarrhea or headaches.  No history of thyroid disease.  Assessment/plan: Unclear etiology at this point, will check a TSH and CBC today.  Of note, calcium channel blockers can cause a focal hyperhidrosis however patient has been on this medication for a long time and thus unlikely contributing.  -Follow-up TSH and CBC

## 2021-04-23 NOTE — Patient Instructions (Signed)
Sonya Lane,  It was a pleasure seeing you today. Today we discussed the following:   Type 2 diabetes-your A1c is 7.1 today which is improved since her last visit.  Continuing taking her medications as prescribed and keep up the great work.  Hyperlipidemia-I am rechecking your cholesterol levels today I will call you with the results of this.  In the meantime continue taking Crestor 10 mg daily  3.  Excessive sweating-I am checking some blood work today, I will call you with the results of this.      Please call the internal medicine center clinic if you have any questions or concerns, we may be able to help and keep you from a long and expensive emergency room wait. Our clinic and after hours phone number is [phone], the best time to call is Monday through Friday 9 am to 4 pm but there is always someone available 24/7 if you have an emergency. If you need medication refills please notify your pharmacy one week in advance and they will send [place] a request.   Thank you for allowing [place] to be a part of your care!

## 2021-04-23 NOTE — Assessment & Plan Note (Addendum)
Hemoglobin A1c 7.1, improved from 7.8.  She is currently taking Trulicity and metformin 1000 mg twice daily.  Stated she is taking 3 mg weekly.  She is tolerating these medications well.  Her Accu-Chek showed an average blood glucose reading of 145, high of 187 low of 128.  States that she saw ophthalmology recently, request records.  -Can discontinue checking her blood sugars -Recheck hemoglobin A1c in 3 months -Continue Trulicity 3 mg weekly and metformin 1000 mg twice daily

## 2021-04-23 NOTE — Assessment & Plan Note (Signed)
Patient reports that she is taking Linzess 290 mcg capsules daily.

## 2021-04-24 LAB — CBC WITH DIFFERENTIAL/PLATELET
Basophils Absolute: 0 10*3/uL (ref 0.0–0.2)
Basos: 0 %
EOS (ABSOLUTE): 0.3 10*3/uL (ref 0.0–0.4)
Eos: 3 %
Hematocrit: 41.9 % (ref 34.0–46.6)
Hemoglobin: 13.8 g/dL (ref 11.1–15.9)
Immature Grans (Abs): 0 10*3/uL (ref 0.0–0.1)
Immature Granulocytes: 0 %
Lymphocytes Absolute: 2.7 10*3/uL (ref 0.7–3.1)
Lymphs: 32 %
MCH: 27.5 pg (ref 26.6–33.0)
MCHC: 32.9 g/dL (ref 31.5–35.7)
MCV: 84 fL (ref 79–97)
Monocytes Absolute: 0.6 10*3/uL (ref 0.1–0.9)
Monocytes: 7 %
Neutrophils Absolute: 4.9 10*3/uL (ref 1.4–7.0)
Neutrophils: 58 %
Platelets: 286 10*3/uL (ref 150–450)
RBC: 5.01 x10E6/uL (ref 3.77–5.28)
RDW: 14.5 % (ref 11.7–15.4)
WBC: 8.5 10*3/uL (ref 3.4–10.8)

## 2021-04-24 LAB — LIPID PANEL
Chol/HDL Ratio: 2.8 ratio (ref 0.0–4.4)
Cholesterol, Total: 143 mg/dL (ref 100–199)
HDL: 51 mg/dL (ref >39)
LDL Chol Calc (NIH): 72 mg/dL (ref 0–99)
Triglycerides: 111 mg/dL (ref 0–149)
VLDL Cholesterol Cal: 20 mg/dL (ref 5–40)

## 2021-04-24 LAB — TSH: TSH: 1.12 u[IU]/mL (ref 0.450–4.500)

## 2021-04-26 NOTE — Progress Notes (Signed)
Internal Medicine Clinic Attending ° °Case discussed with Dr. Rehman  At the time of the visit.  We reviewed the resident’s history and exam and pertinent patient test results.  I agree with the assessment, diagnosis, and plan of care documented in the resident’s note.  ° °

## 2021-05-21 ENCOUNTER — Encounter: Payer: Self-pay | Admitting: Dietician

## 2021-05-23 ENCOUNTER — Other Ambulatory Visit: Payer: Self-pay | Admitting: Student

## 2021-05-23 DIAGNOSIS — I10 Essential (primary) hypertension: Secondary | ICD-10-CM

## 2021-06-07 ENCOUNTER — Other Ambulatory Visit: Payer: Self-pay | Admitting: Student

## 2021-06-07 DIAGNOSIS — E1142 Type 2 diabetes mellitus with diabetic polyneuropathy: Secondary | ICD-10-CM

## 2021-06-07 DIAGNOSIS — G59 Mononeuropathy in diseases classified elsewhere: Secondary | ICD-10-CM

## 2021-06-22 ENCOUNTER — Other Ambulatory Visit: Payer: Self-pay | Admitting: Student

## 2021-06-22 DIAGNOSIS — E1142 Type 2 diabetes mellitus with diabetic polyneuropathy: Secondary | ICD-10-CM

## 2021-07-17 ENCOUNTER — Other Ambulatory Visit: Payer: Self-pay | Admitting: Dietician

## 2021-07-17 DIAGNOSIS — E1142 Type 2 diabetes mellitus with diabetic polyneuropathy: Secondary | ICD-10-CM

## 2021-07-17 NOTE — Telephone Encounter (Signed)
Patient request a prescription for a blood glucose meter sent to [facility]

## 2021-07-21 MED ORDER — ACCU-CHEK GUIDE W/DEVICE KIT
PACK | 1 refills | Status: DC
Start: 2021-07-21 — End: 2022-07-03

## 2021-07-23 NOTE — Telephone Encounter (Signed)
Patient informed. 

## 2021-07-23 NOTE — Progress Notes (Signed)
Received notification from [place] regarding approval for TRULICITY 4ER/1.5QM. Patient assistance approved from 07/18/21 to 06/01/22.  MEDICATION SHIPMENTS ARE DELAYED BUT WILL SHIP TO PT AS SOON AS POSSIBLE  Phone: [phone]

## 2021-07-25 ENCOUNTER — Ambulatory Visit (INDEPENDENT_AMBULATORY_CARE_PROVIDER_SITE_OTHER): Payer: Medicare Other | Admitting: Internal Medicine

## 2021-07-25 ENCOUNTER — Encounter: Payer: Self-pay | Admitting: Internal Medicine

## 2021-07-25 VITALS — BP 124/70 | HR 82 | Temp 98.3°F | Ht 65.0 in | Wt 260.9 lb

## 2021-07-25 DIAGNOSIS — I10 Essential (primary) hypertension: Secondary | ICD-10-CM | POA: Diagnosis not present

## 2021-07-25 DIAGNOSIS — E1142 Type 2 diabetes mellitus with diabetic polyneuropathy: Secondary | ICD-10-CM

## 2021-07-25 DIAGNOSIS — Z0000 Encounter for general adult medical examination without abnormal findings: Secondary | ICD-10-CM

## 2021-07-25 DIAGNOSIS — K582 Mixed irritable bowel syndrome: Secondary | ICD-10-CM

## 2021-07-25 DIAGNOSIS — Z23 Encounter for immunization: Secondary | ICD-10-CM

## 2021-07-25 DIAGNOSIS — E782 Mixed hyperlipidemia: Secondary | ICD-10-CM | POA: Diagnosis not present

## 2021-07-25 DIAGNOSIS — L659 Nonscarring hair loss, unspecified: Secondary | ICD-10-CM | POA: Insufficient documentation

## 2021-07-25 LAB — POCT GLYCOSYLATED HEMOGLOBIN (HGB A1C): Hemoglobin A1C: 7.8 % — AB (ref 4.0–5.6)

## 2021-07-25 LAB — GLUCOSE, CAPILLARY: Glucose-Capillary: 244 mg/dL — ABNORMAL HIGH (ref 70–99)

## 2021-07-25 MED ORDER — AMLODIPINE BESYLATE 10 MG PO TABS: 10.0000 mg | ORAL_TABLET | Freq: Every evening | ORAL | 3 refills | Status: DC

## 2021-07-25 MED ORDER — CARVEDILOL 25 MG PO TABS: 25.0000 mg | ORAL_TABLET | Freq: Two times a day (BID) | ORAL | 3 refills | Status: DC

## 2021-07-25 MED ORDER — LINACLOTIDE 290 MCG PO CAPS: 290.0000 ug | ORAL_CAPSULE | Freq: Every day | ORAL | 3 refills | Status: DC

## 2021-07-25 MED ORDER — ACCU-CHEK FASTCLIX LANCETS MISC: 3 refills | Status: DC

## 2021-07-25 MED ORDER — LISINOPRIL-HYDROCHLOROTHIAZIDE 20-25 MG PO TABS: 1.0000 | ORAL_TABLET | Freq: Every day | ORAL | 3 refills | Status: DC

## 2021-07-25 MED ORDER — ACCU-CHEK GUIDE VI STRP: ORAL_STRIP | 3 refills | Status: DC

## 2021-07-25 MED ORDER — METFORMIN HCL 1000 MG PO TABS: 1000.0000 mg | ORAL_TABLET | Freq: Two times a day (BID) | ORAL | 3 refills | Status: DC

## 2021-07-25 MED ORDER — TRULICITY 4.5 MG/0.5ML ~~LOC~~ SOAJ: 4.5000 mg | SUBCUTANEOUS | 2 refills | Status: AC

## 2021-07-25 NOTE — Assessment & Plan Note (Signed)
Patient notes that since she has started crestor, she has had thinning of her hair and hair loss. She is very bothered by this and has stopped taking crestor. Discussed that we could start a different statin medication (she has hyperlipidemia) to see if this would improve her symptoms, but the patient declined. Also discussed a referral to dermatology, but she has declined this, as well, but will consider it in the future.

## 2021-07-25 NOTE — Assessment & Plan Note (Signed)
BP Readings from Last 3 Encounters:  07/25/21 124/70  04/23/21 (!) 124/58  10/31/20 135/71   BP well controlled on current regimen including amlodipine 10 mg daily, lisinopril-hctz 20-25 mg daily, and coreg 25 mg bid. Patient denies any headache, dizziness, LOC, chest pain, palpitations, or any other symptoms at this time. Will continue current regimen.

## 2021-07-25 NOTE — Assessment & Plan Note (Signed)
Patient is adamant that her crestor has been causing her hair loss and she has since discontinued this medicine. She states that she does not have high cholesterol and that her level was normal last time. Discussed that this was most likely due to her being on the crestor, however, she still wishes to stop it. Also discussed switching crestor to a different statin (simvastatin, pravastatin), but she still declined.

## 2021-07-25 NOTE — Assessment & Plan Note (Signed)
Patient has been noted to have mild hypercalcemia on her last few BMPs (last Ca level 10.6). Patient takes a one-a-day vitamin, and suspect that hypercalcemia could be secondary to this. Discussed this with the patient and recommended she stop this vitamin, or take a vitamin supplement that does not have calcium in it.  Recheck BMP at next visit.

## 2021-07-25 NOTE — Assessment & Plan Note (Signed)
A1c slightly increased from 7.1 at the last visit to 7.8 today. She has been taking metformin 1 g bid and Trulicity 3 mg/weekly, although she has been out of the Trulicity for about 2 weeks. She states that she does not check her sugar level at home ever, as her meter broke.   Plan: - Repeat A1c in 3 months - Increase Trulicity to 4.5 mg weekly - Continue metformin 1000 mg bid

## 2021-07-25 NOTE — Assessment & Plan Note (Signed)
Td booster given today  Patient declined getting the Shingrix vaccine at her local pharmacy.

## 2021-07-25 NOTE — Patient Instructions (Signed)
Thank you, Ms.[name] for allowing [place] to provide your care today. Today we discussed:  High blood pressure: keep taking amlodipine, coreg, and lisinopril-hydrochlorothiazide  Diabetes: Your a1c was a little higher today. Keep taking metformin twice a day and start the 4.5 mg dose of trulicity (still once a week). We will recheck your a1c in 3 months  Cholesterol: I would recommend you keep taking your statin, however, if you believe this is causing your hair loss we can see how you do off of it. I would recommend switching your statin, if you are agreeable  Tetanus booster given today  I have ordered the following labs for you:   Lab Orders         Microalbumin / Creatinine Urine Ratio         Glucose, capillary         POC Hbg A1C      Tests ordered today:  none  Referrals ordered today:   Referral Orders  No referral(s) requested today     I have ordered the following medication/changed the following medications:   Stop the following medications: Medications Discontinued During This Encounter  Medication Reason   Dulaglutide (TRULICITY) 3 TD/1.7OH SOPN    amLODipine (NORVASC) 10 MG tablet Reorder   lisinopril-hydrochlorothiazide (ZESTORETIC) 20-25 MG tablet Reorder   ACCU-CHEK GUIDE test strip Reorder   Accu-Chek FastClix Lancets MISC Reorder   linaclotide (LINZESS) 290 MCG CAPS capsule Reorder   carvedilol (COREG) 25 MG tablet Reorder   metFORMIN (GLUCOPHAGE) 1000 MG tablet Reorder     Start the following medications: Meds ordered this encounter  Medications   Accu-Chek FastClix Lancets MISC    Sig: CHECK BLOOD SUGAR ONCE  DAILY    Dispense:  102 each    Refill:  3    Requesting 1 year supply   glucose blood (ACCU-CHEK GUIDE) test strip    Sig: Use to check blood sugar once daily    Dispense:  100 strip    Refill:  3    Requesting 1 year supply   metFORMIN (GLUCOPHAGE) 1000 MG tablet    Sig: Take 1 tablet (1,000 mg total) by mouth 2 (two) times  daily with a meal.    Dispense:  180 tablet    Refill:  3    Requesting 1 year supply   amLODipine (NORVASC) 10 MG tablet    Sig: Take 1 tablet (10 mg total) by mouth at bedtime.    Dispense:  90 tablet    Refill:  3    Requesting 1 year supply   carvedilol (COREG) 25 MG tablet    Sig: Take 1 tablet (25 mg total) by mouth 2 (two) times daily.    Dispense:  180 tablet    Refill:  3    Requesting 1 year supply   lisinopril-hydrochlorothiazide (ZESTORETIC) 20-25 MG tablet    Sig: Take 1 tablet by mouth daily.    Dispense:  90 tablet    Refill:  3    Requesting 1 year supply   linaclotide (LINZESS) 290 MCG CAPS capsule    Sig: Take 1 capsule (290 mcg total) by mouth daily before breakfast.    Dispense:  90 capsule    Refill:  3   Dulaglutide (TRULICITY) 4.5 YW/7.3XT SOPN    Sig: Inject 4.5 mg as directed once a week.    Dispense:  3 mL    Refill:  2     Follow up: 3 months  Remember: To call if you need any of your medications refilled  Should you have any questions or concerns please call the internal medicine clinic at [phone].     Buddy Duty, D.O. [place]

## 2021-07-25 NOTE — Progress Notes (Signed)
° °  CC: routine visit  HPI:  Sonya Lane is a 71 y.o. female with hypertension, T2DM, NAFLD, and HLD who presents to the [facility] for a routine visit. Please see problem-based list for further details, assessments, and plans.   Past Medical History:  Diagnosis Date   Chest pain 02/16/2019   DIABETES MELLITUS, TYPE II 03/17/2006        HYPERLIPIDEMIA 03/17/2006   Qualifier: Diagnosis of  By: Sharlet Salina MD, [place]     HYPERTENSION 03/17/2006        Morbid obesity ([place]) 01/25/2008   Qualifier: Diagnosis of  By: Phifer MD, Izora Gala     Statin intolerance 12/11/2015   Review of Systems:  Review of Systems  Constitutional:  Negative for chills and fever.  HENT:  Positive for congestion. Negative for sinus pain and sore throat.   Respiratory:  Negative for cough and sputum production.   Cardiovascular:  Negative for chest pain and palpitations.  Gastrointestinal:  Positive for constipation. Negative for diarrhea, nausea and vomiting.  Genitourinary: Negative.   Musculoskeletal: Negative.   Neurological:  Negative for dizziness and headaches.    Physical Exam:  Vitals:   07/25/21 1024  BP: (!) 175/110  Pulse: 82  Temp: 98.3 F (36.8 C)  TempSrc: Oral  SpO2: 98%  Weight: 260 lb 14.4 oz (118.3 kg)  Height: 5' 5"  (1.651 m)   General: Pleasant, elderly female. No acute distress. CV: RRR. No murmurs No LE edema Pulmonary: Lungs CTAB. Normal effort.  Abdominal: Soft, nontender, nondistended. Normal bowel sounds. Extremities: Palpable radial and DP pulses.  Skin: Warm and dry.  Neuro: A&Ox3. No focal deficit. Psych: Normal mood and affect   Assessment & Plan:   See Encounters Tab for problem based charting.  Patient discussed with Dr. Daryll Drown

## 2021-07-26 LAB — MICROALBUMIN / CREATININE URINE RATIO
Creatinine, Urine: 177.9 mg/dL
Microalb/Creat Ratio: 129 mg/g creat — ABNORMAL HIGH (ref 0–29)
Microalbumin, Urine: 229.1 ug/mL

## 2021-07-29 ENCOUNTER — Telehealth: Payer: Self-pay | Admitting: Internal Medicine

## 2021-07-29 NOTE — Progress Notes (Signed)
Internal Medicine Clinic Attending  Case discussed with Dr. Raymondo Band  at the time of the visit.  We reviewed the residents history and exam and pertinent patient test results.  I agree with the assessment, diagnosis, and plan of care documented in the residents note.

## 2021-07-29 NOTE — Telephone Encounter (Signed)
Left message with pt informing her that if her mail order medication has not been opened it can be returned to her mail order pharmacy ([facility])- she will need to call them. Also informed that if she has not rec'd her medication from [facility] yet they are having shipment delays due to backorders. But she is enrolled until the end of year.  Pt is enrolled with [employer] and rec's her medications from their pharmacy ([facility]) via mail order. I will add this pharmacy to her list as well so future refills of trulicity can be sent to them.

## 2021-07-29 NOTE — Telephone Encounter (Signed)
Pt returned phone call. Pt had already opened & used trulicity thinking it was from [facility], so it cannot be returned. Pt understands. She will still keep an eye out for her [facility] shipment. She says she told provider to not send trulicity and linzess since those were coming from pt assistance companies and were too expense on her insurance.  Linzess is currently pending renewal but will be updated in chart when response is rec'd.

## 2021-07-29 NOTE — Telephone Encounter (Signed)
Please call the patient back.  Patient states the following medication was delivered from the wrong pharmacy who is charging her and she can not afford the medication.  Pt states the pharmacist normally helps her fill the paperwork out to get it.    Trulicity Dulaglutide (TRULICITY) 4.5 DS/8.9TV SOPN Inject 4.5 mg as directed once a week., Starting Thu 07/25/2021, Until Wed 10/23/2021, Normal  Dispense: 3 mL  Refills: 2 ordered  Pharmacy: [facility] ([place], [place] (Ph: [phone])

## 2021-09-05 NOTE — Progress Notes (Signed)
Received notification from [place] regarding approval for [facility]. Patient assistance approved from 08/02/21 to 06/01/22. ? ?MEDICATION SHIPS TO PT'S HOME. NO AUTO REFILL. PT MAY CALL TO REQUEST REFILL ? ?Phone: [phone] ? ?

## 2021-09-11 ENCOUNTER — Telehealth: Payer: Self-pay | Admitting: *Deleted

## 2021-09-11 NOTE — Telephone Encounter (Signed)
Patient called in asking [place] to contact [facility] and cancel Rxs for Linzess and Trulicity. States she receives these meds through Patient Assistance from the manufacturers. [facility] mistakenly sent her Trulicity and she cannot pay for it. Call placed to [place] at [facility] who has d/c the Rx for Trulicity. ? ?Linzess Rx is expired. There is no active Rx for this on file with [facility].  ?

## 2021-09-17 DIAGNOSIS — Z86010 Personal history of colonic polyps: Secondary | ICD-10-CM | POA: Diagnosis not present

## 2021-09-17 DIAGNOSIS — K5904 Chronic idiopathic constipation: Secondary | ICD-10-CM | POA: Diagnosis not present

## 2021-09-17 DIAGNOSIS — K760 Fatty (change of) liver, not elsewhere classified: Secondary | ICD-10-CM | POA: Diagnosis not present

## 2021-11-13 ENCOUNTER — Other Ambulatory Visit: Payer: Self-pay | Admitting: Internal Medicine

## 2021-11-13 DIAGNOSIS — Z1231 Encounter for screening mammogram for malignant neoplasm of breast: Secondary | ICD-10-CM

## 2021-11-18 ENCOUNTER — Other Ambulatory Visit: Payer: Self-pay | Admitting: Internal Medicine

## 2021-11-18 DIAGNOSIS — E782 Mixed hyperlipidemia: Secondary | ICD-10-CM

## 2021-11-28 ENCOUNTER — Ambulatory Visit
Admission: RE | Admit: 2021-11-28 | Discharge: 2021-11-28 | Disposition: A | Discharge status: Home / Self Care | Payer: Medicare Other | Source: Ambulatory Visit | Attending: Internal Medicine | Admitting: Internal Medicine

## 2021-11-28 ENCOUNTER — Ambulatory Visit: Payer: Medicare Other

## 2021-11-28 DIAGNOSIS — Z1231 Encounter for screening mammogram for malignant neoplasm of breast: Secondary | ICD-10-CM

## 2021-12-02 ENCOUNTER — Ambulatory Visit (INDEPENDENT_AMBULATORY_CARE_PROVIDER_SITE_OTHER): Payer: Medicare Other | Admitting: Student

## 2021-12-02 VITALS — BP 126/44 | HR 79 | Temp 98.0°F | Ht 65.0 in | Wt 261.2 lb

## 2021-12-02 DIAGNOSIS — Z7984 Long term (current) use of oral hypoglycemic drugs: Secondary | ICD-10-CM | POA: Diagnosis not present

## 2021-12-02 DIAGNOSIS — B372 Candidiasis of skin and nail: Secondary | ICD-10-CM | POA: Diagnosis not present

## 2021-12-02 DIAGNOSIS — E1142 Type 2 diabetes mellitus with diabetic polyneuropathy: Secondary | ICD-10-CM | POA: Diagnosis not present

## 2021-12-02 DIAGNOSIS — I10 Essential (primary) hypertension: Secondary | ICD-10-CM

## 2021-12-02 DIAGNOSIS — E782 Mixed hyperlipidemia: Secondary | ICD-10-CM | POA: Diagnosis not present

## 2021-12-02 LAB — POCT GLYCOSYLATED HEMOGLOBIN (HGB A1C): Hemoglobin A1C: 7.2 % — AB (ref 4.0–5.6)

## 2021-12-02 LAB — GLUCOSE, CAPILLARY: Glucose-Capillary: 199 mg/dL — ABNORMAL HIGH (ref 70–99)

## 2021-12-02 MED ORDER — NYSTATIN 100000 UNIT/GM EX CREA
1.0000 | TOPICAL_CREAM | Freq: Two times a day (BID) | CUTANEOUS | 0 refills | Status: DC
Start: 2021-12-02 — End: 2022-07-03

## 2021-12-02 MED ORDER — TRULICITY 4.5 MG/0.5ML ~~LOC~~ SOAJ: 4.5000 mg | SUBCUTANEOUS | Status: DC

## 2021-12-02 NOTE — Progress Notes (Signed)
CC: follow up diabetes, hypertension  HPI:  Sonya Lane is a 71 y.o. female living with a history stated below and presents today for diabetes and hypertension. Please see problem based assessment and plan for additional details.  Past Medical History:  Diagnosis Date   Chest pain 02/16/2019   DIABETES MELLITUS, TYPE II 03/17/2006        HYPERLIPIDEMIA 03/17/2006   Qualifier: Diagnosis of  By: Sharlet Salina MD, [place]     HYPERTENSION 03/17/2006        Morbid obesity ([place]) 01/25/2008   Qualifier: Diagnosis of  By: Phifer MD, Izora Gala     Statin intolerance 12/11/2015    Current Outpatient Medications on File Prior to Visit  Medication Sig Dispense Refill   Accu-Chek FastClix Lancets MISC CHECK BLOOD SUGAR ONCE  DAILY 102 each 3   amLODipine (NORVASC) 10 MG tablet Take 1 tablet (10 mg total) by mouth at bedtime. 90 tablet 3   aspirin 81 MG tablet Take 81 mg by mouth daily.     Blood Glucose Monitoring Suppl (ACCU-CHEK GUIDE) w/Device KIT Check blood sugar 1 time daily or up to 7 times a week 1 kit 1   carvedilol (COREG) 25 MG tablet Take 1 tablet (25 mg total) by mouth 2 (two) times daily. 180 tablet 3   gabapentin (NEURONTIN) 300 MG capsule TAKE 1 CAPSULE BY MOUTH 3  TIMES DAILY 270 capsule 3   glucose blood (ACCU-CHEK GUIDE) test strip Use to check blood sugar once daily 100 strip 3   linaclotide (LINZESS) 290 MCG CAPS capsule Take 1 capsule (290 mcg total) by mouth daily before breakfast. 90 capsule 3   lisinopril-hydrochlorothiazide (ZESTORETIC) 20-25 MG tablet Take 1 tablet by mouth daily. 90 tablet 3   metFORMIN (GLUCOPHAGE) 1000 MG tablet Take 1 tablet (1,000 mg total) by mouth 2 (two) times daily with a meal. 180 tablet 3   Multiple Vitamins-Minerals (ONE-A-DAY 50 PLUS PO) Take 1 tablet by mouth in the morning.     No current facility-administered medications on file prior to visit.    Family History  Problem Relation Age of Onset   Cancer Mother 24       Ovarian CA    Diabetes Mother    Cancer Father        Lung   Diabetes Sister    Arthritis Sister    Varicose Veins Sister    Diabetes Sister    Diabetes Brother    Diabetes Brother     Social History   Socioeconomic History   Marital status: Widowed    Spouse name: Not on file   Number of children: Not on file   Years of education: 13   Highest education level: Not on file  Occupational History   Occupation: Retired    Comment: Marine scientist Aide   Occupation: Disabled  Tobacco Use   Smoking status: Never   Smokeless tobacco: Never  Vaping Use   Vaping Use: Never used  Substance and Sexual Activity   Alcohol use: No   Drug use: No   Sexual activity: Not Currently  Other Topics Concern   Not on file  Social History Narrative   Current Social History 03/04/2019        Patient lives alone in a second floor apartment. There are 18 steps with handrails up to the entrance the patient uses.       Patient's method of transportation is personal car.      The highest level of  education was some college.      The patient currently disabled.      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  Vitals:   12/02/21 1023 12/02/21 1144  BP: (!) 142/66 (!) 126/44  Pulse: 84 79  Temp: 98 F (36.7  C)   TempSrc: Oral   SpO2: 100%   Weight: 261 lb 3.2 oz (118.5 kg)   Height: _0  (1.651 m)     Physical Exam: Constitutional: well-appearing, no acute distress HENT: normocephalic atraumatic Eyes: conjunctiva non-erythematous Neck: supple Cardiovascular: regular rate and rhythm, no m/r/g Pulmonary/Chest: normal work of breathing on room air, lungs clear to auscultation bilaterally MSK: normal bulk and tone Neurological: alert & oriented x 3 Skin: warm and dry Psych: normal mood  Assessment & Plan:   Essential hypertension Assessment: Current regimen of amlodipine 10 mg daily, carvedilol 25 mg BID, and lisinopril-HCTZ 20-25 mg daily. Initial BP elevated, on repeat 126/44.   Plan: -continue management as per above  Type 2 diabetes mellitus with diabetic polyneuropathy ([place]) Assessment: A1c from 7.8 to 7.2%. Congratulated her on this. Current regimen of trulicity 4.5 mg/weekly and metformin 1000 mg BID. She receives trulicity through medication assistance in our clinic. She is tolerating both medications well without complications. Discussed continuing healthy diet and regular exercise.   Plan: -Repeat a1c in 3 months -continue 5.9FM/BWGYKZ trulicity and metformin 1000 mg bid  Hyperlipidemia Assessment: Hx of hyperlipidemia and in the past was recommended statin therapy with elevated ASCVD risk score and DM. Per chart review appears patient has intermittently taken statin's in the past with intolerance to pravastatin 2/2 myalgias. Recently she took her self off of Crestor for suspicion it led to her alopecia. On today's visit she mentioned not feelings ell when taking Crestor with her other medications. We discussed other high intensity statin therapy with lipitor. We discussed side effects and cost, this medication is on the [place] outpatient pharmacy 5$ list. Patient would like to think about it. Discussed benefits of starting statin therapy.   Plan: -consider starting  lipitor at follow up visit   Candidal intertrigo Assessment: Continues to endorse bilateral rash under breasts. In the past diagnosed with candidal intertrigo and written for nystatin powder. Of note, at that time she was also using corn starch which she was instructed to discontinue as it was probably worsening the infection.   She has been using over the counter creams that have not been helping. She was previously prescribed nystatin powder, however, patient did not believe was worth the cost for small bottle. On exam, did not appreciate macerated areas, but did  appear irritated.   Discussed methods to decrease moisture such as putting cotton sock or t shirt under breasts on hot days. Will also send in for nystatin cream. If no improvement continue PO antifungal.   Plan: -nystatin cream ordered -methods to decrease moisture given   Patient discussed with Dr. Newell Coral, D.O. [facility], PGY-2 Pager: [phone], Phone: [phone] Date 12/02/2021 Time 8:06 PM

## 2021-12-02 NOTE — Patient Instructions (Addendum)
Thank you, Ms.[name] for allowing [place] to provide your care today. Today we discussed .  Diabetes Mellitus Please continue to take your medications. You are doing great! Keep up the diet and exercise.   High blood pressure Continue to take your home medications.   Rash under Breast Use the nystatin cream. Use cotton under the breast and bra.   High Cholesterol.    Consider this medication lipitor.  I have ordered the following labs for you:   Lab Orders         Glucose, capillary         POC Hbg A1C       Referrals ordered today:   Referral Orders  No referral(s) requested today     I have ordered the following medication/changed the following medications:   Stop the following medications: Medications Discontinued During This Encounter  Medication Reason   nystatin (NYSTATIN) powder    rosuvastatin (CRESTOR) 10 MG tablet      Start the following medications: Meds ordered this encounter  Medications   nystatin cream (MYCOSTATIN)    Sig: Apply 1 Application topically 2 (two) times daily.    Dispense:  30 g    Refill:  0     Follow up: 3 months    Should you have any questions or concerns please call the internal medicine clinic at [phone].    Sanjuana Letters, D.O. [place]

## 2021-12-02 NOTE — Assessment & Plan Note (Addendum)
Assessment: Continues to endorse bilateral rash under breasts. In the past diagnosed with candidal intertrigo and written for nystatin powder. Of note, at that time she was also using corn starch which she was instructed to discontinue as it was probably worsening the infection.   She has been using over the counter creams that have not been helping. She was previously prescribed nystatin powder, however, patient did not believe was worth the cost for small bottle. On exam, did not appreciate macerated areas, but did appear irritated.   Discussed methods to decrease moisture such as putting cotton sock or t shirt under breasts on hot days. Will also send in for nystatin cream. If no improvement continue PO antifungal.   Plan: -nystatin cream ordered -methods to decrease moisture given

## 2021-12-02 NOTE — Assessment & Plan Note (Addendum)
Assessment: A1c from 7.8 to 7.2%. Congratulated her on this. Current regimen of trulicity 4.5 mg/weekly and metformin 1000 mg BID. She receives trulicity through medication assistance in our clinic. She is tolerating both medications well without complications. Discussed continuing healthy diet and regular exercise.   Plan: -Repeat a1c in 3 months -continue 4.5OP/FYTWKM trulicity and metformin 1000 mg bid

## 2021-12-02 NOTE — Assessment & Plan Note (Addendum)
Assessment: Current regimen of amlodipine 10 mg daily, carvedilol 25 mg BID, and lisinopril-HCTZ 20-25 mg daily. Initial BP elevated, on repeat 126/44.   Plan: -continue management as per above

## 2021-12-02 NOTE — Assessment & Plan Note (Signed)
Assessment: Hx of hyperlipidemia and in the past was recommended statin therapy with elevated ASCVD risk score and DM. Per chart review appears patient has intermittently taken statin's in the past with intolerance to pravastatin 2/2 myalgias. Recently she took her self off of Crestor for suspicion it led to her alopecia. On today's visit she mentioned not feelings ell when taking Crestor with her other medications. We discussed other high intensity statin therapy with lipitor. We discussed side effects and cost, this medication is on the [facility] outpatient pharmacy 5$ list. Patient would like to think about it. Discussed benefits of starting statin therapy.   Plan: -consider starting lipitor at follow up visit

## 2021-12-10 NOTE — Progress Notes (Signed)
Internal Medicine Clinic Attending  Case discussed with Dr. Katsadouros  At the time of the visit.  We reviewed the resident's history and exam and pertinent patient test results.  I agree with the assessment, diagnosis, and plan of care documented in the resident's note.  

## 2021-12-26 ENCOUNTER — Other Ambulatory Visit: Payer: Self-pay

## 2021-12-26 DIAGNOSIS — K582 Mixed irritable bowel syndrome: Secondary | ICD-10-CM

## 2021-12-26 MED ORDER — LINACLOTIDE 290 MCG PO CAPS: 290.0000 ug | ORAL_CAPSULE | Freq: Every day | ORAL | 3 refills | Status: DC

## 2021-12-26 NOTE — Telephone Encounter (Signed)
linaclotide Rolan Lipa)   [facility]

## 2021-12-27 MED ORDER — LINACLOTIDE 290 MCG PO CAPS: 290.0000 ug | ORAL_CAPSULE | Freq: Every day | ORAL | 3 refills | Status: DC

## 2021-12-27 NOTE — Addendum Note (Signed)
Addended by: Velora Heckler on: 12/27/2021 11:52 AM   Modules accepted: Orders

## 2022-03-03 ENCOUNTER — Other Ambulatory Visit: Payer: Self-pay

## 2022-03-03 DIAGNOSIS — G59 Mononeuropathy in diseases classified elsewhere: Secondary | ICD-10-CM

## 2022-03-03 DIAGNOSIS — E1142 Type 2 diabetes mellitus with diabetic polyneuropathy: Secondary | ICD-10-CM

## 2022-03-06 MED ORDER — GABAPENTIN 300 MG PO CAPS: 300.0000 mg | ORAL_CAPSULE | Freq: Three times a day (TID) | ORAL | 3 refills | Status: DC

## 2022-03-20 ENCOUNTER — Ambulatory Visit (INDEPENDENT_AMBULATORY_CARE_PROVIDER_SITE_OTHER): Payer: Medicare Other | Admitting: Student

## 2022-03-20 VITALS — BP 142/66 | HR 76 | Temp 97.7°F | Ht 65.0 in | Wt 264.0 lb

## 2022-03-20 DIAGNOSIS — Z7984 Long term (current) use of oral hypoglycemic drugs: Secondary | ICD-10-CM | POA: Diagnosis not present

## 2022-03-20 DIAGNOSIS — R5381 Other malaise: Secondary | ICD-10-CM | POA: Diagnosis not present

## 2022-03-20 DIAGNOSIS — I10 Essential (primary) hypertension: Secondary | ICD-10-CM | POA: Diagnosis not present

## 2022-03-20 DIAGNOSIS — E782 Mixed hyperlipidemia: Secondary | ICD-10-CM

## 2022-03-20 DIAGNOSIS — E1142 Type 2 diabetes mellitus with diabetic polyneuropathy: Secondary | ICD-10-CM

## 2022-03-20 DIAGNOSIS — G59 Mononeuropathy in diseases classified elsewhere: Secondary | ICD-10-CM

## 2022-03-20 LAB — POCT GLYCOSYLATED HEMOGLOBIN (HGB A1C): Hemoglobin A1C: 7.1 % — AB (ref 4.0–5.6)

## 2022-03-20 LAB — GLUCOSE, CAPILLARY: Glucose-Capillary: 127 mg/dL — ABNORMAL HIGH (ref 70–99)

## 2022-03-20 MED ORDER — METFORMIN HCL 1000 MG PO TABS: 1000.0000 mg | ORAL_TABLET | Freq: Two times a day (BID) | ORAL | 3 refills | Status: DC

## 2022-03-20 MED ORDER — ATORVASTATIN CALCIUM 40 MG PO TABS: 40.0000 mg | ORAL_TABLET | Freq: Every day | ORAL | 3 refills | Status: DC

## 2022-03-20 MED ORDER — CARVEDILOL 25 MG PO TABS: 25.0000 mg | ORAL_TABLET | Freq: Two times a day (BID) | ORAL | 3 refills | Status: DC

## 2022-03-20 MED ORDER — LISINOPRIL-HYDROCHLOROTHIAZIDE 20-25 MG PO TABS: 1.0000 | ORAL_TABLET | Freq: Every day | ORAL | 3 refills | Status: DC

## 2022-03-20 MED ORDER — GABAPENTIN 300 MG PO CAPS: 300.0000 mg | ORAL_CAPSULE | Freq: Three times a day (TID) | ORAL | 3 refills | Status: DC

## 2022-03-20 MED ORDER — AMLODIPINE BESYLATE 10 MG PO TABS: 10.0000 mg | ORAL_TABLET | Freq: Every evening | ORAL | 3 refills | Status: DC

## 2022-03-20 NOTE — Progress Notes (Signed)
   CC: Follow-up  HPI:  [name] is a 71 y.o. female with history as below who presents for follow-up visit.  Please see encounters tab for problem-based charting.  Past Medical History:  Diagnosis Date   Chest pain 02/16/2019   DIABETES MELLITUS, TYPE II 03/17/2006        HYPERLIPIDEMIA 03/17/2006   Qualifier: Diagnosis of  By: Sonya Salina MD, [place]     HYPERTENSION 03/17/2006        Morbid obesity ([place]) 01/25/2008   Qualifier: Diagnosis of  By: Phifer MD, Sonya Lane     Statin intolerance 12/11/2015   Review of Systems:   A comprehensive review of systems was negative.   Physical Exam:  Vitals:   03/20/22 1315 03/20/22 1413  BP: (!) 145/56 (!) 142/66  Pulse: 80 76  Temp: 97.7 F (36.5 C)   TempSrc: Oral   SpO2: 98%   Weight: 264 lb (119.7 kg)   Height: 5' 5"  (1.651 m)    Constitutional: Obese female sitting in a chair. In no acute distress. HENT: Normocephalic, atraumatic,  Eyes: Sclera non-icteric, PERRL, EOM intact Cardio:Regular rate and rhythm. No murmurs, rubs, or gallops. 2+ bilateral radial pulses. Pulm:Clear to auscultation bilaterally. Normal work of breathing on room air. Abdomen: Soft, non-tender, non-distended, positive bowel sounds. PJS:RPRXYVOP for extremity edema. Skin:Warm and dry. Neuro:Alert and oriented x3. No focal deficit noted. Psych:Pleasant mood and affect.   Assessment & Plan:   See Encounters Tab for problem based charting.  Patient seen with Dr. Evette Doffing

## 2022-03-20 NOTE — Assessment & Plan Note (Signed)
A1c today is stable at 7.1.  Glucose is well controlled patient advised that she does not need to keep checking her blood sugar at home. - Continue Trulicity 4.5 mg weekly and metformin 1000 mg twice daily

## 2022-03-20 NOTE — Assessment & Plan Note (Signed)
Patient notes slowly worsening deconditioning and has trouble sometimes walking through stores.  She states that again has been helping usually but she has had to sometimes use a [place] which her son had.  She states that she needs a [place] with wheels and a seat.  I believe that the patient's mobility limitation significantly impairs her ability to participate in 1 or more of her mobility related activities of daily living in the home and outside the home.  She has demonstrated that she can safely use a Fleck.  I believe that the addition of [place] will allow her to complete her activities that she is currently limited from.

## 2022-03-20 NOTE — Assessment & Plan Note (Signed)
Patient again counseled on ASCVD risk score of 22.1% and this time agrees to start statin. - Start atorvastatin 40 mg daily

## 2022-03-20 NOTE — Patient Instructions (Signed)
  Thank you, Ms.[name], for allowing [place] to provide your care today. Today we discussed . . .  > Diabetes       -Your diabetes is under good control and A1c is stable at 7.1.  Please continue take your Trulicity and metformin as you have been.  You may also stop checking your blood sugar at home and we will check your A1c every 3 months here. > High cholesterol       -Please take Lipitor 40 mg daily to help reduce your risk of heart attack and stroke.  If you have any issues please call our office. > Hypertension       -Please continue to take your blood pressure medications as prescribed and we have sent in refills today.   I have ordered the following labs for you:  Lab Orders         Glucose, capillary         POC Hbg A1C       Tests ordered today:  None   Referrals ordered today:   Referral Orders  No referral(s) requested today      I have ordered the following medication/changed the following medications:   Stop the following medications: Medications Discontinued During This Encounter  Medication Reason   metFORMIN (GLUCOPHAGE) 1000 MG tablet Reorder   amLODipine (NORVASC) 10 MG tablet Reorder   carvedilol (COREG) 25 MG tablet Reorder   gabapentin (NEURONTIN) 300 MG capsule Reorder   aspirin 81 MG tablet      Start the following medications: Meds ordered this encounter  Medications   atorvastatin (LIPITOR) 40 MG tablet    Sig: Take 1 tablet (40 mg total) by mouth daily.    Dispense:  30 tablet    Refill:  3   amLODipine (NORVASC) 10 MG tablet    Sig: Take 1 tablet (10 mg total) by mouth at bedtime.    Dispense:  90 tablet    Refill:  3    Requesting 1 year supply   carvedilol (COREG) 25 MG tablet    Sig: Take 1 tablet (25 mg total) by mouth 2 (two) times daily.    Dispense:  180 tablet    Refill:  3    Requesting 1 year supply   gabapentin (NEURONTIN) 300 MG capsule    Sig: Take 1 capsule (300 mg total) by mouth 3 (three) times daily.     Dispense:  270 capsule    Refill:  3    Requesting 1 year supply   metFORMIN (GLUCOPHAGE) 1000 MG tablet    Sig: Take 1 tablet (1,000 mg total) by mouth 2 (two) times daily with a meal.    Dispense:  180 tablet    Refill:  3    Requesting 1 year supply   lisinopril-hydrochlorothiazide (ZESTORETIC) 20-25 MG tablet    Sig: Take 1 tablet by mouth daily.    Dispense:  90 tablet    Refill:  3      Follow up: 3 months    Remember:     Should you have any questions or concerns please call the internal medicine clinic at [phone].     Johny Blamer, [facility]  ]

## 2022-03-20 NOTE — Assessment & Plan Note (Signed)
BP elevated today but has been well controlled in the past.  Patient attributes this to a long walk to the clinic.  Recheck also high blood but we will hold off on medication changes at this time. - Continue amlodipine 10 mg daily, carvedilol 25 mg twice daily, lisinopril/hydrochlorothiazide 20/25 milligrams daily

## 2022-03-20 NOTE — Progress Notes (Signed)
Results of hemoglobin A1c and Glucose were discussed with the patient during her visit.

## 2022-03-21 NOTE — Progress Notes (Signed)
Internal Medicine Clinic Attending  I saw and evaluated the patient.  I personally confirmed the key portions of the history and exam documented by Dr. Nikki Dom and I reviewed pertinent patient test results.  The assessment, diagnosis, and plan were formulated together and I agree with the documentation in the resident's note.

## 2022-04-14 ENCOUNTER — Telehealth: Payer: Self-pay

## 2022-04-14 ENCOUNTER — Other Ambulatory Visit (HOSPITAL_COMMUNITY): Payer: Self-pay

## 2022-04-14 NOTE — Telephone Encounter (Signed)
Pt states she has ran out of the following medication The patient also states that she needs help getting the [employer] Application completed as well.  linaclotide Rolan Lipa) 290 MCG CAPS capsule   [facility] [place], [place] (Ph: [phone])

## 2022-04-14 NOTE — Telephone Encounter (Signed)
#  90 with 3 refills sent to [facility], an [place] on 12/27/21. Rosendo Gros, can you please assist patient with this? Thank you!

## 2022-04-14 NOTE — Telephone Encounter (Signed)
Disregard previous message, realized you were speaking on previous refills sent!!  Spoke with pt, she's going to attempt to call [employer] again to get her refills. She says the number wasn't working the last few times she tried to [employer]. But it worked when I called.  Pt will call me back directly if she has any issues. I will also mail a re-enrollment application ro her home.

## 2022-04-22 NOTE — Telephone Encounter (Signed)
MAILED [employer] RE-ENROLLMENT APPLICATION TO PT'S HOME FOR [place].

## 2022-04-23 DIAGNOSIS — K648 Other hemorrhoids: Secondary | ICD-10-CM | POA: Diagnosis not present

## 2022-04-23 DIAGNOSIS — Z09 Encounter for follow-up examination after completed treatment for conditions other than malignant neoplasm: Secondary | ICD-10-CM | POA: Diagnosis not present

## 2022-04-23 DIAGNOSIS — D124 Benign neoplasm of descending colon: Secondary | ICD-10-CM | POA: Diagnosis not present

## 2022-04-23 DIAGNOSIS — Z8719 Personal history of other diseases of the digestive system: Secondary | ICD-10-CM | POA: Diagnosis not present

## 2022-04-23 DIAGNOSIS — K5730 Diverticulosis of large intestine without perforation or abscess without bleeding: Secondary | ICD-10-CM | POA: Diagnosis not present

## 2022-04-23 DIAGNOSIS — Z86010 Personal history of colonic polyps: Secondary | ICD-10-CM | POA: Diagnosis not present

## 2022-04-23 DIAGNOSIS — D175 Benign lipomatous neoplasm of intra-abdominal organs: Secondary | ICD-10-CM | POA: Diagnosis not present

## 2022-04-23 DIAGNOSIS — K644 Residual hemorrhoidal skin tags: Secondary | ICD-10-CM | POA: Diagnosis not present

## 2022-06-05 DIAGNOSIS — H25813 Combined forms of age-related cataract, bilateral: Secondary | ICD-10-CM | POA: Diagnosis not present

## 2022-06-05 DIAGNOSIS — E113313 Type 2 diabetes mellitus with moderate nonproliferative diabetic retinopathy with macular edema, bilateral: Secondary | ICD-10-CM | POA: Diagnosis not present

## 2022-06-05 DIAGNOSIS — H16223 Keratoconjunctivitis sicca, not specified as Sjogren's, bilateral: Secondary | ICD-10-CM | POA: Diagnosis not present

## 2022-06-10 ENCOUNTER — Other Ambulatory Visit: Payer: Self-pay | Admitting: Student

## 2022-06-10 DIAGNOSIS — E1142 Type 2 diabetes mellitus with diabetic polyneuropathy: Secondary | ICD-10-CM

## 2022-06-10 DIAGNOSIS — E782 Mixed hyperlipidemia: Secondary | ICD-10-CM

## 2022-06-10 NOTE — Telephone Encounter (Signed)
Next appt scheduled 06/13/22 with PCP.

## 2022-06-13 ENCOUNTER — Ambulatory Visit (INDEPENDENT_AMBULATORY_CARE_PROVIDER_SITE_OTHER): Payer: Medicare Other

## 2022-06-13 VITALS — BP 142/73 | HR 88 | Temp 98.3°F | Wt 263.6 lb

## 2022-06-13 DIAGNOSIS — Z7984 Long term (current) use of oral hypoglycemic drugs: Secondary | ICD-10-CM

## 2022-06-13 DIAGNOSIS — E1142 Type 2 diabetes mellitus with diabetic polyneuropathy: Secondary | ICD-10-CM | POA: Diagnosis not present

## 2022-06-13 DIAGNOSIS — Z23 Encounter for immunization: Secondary | ICD-10-CM | POA: Diagnosis not present

## 2022-06-13 DIAGNOSIS — Z0000 Encounter for general adult medical examination without abnormal findings: Secondary | ICD-10-CM

## 2022-06-13 DIAGNOSIS — Z794 Long term (current) use of insulin: Secondary | ICD-10-CM | POA: Diagnosis not present

## 2022-06-13 DIAGNOSIS — I10 Essential (primary) hypertension: Secondary | ICD-10-CM

## 2022-06-13 DIAGNOSIS — E782 Mixed hyperlipidemia: Secondary | ICD-10-CM | POA: Diagnosis not present

## 2022-06-13 LAB — POCT GLYCOSYLATED HEMOGLOBIN (HGB A1C): Hemoglobin A1C: 7.6 % — AB (ref 4.0–5.6)

## 2022-06-13 LAB — GLUCOSE, CAPILLARY: Glucose-Capillary: 177 mg/dL — ABNORMAL HIGH (ref 70–99)

## 2022-06-13 MED ORDER — METFORMIN HCL ER 500 MG PO TB24: 1000.0000 mg | ORAL_TABLET | Freq: Two times a day (BID) | ORAL | 2 refills | Status: DC

## 2022-06-13 NOTE — Patient Instructions (Addendum)
Sonya Lane, it was a pleasure seeing you today!  Today we discussed: Diabetes- Continue taking your Trulicity. Take metformin XR 1000 mg (2 tablets) with breakfast and again with dinner. Return in 3 months for A1c check. High blood pressure- Continue amlodipine 10 mg daily, carvedilol 25 mg twice daily, lisinopril/hydrochlorothiazide 20/25 milligrams once daily. We can revisit referral for sleep study and increasing medications at next visit.  I have ordered the following labs today:  Lab Orders         Glucose, capillary         Aldosterone/Renin         Microalbumin / Creatinine Urine Ratio         BMP8+Anion Gap         Lipid Profile         POC Hbg A1C      Tests ordered today:  none  Referrals ordered today:   Referral Orders  No referral(s) requested today     I have ordered the following medication/changed the following medications:   Stop the following medications: Medications Discontinued During This Encounter  Medication Reason   metFORMIN (GLUCOPHAGE) 1000 MG tablet Change in therapy     Start the following medications: Meds ordered this encounter  Medications   metFORMIN (GLUCOPHAGE-XR) 500 MG 24 hr tablet    Sig: Take 2 tablets (1,000 mg total) by mouth 2 (two) times daily with a meal.    Dispense:  120 tablet    Refill:  2     Follow-up: 3 months   Please make sure to arrive 15 minutes prior to your next appointment. If you arrive late, you may be asked to reschedule.   We look forward to seeing you next time. Please call our clinic at [phone] if you have any questions or concerns. The best time to call is Monday-Friday from 9am-4pm, but there is someone available 24/7. If after hours or the weekend, call the main hospital number and ask for the Internal Medicine Resident On-Call. If you need medication refills, please notify your pharmacy one week in advance and they will send [place] a request.  Thank you for letting us take part in your  care. Wishing you the best!  Thank you, Linward Natal, MD

## 2022-06-13 NOTE — Assessment & Plan Note (Addendum)
BP Readings from Last 3 Encounters:  06/13/22 (!) 142/73  03/20/22 (!) 142/66  12/02/21 (!) 126/44  Patient presents with history of essential hypertension.  She continues to be hypertensive today, blood pressure 142/73.  She is currently on amlodipine 10 mg daily, carvedilol 25 mg twice daily, lisinopril / hydrochlorothiazide 20-25 milligrams once daily. Denies chest pain, vision changes. Chronic intermittent tension headaches but not changes since last OV. Discussed likely benefit from increasing lisinopril but patient wishes not to do this. She is willing to revisit at next appointment however. We also discussed that she likely has sleep apnea (STOP BANG 6) and that this could be contributing to her resistant hypertension. She declines referral for sleep study and states she does not feel like this is a problem for her. She is however willing to discuss this at future OV as well. -Continue current regimen -BMP, aldo/renin

## 2022-06-13 NOTE — Assessment & Plan Note (Addendum)
Patient reports "mid racing" at night which she attributes to her Lipitor. Discussed that statins shouldn't interfere with sleep or cause cognitive side effect. Her ASCVD 10 yr risk is quite elevated and benefits greatly from statin therapy. Discussed this with her and she is amenable to continuing Lipitor 40 mg but will take in the morning instead of at night. -continue Lipitor 40 mg daily. Could consider switching to Crestor at next OV if still complaining of side effects. -repeat lipid panel

## 2022-06-13 NOTE — Progress Notes (Signed)
CC: DMII f/u  HPI:  Sonya Lane is a 72 y.o. with past medical history as below who presents for diabetes follow-up.  See detailed assessment and plan for HPI.  Past Medical History:  Diagnosis Date   Chest pain 02/16/2019   DIABETES MELLITUS, TYPE II 03/17/2006        HYPERLIPIDEMIA 03/17/2006   Qualifier: Diagnosis of  By: Sharlet Salina MD, [place]     HYPERTENSION 03/17/2006        Morbid obesity ([place]) 01/25/2008   Qualifier: Diagnosis of  By: Phifer MD, Izora Gala     Statin intolerance 12/11/2015   Review of Systems: See detailed assessment and plan for pertinent ROS.  Physical Exam:  Vitals:   06/13/22 1024  BP: (!) 142/73  Pulse: 88  Temp: 98.3 F (36.8 C)  TempSrc: Oral  SpO2: 97%  Weight: 263 lb 9.6 oz (119.6 kg)   Physical Exam Constitutional:      General: She is not in acute distress. HENT:     Head: Normocephalic and atraumatic.  Eyes:     Extraocular Movements: Extraocular movements intact.  Cardiovascular:     Rate and Rhythm: Normal rate and regular rhythm.     Pulses: Normal pulses.     Heart sounds: No murmur heard. Pulmonary:     Effort: Pulmonary effort is normal.     Breath sounds: No wheezing or rales.  Musculoskeletal:     Cervical back: Neck supple.     Right lower leg: No edema.     Left lower leg: No edema.  Skin:    General: Skin is warm and dry.  Neurological:     Mental Status: She is alert and oriented to person, place, and time.  Psychiatric:        Mood and Affect: Mood normal.        Behavior: Behavior normal.      Assessment & Plan:   See Encounters Tab for problem based charting.  Essential hypertension BP Readings from Last 3 Encounters:  06/13/22 (!) 142/73  03/20/22 (!) 142/66  12/02/21 (!) 126/44  Patient presents with history of essential hypertension.  She continues to be hypertensive today, blood pressure 142/73.  She is currently on amlodipine 10 mg daily, carvedilol 25 mg twice daily, lisinopril /  hydrochlorothiazide 20-25 milligrams once daily. Denies chest pain, vision changes. Chronic intermittent tension headaches but not changes since last OV. Discussed likely benefit from increasing lisinopril but patient wishes not to do this. She is willing to revisit at next appointment however. We also discussed that she likely has sleep apnea (STOP BANG 6) and that this could be contributing to her resistant hypertension. She declines referral for sleep study and states she does not feel like this is a problem for her. She is however willing to discuss this at future OV as well. -Continue current regimen -BMP, aldo/renin   Type 2 diabetes mellitus with diabetic polyneuropathy (HCC) A1c today is 7.6, up from 7.1 at last OV.  Patient no longer checks her sugars at home.  Denies polyuria, polydipsia. She feels her blood sugar probably suffered from poor diet over thanksgiving and christmas. Endorses GI upset with metformin despite having been on stable dose for years. Will switch to XR formulation and see if that helps her symptoms. -Continue Trulicity 4.5 mg weekly -switch metformin to XR 500 mg two tablets BID with meals -Follow-up in 3 months for A1c check  Hyperlipidemia Patient reports "mid racing" at night which she  attributes to her Lipitor. Discussed that statins shouldn't interfere with sleep or cause cognitive side effect. Her ASCVD 10 yr risk is quite elevated and benefits greatly from statin therapy. Discussed this with her and she is amenable to continuing Lipitor 40 mg but will take in the morning instead of at night. -continue Lipitor 40 mg daily. Could consider switching to Crestor at next OV if still complaining of side effects. -repeat lipid panel  Healthcare maintenance Pneumococcal vaccine today. Flu vaccine today.  Patient had colonoscopy recently with recommended repeat every 3 years.   Patient discussed with Dr. Evette Doffing

## 2022-06-13 NOTE — Assessment & Plan Note (Addendum)
A1c today is 7.6, up from 7.1 at last OV.  Patient no longer checks her sugars at home.  Denies polyuria, polydipsia. She feels her blood sugar probably suffered from poor diet over thanksgiving and christmas. Endorses GI upset with metformin despite having been on stable dose for years. Will switch to XR formulation and see if that helps her symptoms. -Continue Trulicity 4.5 mg weekly -switch metformin to XR 500 mg two tablets BID with meals -Follow-up in 3 months for A1c check

## 2022-06-13 NOTE — Assessment & Plan Note (Signed)
Pneumococcal vaccine today. Flu vaccine today.  Patient had colonoscopy recently with recommended repeat every 3 years.

## 2022-06-14 LAB — MICROALBUMIN / CREATININE URINE RATIO
Creatinine, Urine: 112.5 mg/dL
Microalb/Creat Ratio: 308 mg/g creat — ABNORMAL HIGH (ref 0–29)
Microalbumin, Urine: 346.1 ug/mL

## 2022-06-18 MED ORDER — LISINOPRIL 20 MG PO TABS: 20.0000 mg | ORAL_TABLET | Freq: Every day | ORAL | 11 refills | Status: DC

## 2022-06-18 NOTE — Addendum Note (Signed)
Addended by: Linward Natal on: 06/18/2022 02:36 PM   Modules accepted: Orders

## 2022-06-18 NOTE — Progress Notes (Signed)
Internal Medicine Clinic Attending  Case discussed with Dr. White  At the time of the visit.  We reviewed the resident's history and exam and pertinent patient test results.  I agree with the assessment, diagnosis, and plan of care documented in the resident's note.  

## 2022-06-18 NOTE — Addendum Note (Signed)
Addended by: Lalla Brothers T on: 06/18/2022 08:48 AM   Modules accepted: Level of Service

## 2022-06-19 ENCOUNTER — Encounter: Payer: Medicare Other | Admitting: Student

## 2022-06-19 ENCOUNTER — Telehealth: Payer: Self-pay

## 2022-06-19 NOTE — Telephone Encounter (Signed)
Sonya Lane with [facility] requesting to speak with a nurse to clarify metFORMIN (GLUCOPHAGE-XR) 500 MG 24 hr tablet and lisinopril (ZESTRIL) 20 MG tablet. Requesting a call back today, so they can deliver the Rx to pt. Reference number # [id number].

## 2022-06-19 NOTE — Telephone Encounter (Addendum)
Returned call to [employer] at [facility]. Confirmed per OV note on 1/12 that current Rx for metformin ER is correct.  Confirmed with Attending that HCTZ was discontinued 2/2 hypercalcemia. Lisinopril 20 mg Rx is correct. Discussed this with Saralyn Pilar at [facility].

## 2022-06-20 LAB — LIPID PANEL
Chol/HDL Ratio: 4.1 ratio (ref 0.0–4.4)
Cholesterol, Total: 248 mg/dL — ABNORMAL HIGH (ref 100–199)
HDL: 60 mg/dL (ref >39)
LDL Chol Calc (NIH): 163 mg/dL — ABNORMAL HIGH (ref 0–99)
Triglycerides: 137 mg/dL (ref 0–149)
VLDL Cholesterol Cal: 25 mg/dL (ref 5–40)

## 2022-06-20 LAB — BMP8+ANION GAP
Anion Gap: 18 mmol/L (ref 10.0–18.0)
BUN/Creatinine Ratio: 11 — ABNORMAL LOW (ref 12–28)
BUN: 10 mg/dL (ref 8–27)
CO2: 26 mmol/L (ref 20–29)
Calcium: 11 mg/dL — ABNORMAL HIGH (ref 8.7–10.3)
Chloride: 96 mmol/L (ref 96–106)
Creatinine, Ser: 0.9 mg/dL (ref 0.57–1.00)
Glucose: 148 mg/dL — ABNORMAL HIGH (ref 70–99)
Potassium: 4.1 mmol/L (ref 3.5–5.2)
Sodium: 140 mmol/L (ref 134–144)
eGFR: 68 mL/min/{1.73_m2} (ref >59)

## 2022-06-20 LAB — ALDOSTERONE + RENIN ACTIVITY W/ RATIO
Aldos/Renin Ratio: 1.3 (ref 0.0–30.0)
Aldosterone: 7.2 ng/dL (ref 0.0–30.0)
Renin Activity, Plasma: 5.478 ng/mL/hr — ABNORMAL HIGH (ref 0.167–5.380)

## 2022-07-01 ENCOUNTER — Encounter: Payer: Self-pay | Admitting: Student

## 2022-07-01 ENCOUNTER — Other Ambulatory Visit: Payer: Self-pay

## 2022-07-01 ENCOUNTER — Ambulatory Visit (INDEPENDENT_AMBULATORY_CARE_PROVIDER_SITE_OTHER): Payer: Medicare Other | Admitting: Student

## 2022-07-01 VITALS — BP 120/70 | HR 83 | Temp 97.8°F | Ht 60.0 in | Wt 265.9 lb

## 2022-07-01 DIAGNOSIS — I10 Essential (primary) hypertension: Secondary | ICD-10-CM | POA: Diagnosis not present

## 2022-07-01 DIAGNOSIS — E782 Mixed hyperlipidemia: Secondary | ICD-10-CM

## 2022-07-01 NOTE — Patient Instructions (Signed)
Thank you, Ms.[name] for allowing [place] to provide your care today. Today we discussed your blood pressure, cholesterol and recent lab work..    Blood pressure is normal so continue taking your medications as prescribed.  For your cholesterol, since you are not taking any medications for this, it is important to eat healthy meals to help reduce the cholesterol.  I have attached resources with information about what type of foods to eat to help decrease your cholesterol.  My Chart Access: [email or web address]  Please follow-up in 3 months  Please make sure to arrive 15 minutes prior to your next appointment. If you arrive late, you may be asked to reschedule.    We look forward to seeing you next time. Please call our clinic at [phone] if you have any questions or concerns. The best time to call is Monday-Friday from 9am-4pm, but there is someone available 24/7. If after hours or the weekend, call the main hospital number and ask for the Internal Medicine Resident On-Call. If you need medication refills, please notify your pharmacy one week in advance and they will send [place] a request.   Thank you for letting us take part in your care. Wishing you the best!  Lacinda Axon, MD 07/01/2022, 11:33 AM IM Resident, PGY-3 [facility] Milian 41:10

## 2022-07-01 NOTE — Progress Notes (Unsigned)
   CC: BP follow-up  HPI:  Sonya Lane is a 72 y.o. female with PMH as below who presents to clinic to follow-up on her blood pressure. Please see problem based charting for evaluation, assessment and plan.  Past Medical History:  Diagnosis Date   Chest pain 02/16/2019   DIABETES MELLITUS, TYPE II 03/17/2006        HYPERLIPIDEMIA 03/17/2006   Qualifier: Diagnosis of  By: Sharlet Salina MD, [place]     HYPERTENSION 03/17/2006        Morbid obesity ([place]) 01/25/2008   Qualifier: Diagnosis of  By: Phifer MD, Izora Gala     Statin intolerance 12/11/2015    Review of Systems:  Constitutional: Negative for fever or fatigue Eyes: Negative for visual changes Respiratory: Negative for shortness of breath Cardiac: Negative for chest pain MSK: Negative for back pain Abdomen: Negative for abdominal pain, constipation or diarrhea Neuro: Negative for headache or weakness  Physical Exam: General: Pleasant *** No acute distress. Cardiac: RRR. No murmurs, rubs or gallops. No LE edema Respiratory: Lungs CTAB. No wheezing or crackles. Abdominal: Soft, symmetric and non tender. Normal BS. Skin: Warm, dry and intact without rashes or lesions Extremities: Atraumatic. Full ROM. Palpable radial and DP pulses. Neuro: A&O x 3. Moves all extremities Psych: Appropriate mood and affect.  Vitals:   07/01/22 1056  BP: 120/70  Pulse: 83  Temp: 97.8 F (36.6 C)  TempSrc: Oral  SpO2: 93%  Weight: 265 lb 14.4 oz (120.6 kg)  Height: 5' (1.524 m)    Assessment & Plan:   No problem-specific Assessment & Plan notes found for this encounter.    See Encounters Tab for problem based charting.  Patient discussed with Dr.  Dani Gobble, MD, MPH

## 2022-07-02 ENCOUNTER — Ambulatory Visit (INDEPENDENT_AMBULATORY_CARE_PROVIDER_SITE_OTHER): Payer: Medicare Other | Admitting: *Deleted

## 2022-07-02 VITALS — BP 120/70 | HR 83 | Temp 97.8°F | Ht 60.0 in | Wt 265.9 lb

## 2022-07-02 DIAGNOSIS — Z0000 Encounter for general adult medical examination without abnormal findings: Secondary | ICD-10-CM | POA: Diagnosis not present

## 2022-07-02 NOTE — Patient Instructions (Signed)

## 2022-07-02 NOTE — Telephone Encounter (Signed)
Submitted RE-ENROLLMENT application for [facility] to [facility] for patient assistance.   Phone: [phone]

## 2022-07-02 NOTE — Progress Notes (Signed)
Subjective:   Sonya Lane is a 72 y.o. female who presents for Medicare Annual (Subsequent) preventive examination. I connected with  [name] on 07/02/22 by a audio enabled telemedicine application and verified that I am speaking with the correct person using two identifiers.  Patient Location: Home  Provider Location: Office/Clinic  I discussed the limitations of evaluation and management by telemedicine. The patient expressed understanding and agreed to proceed.   Review of Systems    Defer to pcp       Objective:    Today's Vitals   07/02/22 1658  PainSc: 5    There is no height or weight on file to calculate BMI.     07/02/2022    4:51 PM 03/20/2022    1:16 PM 12/02/2021   10:30 AM 07/25/2021   10:26 AM 04/23/2021    3:07 PM 10/31/2020   10:06 AM 03/09/2020    2:14 PM  Advanced Directives  Does Patient Have a Medical Advance Directive? No Yes Yes Yes No Yes Yes  Type of Corporate treasurer of [place];Living will Living will;Healthcare Power of Attorney Living will;Healthcare Power of Attorney  Living will;Healthcare Power of [place];Living will  Does patient want to make changes to medical advance directive?  No - Patient declined No - Patient declined   No - Patient declined No - Patient declined  Copy of [place] in Chart?  No - copy requested No - copy requested   No - copy requested No - copy requested  Would patient like information on creating a medical advance directive? No - Patient declined    No - Patient declined      Current Medications (verified) Outpatient Encounter Medications as of 07/02/2022  Medication Sig   Accu-Chek FastClix Lancets MISC CHECK BLOOD SUGAR ONCE  DAILY   amLODipine (NORVASC) 10 MG tablet Take 1 tablet (10 mg total) by mouth at bedtime.   atorvastatin (LIPITOR) 40 MG tablet TAKE 1 TABLET BY MOUTH DAILY   Blood Glucose Monitoring Suppl (ACCU-CHEK GUIDE)  w/Device KIT Check blood sugar 1 time daily or up to 7 times a week   carvedilol (COREG) 25 MG tablet Take 1 tablet (25 mg total) by mouth 2 (two) times daily.   Dulaglutide (TRULICITY) 4.5 DJ/4.9FW SOPN Inject 4.5 mg as directed once a week.   gabapentin (NEURONTIN) 300 MG capsule Take 1 capsule (300 mg total) by mouth 3 (three) times daily.   glucose blood (ACCU-CHEK GUIDE) test strip Use to check blood sugar once daily   linaclotide (LINZESS) 290 MCG CAPS capsule Take 1 capsule (290 mcg total) by mouth daily before breakfast.   lisinopril (ZESTRIL) 20 MG tablet Take 1 tablet (20 mg total) by mouth daily.   metFORMIN (GLUCOPHAGE-XR) 500 MG 24 hr tablet Take 2 tablets (1,000 mg total) by mouth 2 (two) times daily with a meal.   Multiple Vitamins-Minerals (ONE-A-DAY 50 PLUS PO) Take 1 tablet by mouth in the morning.   nystatin cream (MYCOSTATIN) Apply 1 Application topically 2 (two) times daily.   No facility-administered encounter medications on file as of 07/02/2022.    Allergies (verified) Simvastatin   History: Past Medical History:  Diagnosis Date   Chest pain 02/16/2019   DIABETES MELLITUS, TYPE II 03/17/2006        HYPERLIPIDEMIA 03/17/2006   Qualifier: Diagnosis of  By: Sharlet Salina MD, [place]     HYPERTENSION 03/17/2006  Morbid obesity ([place]) 01/25/2008   Qualifier: Diagnosis of  By: Phifer MD, Izora Gala     Statin intolerance 12/11/2015   Past Surgical History:  Procedure Laterality Date   ABDOMINAL HYSTERECTOMY  1997   Family History  Problem Relation Age of Onset   Cancer Mother 53       Ovarian CA   Diabetes Mother    Cancer Father        Lung   Diabetes Sister    Arthritis Sister    Varicose Veins Sister    Diabetes Sister    Diabetes Brother    Diabetes Brother    Social History   Socioeconomic History   Marital status: Widowed    Spouse name: Not on file   Number of children: Not on file   Years of education: 13   Highest education level: Not on file   Occupational History   Occupation: Retired    Comment: Marine scientist Aide   Occupation: Disabled  Tobacco Use   Smoking status: Never   Smokeless tobacco: Never  Vaping Use   Vaping Use: Never used  Substance and Sexual Activity   Alcohol use: No   Drug use: No   Sexual activity: Not Currently  Other Topics Concern   Not on file  Social History Narrative   Current Social History 03/04/2019        Patient lives alone in a second floor apartment. There are 18 steps with handrails up to the entrance the patient uses.       Patient's method of transportation is personal car.      The highest level of education was some college.      The patient currently disabled.      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 Ducatte, BSN, RN-BC       Social Determinants of Health   Financial Resource Strain: Low Risk  (07/02/2022)   Overall Financial Resource Strain (CARDIA)    Difficulty of Paying Living Expenses: Not hard at all  Food Insecurity: No Food Insecurity (07/01/2022)   Hunger Vital Sign    Worried About Running Out of Food in the Last Year: Never true    Ran Out of Food in the Last Year: Never true  Transportation Needs: No Transportation Needs (07/01/2022)   PRAPARE - Hydrologist (Medical): No    Lack of Transportation (Non-Medical): No  Physical Activity: Insufficiently Active (07/02/2022)   Exercise Vital Sign    Days of Exercise per Week: 3 days    Minutes of Exercise per Session: 40 min  Stress: No Stress Concern Present (07/02/2022)   [place]    Feeling  of Stress : Not at all  Social Connections: Moderately Integrated (07/01/2022)   Social Connection and Isolation Panel [NHANES]    Frequency of Communication with Friends and Family: More than three times a week    Frequency of Social Gatherings with Friends and Family: More than three times a week  Attends Religious Services: More than 4 times per year    Active Member of Clubs or Organizations: Yes    Attends Archivist Meetings: 1 to 4 times per year    Marital Status: Widowed    Tobacco Counseling Counseling given: Not Answered   Clinical Intake:  Pre-visit preparation completed: Yes  Pain : 0-10 Pain Score: 5  Pain Type: Acute pain Pain Location: Back Pain Orientation: Lower Pain Onset: In the past 7 days Pain Frequency: Constant Pain Relieving Factors: medication Effect of Pain on Daily Activities: cant walk or stand long  Pain Relieving Factors: medication  BMI - recorded: 51.93 Nutritional Status: BMI > 30  Obese Nutritional Risks: None Diabetes: Yes CBG done?: No CBG resulted in Enter/ Edit results?: No Did pt. bring in CBG monitor from home?: No  How often do you need to have someone help you when you read instructions, pamphlets, or other written materials from your doctor or pharmacy?: 1 - Never What is the last grade level you completed in school?: some college Nutrition Risk Assessment:  Has the patient had any N/V/D within the last 2 months?  No  Does the patient have any non-healing wounds?  No  Has the patient had any unintentional weight loss or weight gain?  No   Diabetes:  Is the patient diabetic?  Yes  If diabetic, was a CBG obtained today?   N/a Did the patient bring in their glucometer from home?   N/a How often do you monitor your CBG's? Occasional .   Financial Strains and Diabetes Management:  Are you having any financial strains with the device, your supplies or your medication? No .  Does the patient want to be seen by  Chronic Care Management for management of their diabetes?  No  Would the patient like to be referred to a Nutritionist or for Diabetic Management?  No   Diabetic Exams:  Diabetic Eye Exam: Completed Jan 2024 Diabetic Foot Exam: Completed 07/25/2021    Interpreter Needed?: No  Information entered by :: [email or web address]   Activities of Daily Living    07/02/2022    5:01 PM 07/01/2022   10:51 AM  In your present state of health, do you have any difficulty performing the following activities:  Hearing? 1 1  Vision? 1 1  Difficulty concentrating or making decisions? 1 1  Walking or climbing stairs? 1 1  Dressing or bathing? 0 0  Doing errands, shopping? 0 0    Patient Care Team: Linward Natal, MD as PCP - General Rutherford Guys, MD as Consulting Physician (Ophthalmology)  Indicate any recent Medical Services you may have received from other than [facility] providers in the past year (date may be approximate).     Assessment:   This is a routine wellness examination for Dajanae.  Hearing/Vision screen No results found.  Dietary issues and exercise activities discussed:     Goals Addressed   None   Depression Screen    07/02/2022    5:01 PM 07/01/2022   10:52 AM 06/13/2022   10:28 AM 12/02/2021   11:01 AM 07/25/2021   10:25 AM 10/31/2020   11:47 AM 03/09/2020    2:16 PM  PHQ 2/9 Scores  PHQ - 2 Score 0 0 0 0 0 0 1  PHQ- 9 Score 6 6   0  11    Fall Risk    07/01/2022   10:50 AM 03/20/2022    1:16 PM 12/02/2021   10:29 AM 07/25/2021  10:25 AM 04/23/2021    3:06 PM  Fall Risk   Falls in the past year? 1 0 0 0 1  Number falls in past yr: 1 0 0 0 0  Comment BENDING OVER AND FELL      Injury with Fall? 0 0 0 0   Risk for fall due to : No Fall Risks   No Fall Risks   Follow up  Falls evaluation completed Falls evaluation completed Falls evaluation completed;Falls prevention discussed Falls evaluation completed    FALL RISK PREVENTION PERTAINING TO THE HOME:  Any stairs in  or around the home? Yes  If so, are there any without handrails? Yes  Home free of loose throw rugs in walkways, pet beds, electrical cords, etc? Yes  Adequate lighting in your home to reduce risk of falls? Yes   ASSISTIVE DEVICES UTILIZED TO PREVENT FALLS:  Life alert? No  Use of a cane, Punches or w/c? Yes  Grab bars in the bathroom? No  Shower chair or bench in shower? Yes  Elevated toilet seat or a handicapped toilet? No   TIMED UP AND GO:  Was the test performed? No .  Length of time to ambulate 10 feet: 0 sec.     Cognitive Function:        07/02/2022    5:03 PM  6CIT Screen  What Year? 0 points  What month? 0 points  What time? 0 points  Count back from 20 2 points  Months in reverse 4 points  Repeat phrase 0 points  Total Score 6 points    Immunizations Immunization History  Administered Date(s) Administered   Fluad Quad(high Dose 65+) 07/19/2020, 04/23/2021, 06/13/2022   H1N1 07/05/2008   Influenza Split 02/20/2011   Influenza Whole 03/16/2007, 03/14/2008, 02/11/2010   Influenza, High Dose Seasonal PF 06/07/2018   Influenza,inj,Quad PF,6+ Mos 02/04/2013, 02/09/2014, 04/13/2015, 02/25/2017, 02/14/2019   Moderna Sars-Covid-2 Vaccination 10/04/2019, 11/01/2019   PNEUMOCOCCAL CONJUGATE-20 06/13/2022   Pneumococcal Conjugate-13 04/13/2015   Pneumococcal Polysaccharide-23 03/14/2008, 06/10/2013   Td 07/25/2021   Tdap 02/20/2011    TDAP status: Up to date  Flu Vaccine status: Up to date  Pneumococcal vaccine status: Up to date  Covid-19 vaccine status: Information provided on how to obtain vaccines.   Qualifies for Shingles Vaccine? No   Zostavax completed No   Shingrix Completed?: No.    Education has been provided regarding the importance of this vaccine. Patient has been advised to call insurance company to determine out of pocket expense if they have not yet received this vaccine. Advised may also receive vaccine at local pharmacy or Health Dept.  Verbalized acceptance and understanding.  Screening Tests Health Maintenance  Topic Date Due   Zoster Vaccines- Shingrix (1 of 2) Never done   COVID-19 Vaccine (3 - 2023-24 season) 01/31/2022   OPHTHALMOLOGY EXAM  04/18/2022   FOOT EXAM  07/25/2022   HEMOGLOBIN A1C  09/12/2022   Diabetic kidney evaluation - eGFR measurement  06/14/2023   Diabetic kidney evaluation - Urine ACR  06/14/2023   LIPID PANEL  06/14/2023   Medicare Annual Wellness (AWV)  07/03/2023   MAMMOGRAM  11/29/2023   COLONOSCOPY (Pts 45-62yr Insurance coverage will need to be confirmed)  04/24/2027   DTaP/Tdap/Td (3 - Td or Tdap) 07/26/2031   Pneumonia Vaccine 74 Years old  Completed   INFLUENZA VACCINE  Completed   DEXA SCAN  Completed   Hepatitis C Screening  Completed   HPV VACCINES  Aged Out  Health Maintenance  Health Maintenance Due  Topic Date Due   Zoster Vaccines- Shingrix (1 of 2) Never done   COVID-19 Vaccine (3 - 2023-24 season) 01/31/2022   OPHTHALMOLOGY EXAM  04/18/2022    Colorectal cancer screening: Type of screening: Colonoscopy. Completed 04/23/2022. Repeat every 5 years  Mammogram status: Completed 11/28/2021. Repeat every year Dexa completed 05/06/2018   Lung Cancer Screening: (Low Dose CT Chest recommended if Age 58-80 years, 30 pack-year currently smoking OR have quit w/in 15years.) does not qualify.   Lung Cancer Screening Referral: n/a  Additional Screening:  Hepatitis C Screening: does not qualify; Completed 06/10/2013   Vision Screening: Recommended annual ophthalmology exams for early detection of glaucoma and other disorders of the eye. Is the patient up to date with their annual eye exam?  Yes  Who is the provider or what is the name of the office in which the patient attends annual eye exams? [facility] If pt is not established with a provider, would they like to be referred to a provider to establish care? No .   Dental Screening: Recommended annual dental exams  for proper oral hygiene  Community Resource Referral / Chronic Care Management: CRR required this visit?  No   CCM required this visit?  No      Plan:     I have personally reviewed and noted the following in the patient's chart:   Medical and social history Use of alcohol, tobacco or illicit drugs  Current medications and supplements including opioid prescriptions. Patient is not currently taking opioid prescriptions. Functional ability and status Nutritional status Physical activity Advanced directives List of other physicians Hospitalizations, surgeries, and ER visits in previous 12 months Vitals Screenings to include cognitive, depression, and falls Referrals and appointments  In addition, I have reviewed and discussed with patient certain preventive protocols, quality metrics, and best practice recommendations. A written personalized care plan for preventive services as well as general preventive health recommendations were provided to patient.     Nicoletta Dress, [place]   07/02/2022   Nurse Notes:  Ms. Bencomo , Thank you for taking time to come for your Medicare Wellness Visit. I appreciate your ongoing commitment to your health goals. Please review the following plan we discussed and let me know if I can assist you in the future.   These are the goals we discussed:  Goals      Blood Pressure < 140/90     HEMOGLOBIN A1C < 7.0     LDL CALC < 100     Weight < 265 lb (120.203 kg)        This is a list of the screening recommended for you and due dates:  Health Maintenance  Topic Date Due   Zoster (Shingles) Vaccine (1 of 2) Never done   COVID-19 Vaccine (3 - 2023-24 season) 01/31/2022   Eye exam for diabetics  04/18/2022   Complete foot exam   07/25/2022   Hemoglobin A1C  09/12/2022   Yearly kidney function blood test for diabetes  06/14/2023   Yearly kidney health urinalysis for diabetes  06/14/2023   Lipid (cholesterol) test  06/14/2023   Medicare  Annual Wellness Visit  07/03/2023   Mammogram  11/29/2023   Colon Cancer Screening  04/24/2027   DTaP/Tdap/Td vaccine (3 - Td or Tdap) 07/26/2031   Pneumonia Vaccine  Completed   Flu Shot  Completed   DEXA scan (bone density measurement)  Completed   Hepatitis C Screening: USPSTF Recommendation  to screen - Ages 58-79 yo.  Completed   HPV Vaccine  Aged Out

## 2022-07-03 ENCOUNTER — Encounter: Payer: Self-pay | Admitting: Student

## 2022-07-03 NOTE — Assessment & Plan Note (Signed)
Patient here to follow-up on her blood pressure. During her last office visit 3 weeks ago, patient HCTZ was discontinued due to mild hypercalcemia with calcium of 11.0. On chart review, patient has had mild hypercalcemia as far back as 5 years ago with calcium ranging from 10.4-10.6. Patient was advised to continue taking lisinopril 20 mg daily and follow-up for repeat BP.  Patient's BP today is well-controlled with SBP in the 120s. Patient reports she takes a One-A-Day for women but does not take any calcium supplement. Her milk intake is minimal and only with her coffee in the morning. She denies any headaches, dizziness, nausea, vomiting or blurry vision. Vitals:   07/01/22 1056  BP: 120/70   Plan: -Continue amlodipine 10 mg daily -Continue lisinopril 20 mg daily -Continue Coreg 25 mg twice daily -Advised to drink plenty of water daily. -Follow-up in 3 months for repeat BP and BMP

## 2022-07-03 NOTE — Assessment & Plan Note (Addendum)
Lipid panel during last office visit showed LDL of 163 from 68 to 1-year ago. Patient reports that she has stopped taking her Lipitor because of how it makes her feel.  States she had racing thoughts while on the Lipitor and has difficult time sleeping. Discussed starting an alternative statin however patient states she has tried Crestor in the past which made her lose her hair. She is adamant about not taking any medication for her cholesterol. Counseled patient that if she does not want to take any medication, it is important for her to make some dietary changes to help decrease her cholesterol. Patient requested information about what she can eat and she does not cook often. Discussed with patient importance of decreasing intake of fatty foods, processed foods and red meat and increasing her intake of vegetables, fish, whole grains, legumes, nuts and fruits.  Plan: -Continue lifestyle modifications with dietary changes -Check lipid panel in 6 months -Can consider pravastatin, simvastatin or even Zetia if LDL remains above goal.

## 2022-07-04 NOTE — Progress Notes (Signed)
Internal Medicine Clinic Attending  Case discussed with Dr. Amponsah  At the time of the visit.  We reviewed the resident's history and exam and pertinent patient test results.  I agree with the assessment, diagnosis, and plan of care documented in the resident's note.  

## 2022-08-04 ENCOUNTER — Telehealth: Payer: Self-pay

## 2022-08-04 ENCOUNTER — Other Ambulatory Visit (HOSPITAL_COMMUNITY): Payer: Self-pay

## 2022-08-04 NOTE — Telephone Encounter (Signed)
Pt called to follow up on application status.  [place] needs new rx pge sent with updated provider.  Will complete and fax.

## 2022-08-04 NOTE — Telephone Encounter (Signed)
Patient submitted re-enrollment for Trulicity to company in Oct 2023.  Called [employer] to follow up on enrollment/shipment status. No new enrollment on file. Needs to re-enroll for 2024

## 2022-08-13 ENCOUNTER — Other Ambulatory Visit: Payer: Self-pay

## 2022-08-13 DIAGNOSIS — E1142 Type 2 diabetes mellitus with diabetic polyneuropathy: Secondary | ICD-10-CM

## 2022-09-06 ENCOUNTER — Other Ambulatory Visit: Payer: Self-pay | Admitting: Internal Medicine

## 2022-09-06 DIAGNOSIS — E1142 Type 2 diabetes mellitus with diabetic polyneuropathy: Secondary | ICD-10-CM

## 2022-09-10 NOTE — Progress Notes (Signed)
Internal Medicine Clinic Attending  Case and documentation reviewed.  I reviewed the AWV findings.  I agree with the assessment, diagnosis, and plan of care documented in the AWV note.     

## 2022-09-12 ENCOUNTER — Telehealth: Payer: Self-pay

## 2022-09-12 NOTE — Telephone Encounter (Signed)
Received notification from [employer] regarding approval for LINZESS 290. Patient assistance approved from 09/10/22 to 06/02/23.  Medication will ship to pt's home  Phone: [phone]

## 2022-09-12 NOTE — Telephone Encounter (Signed)
New telephone note started 09/12/22

## 2022-09-29 ENCOUNTER — Ambulatory Visit: Payer: Medicare Other

## 2022-09-29 ENCOUNTER — Ambulatory Visit (INDEPENDENT_AMBULATORY_CARE_PROVIDER_SITE_OTHER): Payer: Medicare Other

## 2022-09-29 VITALS — BP 131/62 | HR 85 | Temp 98.1°F | Wt 262.2 lb

## 2022-09-29 DIAGNOSIS — I10 Essential (primary) hypertension: Secondary | ICD-10-CM

## 2022-09-29 DIAGNOSIS — Z6841 Body Mass Index (BMI) 40.0 and over, adult: Secondary | ICD-10-CM

## 2022-09-29 DIAGNOSIS — Z7984 Long term (current) use of oral hypoglycemic drugs: Secondary | ICD-10-CM

## 2022-09-29 DIAGNOSIS — Z794 Long term (current) use of insulin: Secondary | ICD-10-CM

## 2022-09-29 DIAGNOSIS — G59 Mononeuropathy in diseases classified elsewhere: Secondary | ICD-10-CM

## 2022-09-29 DIAGNOSIS — E1142 Type 2 diabetes mellitus with diabetic polyneuropathy: Secondary | ICD-10-CM | POA: Diagnosis not present

## 2022-09-29 LAB — POCT GLYCOSYLATED HEMOGLOBIN (HGB A1C): Hemoglobin A1C: 7 % — AB (ref 4.0–5.6)

## 2022-09-29 LAB — GLUCOSE, CAPILLARY: Glucose-Capillary: 134 mg/dL — ABNORMAL HIGH (ref 70–99)

## 2022-09-29 MED ORDER — AMLODIPINE BESYLATE 10 MG PO TABS: 10.0000 mg | ORAL_TABLET | Freq: Every evening | ORAL | 3 refills | Status: DC

## 2022-09-29 MED ORDER — LISINOPRIL 20 MG PO TABS: 20.0000 mg | ORAL_TABLET | Freq: Every day | ORAL | 11 refills | Status: DC

## 2022-09-29 MED ORDER — CARVEDILOL 25 MG PO TABS: 25.0000 mg | ORAL_TABLET | Freq: Two times a day (BID) | ORAL | 3 refills | Status: DC

## 2022-09-29 MED ORDER — METFORMIN HCL ER 500 MG PO TB24: 1000.0000 mg | ORAL_TABLET | Freq: Two times a day (BID) | ORAL | 3 refills | Status: DC

## 2022-09-29 MED ORDER — GABAPENTIN 300 MG PO CAPS: 300.0000 mg | ORAL_CAPSULE | Freq: Three times a day (TID) | ORAL | 3 refills | Status: DC

## 2022-09-29 NOTE — Assessment & Plan Note (Addendum)
Patient presents for follow-up of her type 2 diabetes.  A1c at last check was 7.6.  Patient is currently on Trulicity 4.5 mg weekly and metformin, which was switched at last follow-up to extended release 500 mg 2 tablets twice daily. Denies polyuria, polydipsia. Denies episodes of hypoglycemia.  Her polyneuropathy is significant and I suspect she may be suffering from Charcot deformity given exam findings.  Given stable gait and no tenderness or skin findings on exam, will not refer to podiatry for now. -Continue metformin XR 1000 mg twice daily -Continue Trulicity 4.5 mg weekly -Continue gabapentin 300 mg 3 times daily -Consider B12 level at future visit with concurrent labs -Return in 3 months for follow-up -Patient is up-to-date on her diabetic eye exams

## 2022-09-29 NOTE — Progress Notes (Signed)
   CC: A1c check  HPI:  Sonya Lane is a 72 y.o. with past medical history as below who presents for A1c check.  Please see detailed assessment and plan for HPI.  Past Medical History:  Diagnosis Date   Chest pain 02/16/2019   DIABETES MELLITUS, TYPE II 03/17/2006        HYPERLIPIDEMIA 03/17/2006   Qualifier: Diagnosis of  By: Okey Dupre MD, [place]     HYPERTENSION 03/17/2006        Morbid obesity ([facility]) 01/25/2008   Qualifier: Diagnosis of  By: Phifer MD, Harriett Sine     Statin intolerance 12/11/2015   Review of Systems: Please see detailed assessment and plan for pertinent ROS.  Physical Exam:  Vitals:   09/29/22 0948  BP: 131/62  Pulse: 85  Temp: 98.1 F (36.7 C)  TempSrc: Oral  SpO2: 98%  Weight: 262 lb 3.2 oz (118.9 kg)   Physical Exam Constitutional:      General: She is not in acute distress. HENT:     Head: Normocephalic and atraumatic.  Eyes:     Extraocular Movements: Extraocular movements intact.  Cardiovascular:     Rate and Rhythm: Normal rate and regular rhythm.     Pulses:          Dorsalis pedis pulses are 2+ on the right side and 2+ on the left side.     Heart sounds: No murmur heard. Pulmonary:     Effort: Pulmonary effort is normal.     Breath sounds: No wheezing, rhonchi or rales.  Musculoskeletal:     Cervical back: Neck supple.     Right lower leg: No edema.     Left lower leg: No edema.     Comments: Dorsiflexion and plantarflexion intact at bilateral ankles.  Feet:     Comments: Rocker-bottom deformity with swelling bilaterally.  Significantly reduced sensation at bottom of feet bilaterally.  Skin intact, no wounds or focal erythema identified. Skin:    General: Skin is warm and dry.  Neurological:     Mental Status: She is alert and oriented to person, place, and time.  Psychiatric:        Mood and Affect: Mood normal.        Behavior: Behavior normal.      Assessment & Plan:   See Encounters Tab for problem based  charting.  Type 2 diabetes mellitus with diabetic polyneuropathy [facility]) Patient presents for follow-up of her type 2 diabetes.  A1c at last check was 7.6.  Patient is currently on Trulicity 4.5 mg weekly and metformin, which was switched at last follow-up to extended release 500 mg 2 tablets twice daily. Denies polyuria, polydipsia. Denies episodes of hypoglycemia.  Her polyneuropathy is significant and I suspect she may be suffering from Charcot deformity given exam findings.  Given stable gait and no tenderness or skin findings on exam, will not refer to podiatry for now. -Continue metformin XR 1000 mg twice daily -Continue Trulicity 4.5 mg weekly -Continue gabapentin 300 mg 3 times daily -Consider B12 level at future visit with concurrent labs -Return in 3 months for follow-up -Patient is up-to-date on her diabetic eye exams  Patient discussed with Dr. Mikey Bussing

## 2022-09-29 NOTE — Patient Instructions (Signed)
[name], it was a pleasure seeing you today!  Today we discussed: Diabetes - continue with metformin and Trulicity. See you back in 3 months!  I have ordered the following labs today:   Lab Orders         Glucose, capillary         POC Hbg A1C      Tests ordered today:  none  Referrals ordered today:   Referral Orders  No referral(s) requested today     I have ordered the following medication/changed the following medications:   Stop the following medications: Medications Discontinued During This Encounter  Medication Reason   amLODipine (NORVASC) 10 MG tablet Reorder   carvedilol (COREG) 25 MG tablet Reorder   gabapentin (NEURONTIN) 300 MG capsule Reorder   lisinopril (ZESTRIL) 20 MG tablet Reorder   metFORMIN (GLUCOPHAGE-XR) 500 MG 24 hr tablet Reorder     Start the following medications: Meds ordered this encounter  Medications   amLODipine (NORVASC) 10 MG tablet    Sig: Take 1 tablet (10 mg total) by mouth at bedtime.    Dispense:  90 tablet    Refill:  3    Requesting 1 year supply   carvedilol (COREG) 25 MG tablet    Sig: Take 1 tablet (25 mg total) by mouth 2 (two) times daily.    Dispense:  180 tablet    Refill:  3    Requesting 1 year supply   gabapentin (NEURONTIN) 300 MG capsule    Sig: Take 1 capsule (300 mg total) by mouth 3 (three) times daily.    Dispense:  270 capsule    Refill:  3    Requesting 1 year supply   lisinopril (ZESTRIL) 20 MG tablet    Sig: Take 1 tablet (20 mg total) by mouth daily.    Dispense:  30 tablet    Refill:  11   metFORMIN (GLUCOPHAGE-XR) 500 MG 24 hr tablet    Sig: Take 2 tablets (1,000 mg total) by mouth 2 (two) times daily with a meal.    Dispense:  360 tablet    Refill:  3    Please send a replace/new response with 100-Day Supply if appropriate to maximize member benefit. Requesting 1 year supply.     Follow-up: 3 months   Please make sure to arrive 15 minutes prior to your next appointment. If  you arrive late, you may be asked to reschedule.   We look forward to seeing you next time. Please call our clinic at [phone] if you have any questions or concerns. The best time to call is Monday-Friday from 9am-4pm, but there is someone available 24/7. If after hours or the weekend, call the main hospital number and ask for the Internal Medicine Resident On-Call. If you need medication refills, please notify your pharmacy one week in advance and they will send [place] a request.  Thank you for letting us take part in your care. Wishing you the best!  Thank you, Adron Bene, MD

## 2022-10-01 NOTE — Progress Notes (Signed)
Internal Medicine Clinic Attending  Case discussed with the resident at the time of the visit.  We reviewed the resident's history and exam and pertinent patient test results.  I agree with the assessment, diagnosis, and plan of care documented in the resident's note.  

## 2022-10-07 NOTE — Addendum Note (Signed)
Addended bySharrell Ku on: 10/07/2022 02:59 PM   Modules accepted: Level of Service

## 2022-11-06 ENCOUNTER — Telehealth: Payer: Self-pay | Admitting: *Deleted

## 2022-11-06 NOTE — Telephone Encounter (Signed)
Call from [facility] Patient Advocate calling to see if a Stain needs to be ordered for the patient.  Patient due to guidelines should be on a Statin if appropriate and if not indicate on forms to be faxed to the office to complete. If no Statin is prescribed an ICD 10 Code will need to be placed on the form.   Call to patient to see what Pharmacy she would like the Statin called to if the doctor would like for her to be on a Statin .  Call to patient to ask which Pharmacy she would like to use for the Statin.  Patient stated she is on Lipitor already and does not at this time need a refill.

## 2022-11-18 DIAGNOSIS — R058 Other specified cough: Secondary | ICD-10-CM | POA: Diagnosis not present

## 2022-12-02 ENCOUNTER — Telehealth: Payer: Self-pay

## 2022-12-02 NOTE — Telephone Encounter (Signed)
Return pt's call. Stated she does not like the new metformin pill; Metformin 500 mg 24hr was prescribed and causing her to itch. She wants to go back to the plain Metformin 1000 mg tab. Stated she wanted a smaller size pill.

## 2022-12-02 NOTE — Telephone Encounter (Signed)
Patient called she stated she does not like taking metformin, patient is requesting a call back to discuss further.

## 2022-12-03 NOTE — Addendum Note (Signed)
Addended by: Lucille Passy on: 12/03/2022 06:37 PM   Modules accepted: Orders

## 2022-12-05 NOTE — Telephone Encounter (Signed)
On chart review, patient was on metformin 1000 mg BID and switched to metformin 500 mg XL BID due to GI upset in 1/24.  It appears dose was increased 4/24 to metformin 1000mg  XL BID.  I called to clarify with patient about going back on other formulation if she had GI side effects. I was unable to reach patient.

## 2022-12-05 NOTE — Telephone Encounter (Signed)
I called pt - no answer; left message on self-identified vm of my call.

## 2022-12-09 ENCOUNTER — Other Ambulatory Visit: Payer: Self-pay

## 2022-12-09 DIAGNOSIS — E1142 Type 2 diabetes mellitus with diabetic polyneuropathy: Secondary | ICD-10-CM

## 2022-12-09 NOTE — Telephone Encounter (Signed)
Rx to be sent to [facility]

## 2022-12-10 NOTE — Telephone Encounter (Signed)
Mailed application to patients home to restart re-enrollment.

## 2022-12-16 ENCOUNTER — Ambulatory Visit (INDEPENDENT_AMBULATORY_CARE_PROVIDER_SITE_OTHER): Payer: Medicare Other | Admitting: Student in an Organized Health Care Education/Training Program

## 2022-12-16 ENCOUNTER — Encounter: Payer: Self-pay | Admitting: Student in an Organized Health Care Education/Training Program

## 2022-12-16 VITALS — BP 131/62 | HR 85 | Wt 259.9 lb

## 2022-12-16 DIAGNOSIS — E785 Hyperlipidemia, unspecified: Secondary | ICD-10-CM

## 2022-12-16 DIAGNOSIS — I10 Essential (primary) hypertension: Secondary | ICD-10-CM | POA: Diagnosis not present

## 2022-12-16 DIAGNOSIS — Z7984 Long term (current) use of oral hypoglycemic drugs: Secondary | ICD-10-CM | POA: Diagnosis not present

## 2022-12-16 DIAGNOSIS — E1142 Type 2 diabetes mellitus with diabetic polyneuropathy: Secondary | ICD-10-CM

## 2022-12-16 DIAGNOSIS — E782 Mixed hyperlipidemia: Secondary | ICD-10-CM

## 2022-12-16 DIAGNOSIS — G59 Mononeuropathy in diseases classified elsewhere: Secondary | ICD-10-CM

## 2022-12-16 LAB — GLUCOSE, CAPILLARY: Glucose-Capillary: 124 mg/dL — ABNORMAL HIGH (ref 70–99)

## 2022-12-16 LAB — POCT GLYCOSYLATED HEMOGLOBIN (HGB A1C): Hemoglobin A1C: 6.8 % — AB (ref 4.0–5.6)

## 2022-12-16 MED ORDER — METFORMIN HCL 1000 MG PO TABS
1000.0000 mg | ORAL_TABLET | Freq: Two times a day (BID) | ORAL | 3 refills | Status: DC
Start: 2022-12-16 — End: 2023-09-08

## 2022-12-16 MED ORDER — LISINOPRIL 20 MG PO TABS
20.0000 mg | ORAL_TABLET | Freq: Every day | ORAL | 3 refills | Status: DC
Start: 2022-12-16 — End: 2023-07-06

## 2022-12-16 NOTE — Assessment & Plan Note (Signed)
Moderate bilateral peripheral neuropathy.  Foot exam normal today except for some trauma to the third digit of the left foot.  I think this will heal well.  Will continue gabapentin 300 mg 3 times daily, no side effects so far from this.  She has some imbalance with ambulation.  I am going to order a Dente to help with independence and reduce fall risk.

## 2022-12-16 NOTE — Progress Notes (Signed)
   Established Patient Office Visit  Subjective   Patient ID: Sonya Lane, female    DOB: 02-06-1951  Age: 72 y.o. MRN: [id number]  HPI  72 year old person here for follow-up of diabetes and hypertension.  Doing well since last office visit.  No acute concerns today.  Lives independently in an apartment.  No falls at home.  No recent ED visits or hospitalizations.  Reports good adherence with medications with acceptance of metformin.  Was trialed on an XR formulation which she did not like, the tablets were too large.  She started back taking an old prescription of generic metformin which she much prefers.  Symptoms of the tingling in her hands has improved.  She exercises 2 days a week, rides a bicycle for about 40 minutes.  Denies any chest pain or pressure with exertion.    Objective:     BP 131/62 (BP Location: Left Arm, Patient Position: Sitting, Cuff Size: Normal)   Pulse 85   Wt 259 lb 14.4 oz (117.9 kg)   SpO2 97%   BMI 50.76 kg/m    Physical Exam  Gen: Well-appearing woman, no distress, obese Neck: Normal, no thyromegaly CV: No murmurs Lungs: Unlabored clear throughout Ext: Warm and well-perfused with no lower extremity edema, missing 1 nail on the left third digit Neuro: Alert, conversational full strength in the upper and lower extremities, delayed get up and go, wide-based mildly unsteady gait    Assessment & Plan:   Problem List Items Addressed This Visit       Unprioritized   Type 2 diabetes mellitus with diabetic polyneuropathy (HCC) - Primary (Chronic)    Diabetes is well-controlled today with an A1c of 6.8%.  She has some peripheral neuropathy as a probable complication of diabetes.  No nephropathy or retinopathy.  No other symptoms of hyper or hypoglycemia.  Some confusion about metformin, she did not tolerate trying to switch to an XR formulation.  Did not like the size of the tablets.  I am going to represcribe generic metformin 1000 mg twice daily.   Continue with dulaglutide 4.5 mg weekly.  Lisinopril for renal protection.  Follow-up in 4 months with repeat A1c.      Relevant Medications   metFORMIN (GLUCOPHAGE) 1000 MG tablet   lisinopril (ZESTRIL) 20 MG tablet   Other Relevant Orders   POC Hbg A1C (Completed)   Hyperlipidemia (Chronic)   Relevant Medications   lisinopril (ZESTRIL) 20 MG tablet   Essential hypertension    Blood pressure is well-controlled today.  Plan to continue amlodipine 10 mg daily, carvedilol 25 mg twice daily and lisinopril 20 mg daily.      Relevant Medications   lisinopril (ZESTRIL) 20 MG tablet   Peripheral neuropathy    Moderate bilateral peripheral neuropathy.  Foot exam normal today except for some trauma to the third digit of the left foot.  I think this will heal well.  Will continue gabapentin 300 mg 3 times daily, no side effects so far from this.  She has some imbalance with ambulation.  I am going to order a [place] to help with independence and reduce fall risk.      Relevant Orders   Ambulatory Referral for DME    Return in about 4 months (around 04/18/2023).    Tyson Alias, MD

## 2022-12-16 NOTE — Assessment & Plan Note (Signed)
Blood pressure is well-controlled today.  Plan to continue amlodipine 10 mg daily, carvedilol 25 mg twice daily and lisinopril 20 mg daily.

## 2022-12-16 NOTE — Assessment & Plan Note (Signed)
Diabetes is well-controlled today with an A1c of 6.8%.  She has some peripheral neuropathy as a probable complication of diabetes.  No nephropathy or retinopathy.  No other symptoms of hyper or hypoglycemia.  Some confusion about metformin, she did not tolerate trying to switch to an XR formulation.  Did not like the size of the tablets.  I am going to represcribe generic metformin 1000 mg twice daily.  Continue with dulaglutide 4.5 mg weekly.  Lisinopril for renal protection.  Follow-up in 4 months with repeat A1c.

## 2022-12-16 NOTE — Telephone Encounter (Signed)
Pt here for visit with [facility] Inquires about Truclicity CMA informed pt CPhT mailed re-enrollment application on 07/10  Pt states she has yet to receive info in the mail Pt brings in copy of form [id number] Social Security Benefit Statement 2023 CMA will place in CPhT's box  Pt will await arrival of paperwork.

## 2022-12-22 ENCOUNTER — Telehealth: Payer: Self-pay

## 2022-12-22 NOTE — Telephone Encounter (Signed)
Patient called she stated she fell over the weekend, patient stated she is okay but today she is noticing some bruising. Patient is requesting a call back to discuss.

## 2022-12-24 NOTE — Telephone Encounter (Signed)
Mailed to pt home - 2nd attempt 12/24/22

## 2022-12-25 DIAGNOSIS — I10 Essential (primary) hypertension: Secondary | ICD-10-CM | POA: Diagnosis not present

## 2022-12-25 DIAGNOSIS — G59 Mononeuropathy in diseases classified elsewhere: Secondary | ICD-10-CM | POA: Diagnosis not present

## 2022-12-31 NOTE — Telephone Encounter (Signed)
Rec'd pt pages back - application in Dr. Andree Coss box awaiting provider signatures.

## 2023-01-01 ENCOUNTER — Telehealth: Payer: Self-pay | Admitting: Internal Medicine

## 2023-01-01 NOTE — Telephone Encounter (Signed)
Rec'd call from the pt stating the [facility] is unable to sch. Pt state

## 2023-01-06 NOTE — Telephone Encounter (Signed)
Submitted application for TRULICITY 4.'5MG'$  to [place] for patient assistance.   Phone: [phone]

## 2023-01-08 NOTE — Telephone Encounter (Signed)
Received notification from [facility] [employer] regarding approval for TRULICITY 4.5MG . Patient assistance approved from 01/06/23 to 06/02/23.  Medication will ship to patients home. Pt may call company regarding refills and shipments.  Phone: [phone]

## 2023-01-19 ENCOUNTER — Other Ambulatory Visit: Payer: Self-pay | Admitting: Internal Medicine

## 2023-01-19 DIAGNOSIS — Z1231 Encounter for screening mammogram for malignant neoplasm of breast: Secondary | ICD-10-CM

## 2023-02-13 ENCOUNTER — Ambulatory Visit
Admission: RE | Admit: 2023-02-13 | Discharge: 2023-02-13 | Disposition: A | Discharge status: Home / Self Care | Payer: Medicare Other | Source: Ambulatory Visit | Attending: Internal Medicine | Admitting: Internal Medicine

## 2023-02-13 DIAGNOSIS — Z1231 Encounter for screening mammogram for malignant neoplasm of breast: Secondary | ICD-10-CM

## 2023-02-18 IMAGING — MG MM DIGITAL SCREENING BILAT W/ TOMO AND CAD
8 of 14 series · 8 of 40 positions shown · non-contrast
Comparison: Previous exam(s).

CLINICAL DATA: Screening.

EXAM:
DIGITAL SCREENING BILATERAL MAMMOGRAM WITH TOMOSYNTHESIS AND CAD
TECHNIQUE: Bilateral screening digital craniocaudal and mediolateral oblique
mammograms were obtained. Bilateral screening digital breast
tomosynthesis was performed. The images were evaluated with
computer-aided detection.

[R CV synth-2D]
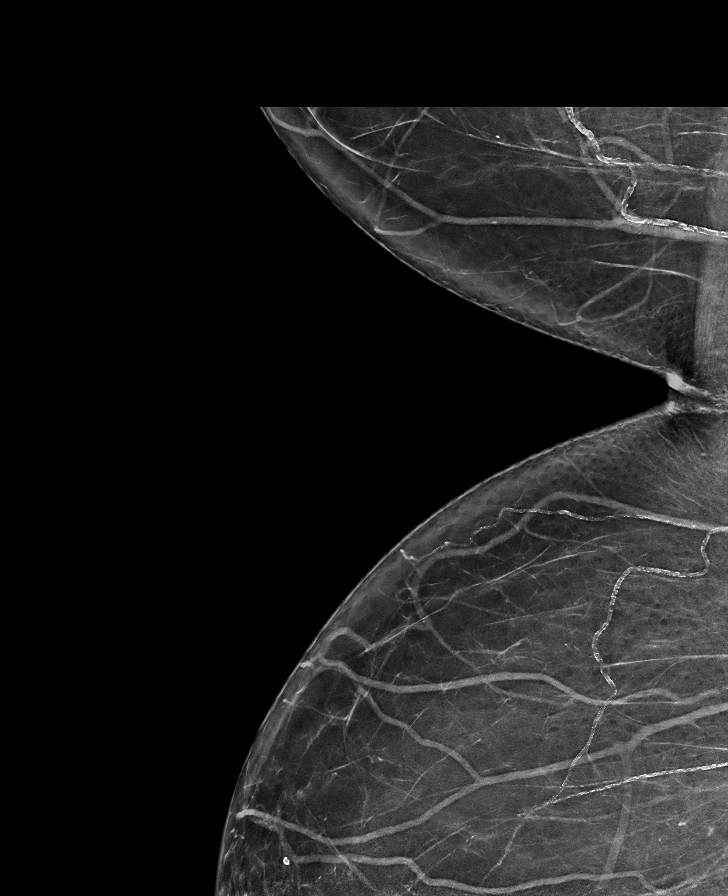

[L MLO synth-2D (1 of 2)]
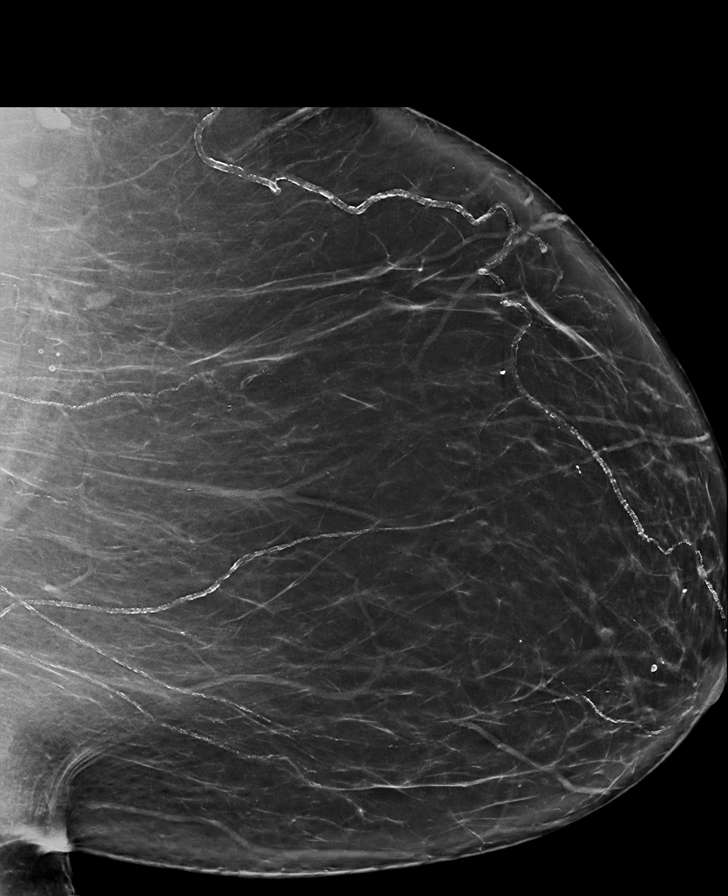

[L MLO synth-2D (2 of 2)]
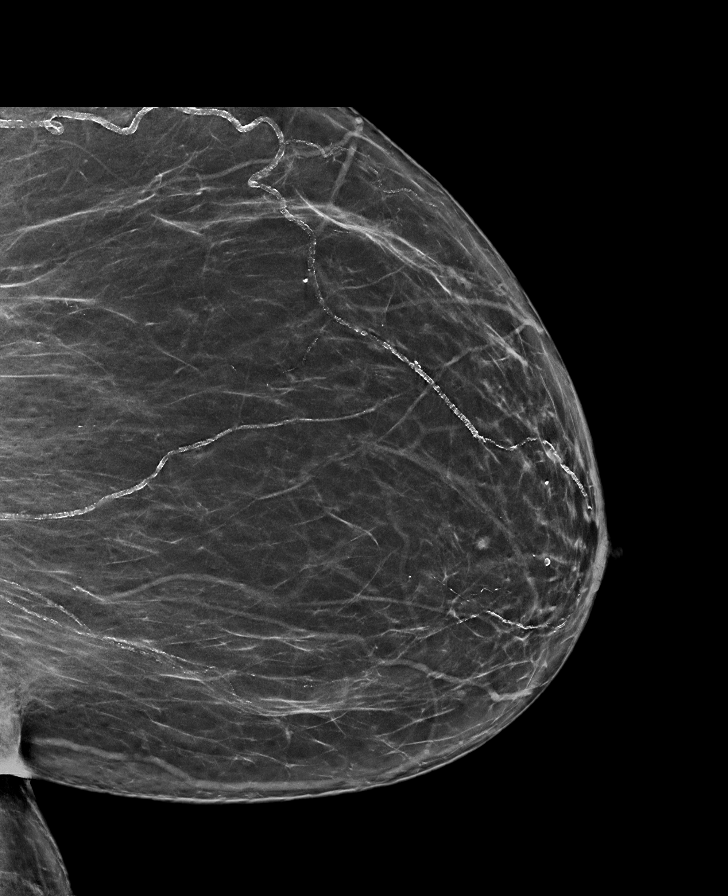

[R MLO synth-2D (1 of 2)]
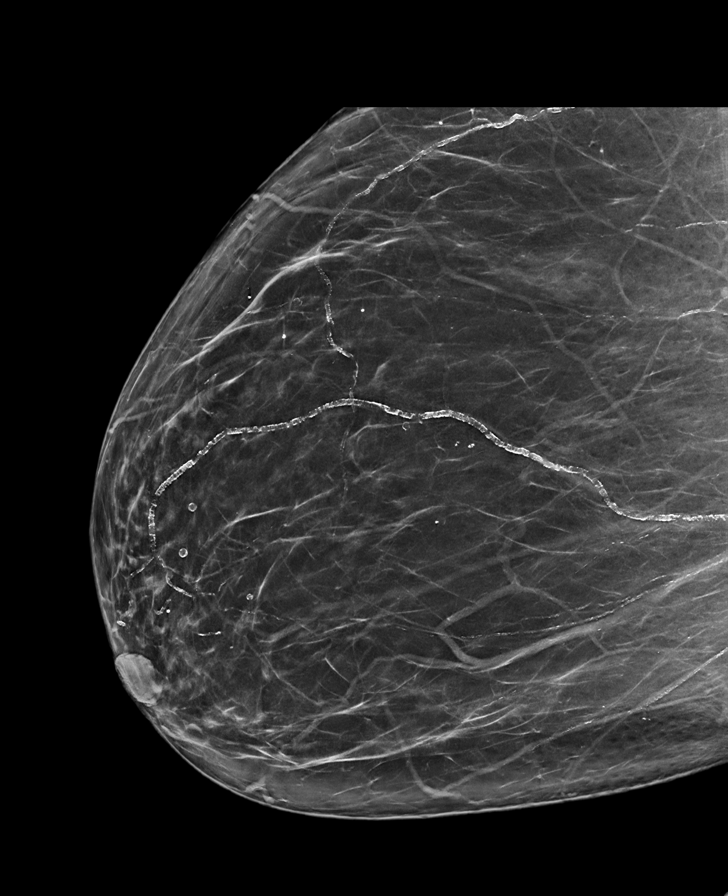

[R CC synth-2D]
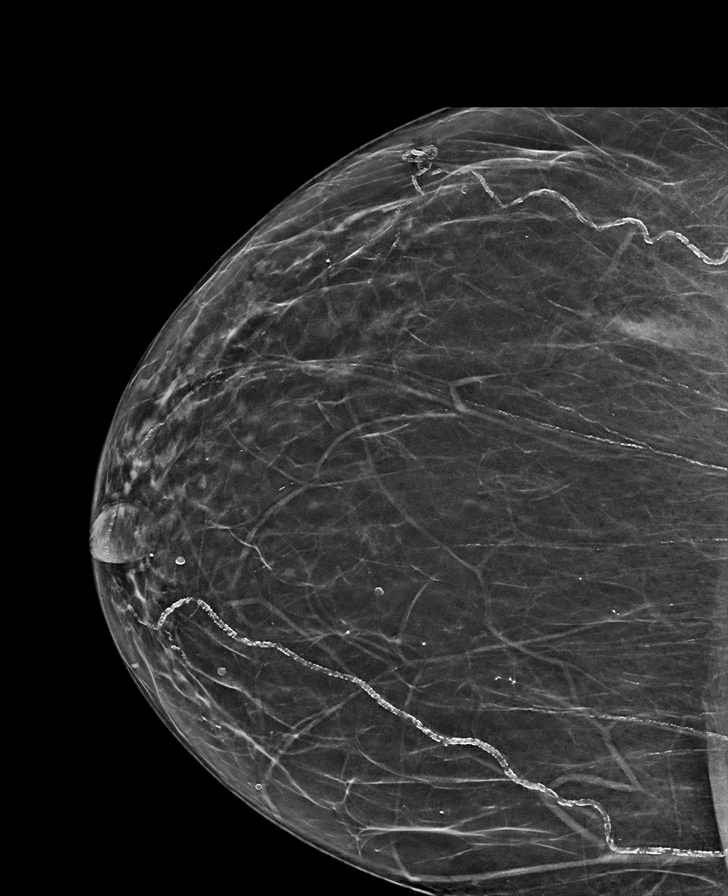

[L CC synth-2D]
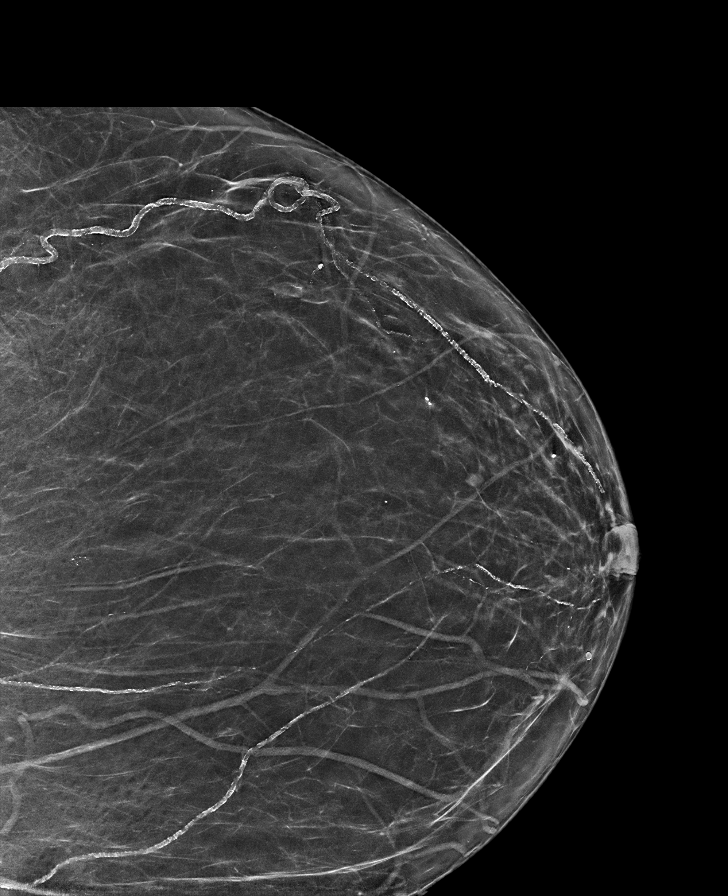

[R MLO synth-2D (2 of 2)]
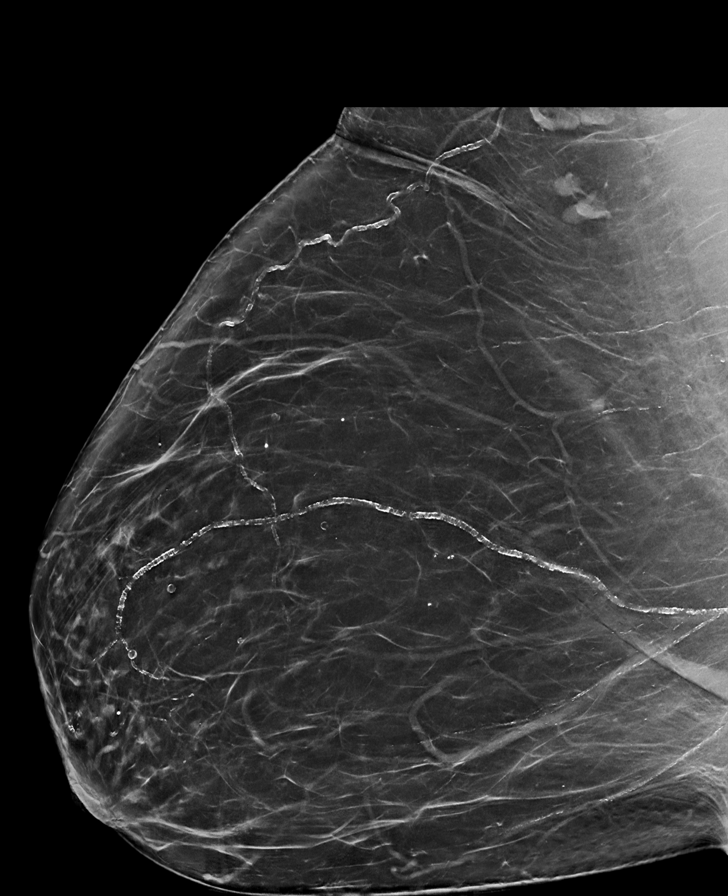

[R CC tomo · tomo slice 35/68.0]
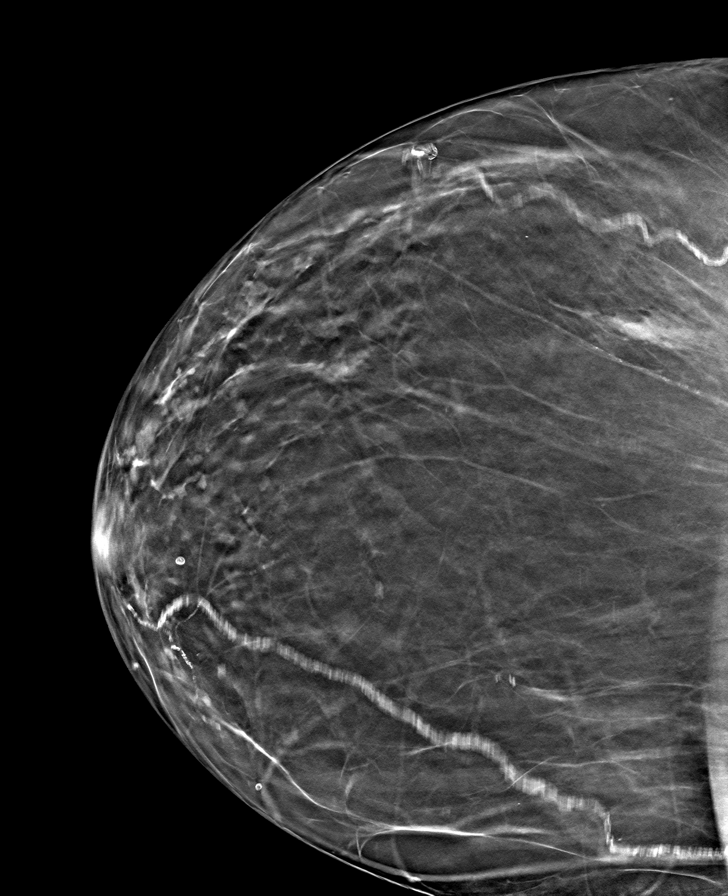

[8 of 40 positions shown; findings below may reference images not displayed]

ACR Breast Density Category b: There are scattered areas of
fibroglandular density.
FINDINGS: There are no findings suspicious for malignancy. The images were
evaluated with computer-aided detection.
IMPRESSION: No mammographic evidence of malignancy. A result letter of this
screening mammogram will be mailed directly to the patient.

RECOMMENDATION:
Screening mammogram in one year. (Code:WJ-I-BG6)

BI-RADS CATEGORY  1: Negative.

## 2023-06-05 ENCOUNTER — Telehealth: Payer: Self-pay

## 2023-06-05 ENCOUNTER — Other Ambulatory Visit (HOSPITAL_COMMUNITY): Payer: Self-pay

## 2023-06-05 DIAGNOSIS — E1142 Type 2 diabetes mellitus with diabetic polyneuropathy: Secondary | ICD-10-CM

## 2023-06-16 NOTE — Progress Notes (Deleted)
 Pharmacy Medication Assistance Program Note    07/23/2023  Patient ID: Sonya Lane, female   DOB: February 28, 1951, 73 y.o.   MRN: [id number]     06/05/2023  Outreach Medication One  Manufacturer Medication One [employer] Drugs Trulicity   Type of Assistance Manufacturer Assistance  Date Application Sent to Patient 06/16/2023  Application Items Requested Application  Date Application Received From Patient 07/21/2023  Application Items Received From Patient Application;Proof of Income    In process of completing provider pages.

## 2023-06-16 NOTE — Progress Notes (Deleted)
 Pharmacy Medication Assistance Program Note    06/16/2023  Patient ID: Sonya Lane, female  DOB: 04-09-1951, 73 y.o.  MRN:  [id number]     06/05/2023  Outreach Medication Two  Manufacturer Medication Two [employer]  [employer] Drugs Linzess   Type of Assistance Manufacturer Assistance  Date Application Sent to Patient 06/16/2023  Application Items Requested Application;Proof of Income    Mailed to pt home.

## 2023-06-23 NOTE — Progress Notes (Unsigned)
Subjective:  CC: ***  HPI:  [name] is a 72 y.o. female with a past medical history of type 2 diabetes c/b peripheral neuropathy, HLD, HTN   Please see problem based assessment and plan for additional details.  Past Medical History:  Diagnosis Date   Chest pain 02/16/2019   DIABETES MELLITUS, TYPE II 03/17/2006        HYPERLIPIDEMIA 03/17/2006   Qualifier: Diagnosis of  By: Okey Dupre MD, [place]     HYPERTENSION 03/17/2006        Morbid obesity (HCC) 01/25/2008   Qualifier: Diagnosis of  By: Phifer MD, Harriett Sine     Statin intolerance 12/11/2015    Current Outpatient Medications on File Prior to Visit  Medication Sig Dispense Refill   amLODipine (NORVASC) 10 MG tablet Take 1 tablet (10 mg total) by mouth at bedtime. 90 tablet 3   carvedilol (COREG) 25 MG tablet Take 1 tablet (25 mg total) by mouth 2 (two) times daily. 180 tablet 3   Dulaglutide (TRULICITY) 4.5 MG/0.5ML SOPN Inject 4.5 mg as directed once a week.     gabapentin (NEURONTIN) 300 MG capsule Take 1 capsule (300 mg total) by mouth 3 (three) times daily. 270 capsule 3   glucose blood (ACCU-CHEK GUIDE) test strip USE TO CHECK BLOOD SUGAR ONCE  DAILY 365 strip 2   linaclotide (LINZESS) 290 MCG CAPS capsule Take 1 capsule (290 mcg total) by mouth daily before breakfast. 90 capsule 3   lisinopril (ZESTRIL) 20 MG tablet Take 1 tablet (20 mg total) by mouth daily. 90 tablet 3   metFORMIN (GLUCOPHAGE) 1000 MG tablet Take 1 tablet (1,000 mg total) by mouth 2 (two) times daily with a meal. 180 tablet 3   Multiple Vitamins-Minerals (ONE-A-DAY 50 PLUS PO) Take 1 tablet by mouth in the morning.     No current facility-administered medications on file prior to visit.    Family History  Problem Relation Age of Onset   Cancer Mother 29       Ovarian CA   Diabetes Mother    Cancer Father        Lung   Diabetes Sister    Arthritis Sister    Varicose Veins Sister    Diabetes Sister    Diabetes Brother    Diabetes  Brother    Breast cancer Neg Hx     Social History   Socioeconomic History   Marital status: Widowed    Spouse name: Not on file   Number of children: Not on file   Years of education: 13   Highest education level: Not on file  Occupational History   Occupation: Retired    Comment: Engineer, civil (consulting) Aide   Occupation: Disabled  Tobacco Use   Smoking status: Never   Smokeless tobacco: Never  Vaping Use   Vaping status: Never Used  Substance and Sexual Activity   Alcohol use: No   Drug use: No   Sexual activity: Not Currently  Other Topics Concern   Not on file  Social History Narrative   Current Social History 03/04/2019        Patient lives alone in a second floor apartment. There are 18 steps with handrails up to the entrance the patient uses.       Patient's method of transportation is personal car.      The highest level of education was some college.      The patient currently disabled.      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[place], BSN, RN-BC       Social Drivers of Health   Financial Resource Strain: Low Risk  (07/02/2022)   Overall Financial Resource Strain (CARDIA)    Difficulty of Paying Living Expenses: Not hard at all  Food Insecurity: No Food Insecurity (07/01/2022)   Hunger Vital Sign    Worried About Running Out of Food in the Last Year: Never true    Ran Out of Food in the Last Year: Never true  Transportation Needs: No Transportation Needs (07/01/2022)   PRAPARE - Administrator, Civil Service (Medical): No    Lack of Transportation (Non-Medical): No  Physical Activity: Insufficiently Active (07/02/2022)    Exercise Vital Sign    Days of Exercise per Week: 3 days    Minutes of Exercise per Session: 40 min  Stress: No Stress Concern Present (07/02/2022)   [employer] of Occupational Health - Occupational Stress Questionnaire    Feeling of Stress : Not at all  Social Connections: Moderately Integrated (07/01/2022)   Social Connection and Isolation Panel [NHANES]    Frequency of Communication with Friends and Family: More than three times a week    Frequency of Social Gatherings with Friends and Family: More than three times a week    Attends Religious Services: More than 4 times per year    Active Member of [employer] or Organizations: Yes    Attends Banker Meetings: 1 to 4 times per year    Marital Status: Widowed  Intimate Partner Violence: Not At Risk (07/01/2022)   Humiliation, Afraid, Rape, and Kick questionnaire    Fear of Current or Ex-Partner: No    Emotionally Abused: No    Physically Abused: No    Sexually Abused: No    Review of Systems: ROS negative except for what is noted on the assessment and plan.  Objective:  There were no vitals filed for this visit.  Physical Exam: Constitutional: well-appearing *** sitting in ***, in no acute distress HENT: normocephalic atraumatic, mucous membranes moist Eyes: conjunctiva non-erythematous Neck: supple Cardiovascular: regular rate and rhythm, no m/r/g Pulmonary/Chest: normal work of breathing on room air, lungs clear to auscultation bilaterally Abdominal: soft, non-tender, non-distended MSK: normal bulk and tone Neurological: alert & oriented x 3, 5/5 strength in bilateral upper and lower extremities, normal gait Skin: warm and dry Psych: ***     Assessment & Plan:  No problem-specific Assessment & Plan notes found for this encounter.    DM Lab Results  Component Value Date   HGBA1C 6.8 (A) 12/16/2022  Last urine microalbumin/creatinine ratio of 308 06/13/22.   Home medications include metformin 1000 mg  BID and dulaglutide 4.5 mg weekly P: A1c, lipid panel, BMP, urine microalbumin/ creatinine ratio  HTN Home medications include lisinopril 20 mg, amlodipine 10 mg, and carvedilol 25 mg bid.  He previously was taking hydrochlorothiazide, this was discontinued with hypercalcemia.  HLD Lab Results  Component Value Date   CHOL 248 (H) 06/13/2022   HDL 60 06/13/2022   LDLCALC  163 (H) 06/13/2022   TRIG 137 06/13/2022   CHOLHDL 4.1 06/13/2022     Patient discussed with Dr. {[id number]::"[id number]","V. Hoffman","Mullen","Narendra","Machen","Vincent","Guilloud","Lau"}   [employer], D.O. [facility] Internal Medicine  PGY-3 Pager: [phone]  Phone: [phone] Date 06/23/2023  Time 2:35 PM

## 2023-06-24 ENCOUNTER — Encounter: Payer: Medicare Other | Admitting: Student

## 2023-06-24 DIAGNOSIS — E1142 Type 2 diabetes mellitus with diabetic polyneuropathy: Secondary | ICD-10-CM

## 2023-06-24 DIAGNOSIS — E782 Mixed hyperlipidemia: Secondary | ICD-10-CM

## 2023-06-30 NOTE — Telephone Encounter (Signed)
Pt called to let you know she received the application in the mail

## 2023-06-30 NOTE — Telephone Encounter (Signed)
Pt was calling to let you know she has received  the application  in the mail

## 2023-07-06 ENCOUNTER — Ambulatory Visit: Payer: Medicare Other | Admitting: Student

## 2023-07-06 VITALS — BP 146/82 | HR 83 | Temp 97.8°F | Ht 65.0 in | Wt 262.2 lb

## 2023-07-06 DIAGNOSIS — I10 Essential (primary) hypertension: Secondary | ICD-10-CM | POA: Diagnosis not present

## 2023-07-06 DIAGNOSIS — Z23 Encounter for immunization: Secondary | ICD-10-CM | POA: Diagnosis not present

## 2023-07-06 DIAGNOSIS — B070 Plantar wart: Secondary | ICD-10-CM | POA: Insufficient documentation

## 2023-07-06 DIAGNOSIS — E1142 Type 2 diabetes mellitus with diabetic polyneuropathy: Secondary | ICD-10-CM | POA: Diagnosis not present

## 2023-07-06 DIAGNOSIS — Z7984 Long term (current) use of oral hypoglycemic drugs: Secondary | ICD-10-CM

## 2023-07-06 DIAGNOSIS — K589 Irritable bowel syndrome without diarrhea: Secondary | ICD-10-CM | POA: Diagnosis not present

## 2023-07-06 DIAGNOSIS — K582 Mixed irritable bowel syndrome: Secondary | ICD-10-CM

## 2023-07-06 LAB — GLUCOSE, CAPILLARY: Glucose-Capillary: 125 mg/dL — ABNORMAL HIGH (ref 70–99)

## 2023-07-06 LAB — POCT GLYCOSYLATED HEMOGLOBIN (HGB A1C): Hemoglobin A1C: 7.4 % — AB (ref 4.0–5.6)

## 2023-07-06 MED ORDER — LISINOPRIL 40 MG PO TABS: 40.0000 mg | ORAL_TABLET | Freq: Every day | ORAL | 3 refills | Status: DC

## 2023-07-06 MED ORDER — LINACLOTIDE 290 MCG PO CAPS: 290.0000 ug | ORAL_CAPSULE | Freq: Every day | ORAL | Status: DC

## 2023-07-06 MED ORDER — LISINOPRIL 20 MG PO TABS: 20.0000 mg | ORAL_TABLET | Freq: Every day | ORAL | 3 refills | Status: DC

## 2023-07-06 MED ORDER — LINACLOTIDE 290 MCG PO CAPS: 290.0000 ug | ORAL_CAPSULE | Freq: Every day | ORAL | 3 refills | Status: DC

## 2023-07-06 NOTE — Patient Instructions (Addendum)
Thank you, Sonya Lane for allowing [place] to provide your care today.  I have ordered the following tests for you:  Lab Orders         Microalbumin / Creatinine Urine Ratio         Glucose, capillary         Lipid Profile         BMP8+Anion Gap         POC Hbg A1C       Referrals ordered today:   Referral Orders         Ambulatory referral to Podiatry       Follow up: 3 months for Diabetes   Please remember:   Recommendations for weight loss and blood glucose control.   1) Cut out liquid sources of calories such as soda, juices, sports drinks, alcohol, sweet teas, and coffee with added sugars. These are "empty calories" meaning they bring your blood sugar up and add weight to your body without making you feel full.   2) Avoid food with added sugars. Some examples would be cakes, candies, and sauces (ie. ketchup and BBQ). You may be surprised how much sugar they sneak into processed foods.   3) Replace foods high in starches such as pasta, rice, potatoes, or breads with foods higher in protein such as meat.  Protein rich foods are more satiating - meaning they make you feel more full than carbohydrates. They also provide the body with important nutrients that we need. You can read nutritional labels to get more information regarding how much protein, carbohydrates, and fats foods contain.    We look forward to seeing you next time. Please call our clinic at [phone] if you have any questions or concerns. The best time to call is Monday-Friday from 9am-4pm, but there is someone available 24/7. If after hours or the weekend, call the main hospital number and ask for the Internal Medicine Resident On-Call. If you need medication refills, please notify your pharmacy one week in advance and they will send [place] a request.   Thank you for trusting me with your care. Wishing you the best!  Lovie Macadamia MD [facility]

## 2023-07-06 NOTE — Progress Notes (Signed)
Subjective:  CC: Follow-up on chronic conditions-diabetes, hypertension, hyperlipidemia  HPI:  Sonya Lane is a 73 y.o. person with a past medical history stated below and presents today for the stated chief complaint. Please see problem based assessment and plan for additional details.  Past Medical History:  Diagnosis Date   Chest pain 02/16/2019   DIABETES MELLITUS, TYPE II 03/17/2006        HYPERLIPIDEMIA 03/17/2006   Qualifier: Diagnosis of  By: Okey Dupre MD, [place]     HYPERTENSION 03/17/2006        Morbid obesity (HCC) 01/25/2008   Qualifier: Diagnosis of  By: Phifer MD, Harriett Sine     Statin intolerance 12/11/2015    Current Outpatient Medications on File Prior to Visit  Medication Sig Dispense Refill   amLODipine (NORVASC) 10 MG tablet Take 1 tablet (10 mg total) by mouth at bedtime. 90 tablet 3   carvedilol (COREG) 25 MG tablet Take 1 tablet (25 mg total) by mouth 2 (two) times daily. 180 tablet 3   Dulaglutide (TRULICITY) 4.5 MG/0.5ML SOPN Inject 4.5 mg as directed once a week.     gabapentin (NEURONTIN) 300 MG capsule Take 1 capsule (300 mg total) by mouth 3 (three) times daily. 270 capsule 3   glucose blood (ACCU-CHEK GUIDE) test strip USE TO CHECK BLOOD SUGAR ONCE  DAILY 365 strip 2   metFORMIN (GLUCOPHAGE) 1000 MG tablet Take 1 tablet (1,000 mg total) by mouth 2 (two) times daily with a meal. 180 tablet 3   Multiple Vitamins-Minerals (ONE-A-DAY 50 PLUS PO) Take 1 tablet by mouth in the morning.     No current facility-administered medications on file prior to visit.    Review of Systems: Please see assessment and plan for pertinent positives and negatives.  Objective:   Vitals:   07/06/23 1508 07/06/23 1557  BP: (!) 144/82 (!) 146/82  Pulse: 88 83  Temp: 97.8 F (36.6 C)   TempSrc: Oral   SpO2: 97%   Weight: 262 lb 3.2 oz (118.9 kg)   Height: 5\' 5"  (1.651 m)     Physical Exam: Constitutional: Well-appearing Cardiovascular: Regular rate and  rhythm Pulmonary/Chest: lungs clear to auscultation bilaterally Abdominal: soft, non-tender, non-distended Extremities: Trace edema of the lower extremities bilaterally.  Subcentimeter lesion of the plantar aspect of the right foot, most compatible with small plantar wart. Psych: Pleasant affect Thought process is linear and is goal-directed.     Assessment & Plan:  Essential hypertension Current regimen includes amlodipine 10 mg daily, carvedilol 25 mg twice daily, simple 20 mg daily.  Blood pressure today elevated to 144/82, states her blood pressures are about the same when she takes them at home.   Discussed that ideally would like to get her blood pressure lower, and was going to increase losartan to 40 mg.  Patient however is endorsing some orthostatic lightheadedness and dizziness, denies syncope.  She says this is worse after she exercises, and she must get up slowly in order to prevent lightheadedness.  Plan: Continue to monitor regarding patient's orthostatic lightheadedness Continue to follow blood pressures Will hold off on dose increasing lisinopril at this time.   IBS (irritable bowel syndrome) History of IBS, in the past got good relief with Linzess but states she has been out of this medication for sometimes.  She previous got this medication through the patient assistance program but has not received it recently. Plan: I will reach out to clinical pharmacist regarding Linzess prescription, take steps necessary to  try and get patient medication.   Type 2 diabetes mellitus with diabetic polyneuropathy (HCC) Last A1c on 12/16/2022 - 6.8.  A1c today 7.4, her metformin was switched from XR to generic metformin as she stated this worked better for her.  She does state however that her generic metformin not look like the previous bottle, and she was not taking it consistently for this reason.  Plan: Discussion regarding diet and exercise today Discussion regarding medication  adherence, assured patient that the metformin prescription is as requested at previous visit. Not able to take Jardiance, due to prior yeast infections. Repeat A1c in 3 months BMP to evaluate kidney function Microalbumin creatinine ratio Lipid profile   Plantar wart of right foot Patient endorsing lesion on plantar aspect of foot, most compatible with plantar wart.  No pain, bleeding from the site.  Discussed with patient to try not to touch it/pick at it.  Will refer to podiatry for evaluation and treatment given diabetes with peripheral neuropathy.   Patient discussed with Dr. Willow Ora MD [facility]  PGY-1 Pager: [phone]  Phone: [phone] Date 07/06/2023  Time 10:44 PM

## 2023-07-06 NOTE — Assessment & Plan Note (Signed)
Current regimen includes amlodipine 10 mg daily, carvedilol 25 mg twice daily, simple 20 mg daily.  Blood pressure today elevated to 144/82, states her blood pressures are about the same when she takes them at home.   Discussed that ideally would like to get her blood pressure lower, and was going to increase losartan to 40 mg.  Patient however is endorsing some orthostatic lightheadedness and dizziness, denies syncope.  She says this is worse after she exercises, and she must get up slowly in order to prevent lightheadedness.  Plan: Continue to monitor regarding patient's orthostatic lightheadedness Continue to follow blood pressures Will hold off on dose increasing lisinopril at this time.

## 2023-07-06 NOTE — Assessment & Plan Note (Signed)
History of IBS, in the past got good relief with Linzess but states she has been out of this medication for sometimes.  She previous got this medication through the patient assistance program but has not received it recently. Plan: I will reach out to clinical pharmacist regarding Linzess prescription, take steps necessary to try and get patient medication.

## 2023-07-06 NOTE — Assessment & Plan Note (Addendum)
Last A1c on 12/16/2022 - 6.8.  A1c today 7.4, her metformin was switched from XR to generic metformin as she stated this worked better for her.  She does state however that her generic metformin not look like the previous bottle, and she was not taking it consistently for this reason.  Plan: Discussion regarding diet and exercise today Discussion regarding medication adherence, assured patient that the metformin prescription is as requested at previous visit. Not able to take Jardiance, due to prior yeast infections. Repeat A1c in 3 months BMP to evaluate kidney function Microalbumin creatinine ratio Lipid profile

## 2023-07-06 NOTE — Assessment & Plan Note (Signed)
Patient endorsing lesion on plantar aspect of foot, most compatible with plantar wart.  No pain, bleeding from the site.  Discussed with patient to try not to touch it/pick at it.  Will refer to podiatry for evaluation and treatment given diabetes with peripheral neuropathy.

## 2023-07-07 ENCOUNTER — Ambulatory Visit: Payer: Medicare Other | Admitting: Podiatry

## 2023-07-07 DIAGNOSIS — M79674 Pain in right toe(s): Secondary | ICD-10-CM | POA: Diagnosis not present

## 2023-07-07 DIAGNOSIS — E1142 Type 2 diabetes mellitus with diabetic polyneuropathy: Secondary | ICD-10-CM

## 2023-07-07 DIAGNOSIS — B351 Tinea unguium: Secondary | ICD-10-CM | POA: Diagnosis not present

## 2023-07-07 DIAGNOSIS — M79675 Pain in left toe(s): Secondary | ICD-10-CM | POA: Diagnosis not present

## 2023-07-07 DIAGNOSIS — Q828 Other specified congenital malformations of skin: Secondary | ICD-10-CM | POA: Diagnosis not present

## 2023-07-07 HISTORY — DX: Tinea unguium: B35.1

## 2023-07-07 LAB — BMP8+ANION GAP
Anion Gap: 19 mmol/L — ABNORMAL HIGH (ref 10.0–18.0)
BUN/Creatinine Ratio: 13 (ref 12–28)
BUN: 10 mg/dL (ref 8–27)
CO2: 23 mmol/L (ref 20–29)
Calcium: 10.6 mg/dL — ABNORMAL HIGH (ref 8.7–10.3)
Chloride: 98 mmol/L (ref 96–106)
Creatinine, Ser: 0.8 mg/dL (ref 0.57–1.00)
Glucose: 117 mg/dL — ABNORMAL HIGH (ref 70–99)
Potassium: 3.8 mmol/L (ref 3.5–5.2)
Sodium: 140 mmol/L (ref 134–144)
eGFR: 77 mL/min/{1.73_m2} (ref >59)

## 2023-07-07 LAB — LIPID PANEL
Chol/HDL Ratio: 4.2 {ratio} (ref 0.0–4.4)
Cholesterol, Total: 242 mg/dL — ABNORMAL HIGH (ref 100–199)
HDL: 58 mg/dL (ref >39)
LDL Chol Calc (NIH): 156 mg/dL — ABNORMAL HIGH (ref 0–99)
Triglycerides: 155 mg/dL — ABNORMAL HIGH (ref 0–149)
VLDL Cholesterol Cal: 28 mg/dL (ref 5–40)

## 2023-07-07 LAB — MICROALBUMIN / CREATININE URINE RATIO
Creatinine, Urine: 86.3 mg/dL
Microalb/Creat Ratio: 401 mg/g{creat} — ABNORMAL HIGH (ref 0–29)
Microalbumin, Urine: 345.7 ug/mL

## 2023-07-07 NOTE — Telephone Encounter (Signed)
Followed up with patient regarding application. Left message.

## 2023-07-08 ENCOUNTER — Telehealth: Payer: Self-pay | Admitting: Student

## 2023-07-08 DIAGNOSIS — E782 Mixed hyperlipidemia: Secondary | ICD-10-CM

## 2023-07-08 MED ORDER — EZETIMIBE 10 MG PO TABS: 10.0000 mg | ORAL_TABLET | Freq: Every day | ORAL | 11 refills | Status: DC

## 2023-07-08 NOTE — Telephone Encounter (Signed)
 I spoke with Sonya Lane on the phone. Patient's identity was confirmed using two patient specific identifiers. We discussed her lab results.  Her calcium  continues to be elevated, has been elevated for 5+ years upon chart review.  Her microalbumin creatinine ratio is also elevated, we attempted to increase her lisinopril  last visit but she states she has orthostatic lightheadedness at times and we opted to be less aggressive about her blood pressure control.  Discussed better management for diabetes last visit, would not be a good candidate for Jardiance  due to recurrent yeast infections.  She is not taking a statin, states this is due to side effects in the past.  Discussed starting Zetia  with her-I will prescribe her this medication.  She should follow-up with us  in 3 months.

## 2023-07-08 NOTE — Progress Notes (Signed)
 This patient presents to the office with two concerns.  She was told by her medical doctor she had developed a wart on the right foot as well as needs nail care  for her diabetic neuropathy disease.  She says the nails are blackened and thick and painful to walk.  She says the skin lesion on the bottom of her right foot is presents but not painful and she has no memory of stepping on an object.  She was referred to this office for evaluation by her PCP.  General Appearance  Alert, conversant and in no acute stress.  Vascular  Dorsalis pedis and posterior tibial  pulses are palpable  bilaterally.  Capillary return is within normal limits  bilaterally. Temperature is within normal limits  bilaterally.  Neurologic  Senn-Weinstein monofilament wire test within normal limits  bilaterally. Muscle power within normal limits bilaterally.  Nails Thick disfigured discolored nails with subungual debris  from hallux to fifth toes bilaterally. No evidence of bacterial infection or drainage bilaterally.  Orthopedic  No limitations of motion  feet .  No crepitus or effusions noted.  No bony pathology or digital deformities noted.  Skin  normotropic skin  noted bilaterally.  No signs of infections or ulcers noted.   Small skin lesion with no redness or drainage noted .  Appears to be small developing porokeratosis proximal to met heads right foot.  Onychomycosis  Diabetes with neuropathy  Porokeratosis.  IE.  Debride nails with nail nipper and dremel tool.  Debride skin lesion with # 15 blade and dremel tool.  RTC prn.  Let the skin lesion declare itself.  Cordella Bold DPM

## 2023-07-23 NOTE — Progress Notes (Signed)
 Pharmacy Medication Assistance Program Note    08/26/2023  Patient ID: Sonya Lane, female  DOB: 1951/02/23, 73 y.o.  MRN:  [id number]     06/05/2023  Outreach Medication Two  Manufacturer Medication Two [place] Levels Drugs Linzess  Type of Assistance Manufacturer Assistance  Date Application Sent to Patient 06/16/2023  Application Items Requested Application;Proof of Income  Date Application Sent to Prescriber 07/29/2023  Name of Prescriber Miguel Aschoff  Date Application Received From Patient 07/21/2023  Application Items Received From Patient Application;Proof of Income  Date Application Received From Provider 08/26/2023  Method Application Sent to Manufacturer Fax  Date Application Submitted to Manufacturer 08/26/2023    Renewal Submitted

## 2023-07-23 NOTE — Progress Notes (Deleted)
 Pharmacy Medication Assistance Program Note    10/06/2023  Patient ID: Sonya Lane, female   DOB: 01/23/1951, 73 y.o.   MRN: [id number]     06/05/2023  Outreach Medication One  Manufacturer Medication One Retail buyer Drugs Trulicity   Dose of Trulicity  4.5MG   Type of Radiographer, therapeutic Assistance  Date Application Sent to Patient 06/16/2023  Application Items Requested Application  Name of Prescriber Cathey Clunes  Date Application Received From Patient 07/21/2023  Application Items Received From Patient Application;Proof of Income  Date Application Received From Provider 10/06/2023  Date Application Submitted to Manufacturer 10/06/2023  Method Application Sent to Manufacturer Fax    Faxed renewal.  Completed updated app with updated RX page. All faxed to company.

## 2023-08-11 ENCOUNTER — Telehealth: Payer: Self-pay | Admitting: *Deleted

## 2023-08-11 ENCOUNTER — Ambulatory Visit: Payer: Self-pay | Admitting: Internal Medicine

## 2023-08-11 DIAGNOSIS — K582 Mixed irritable bowel syndrome: Secondary | ICD-10-CM

## 2023-08-11 MED ORDER — TRULICITY 4.5 MG/0.5ML ~~LOC~~ SOAJ: 4.5000 mg | SUBCUTANEOUS | 3 refills | Status: AC

## 2023-08-11 NOTE — Telephone Encounter (Signed)
 Copied from CRM [id number]. Topic: Clinical - Medical Advice >> Aug 11, 2023 10:55 AM Maxwell Marion wrote: Reason for CRM: Patient is wanting a call back to discuss what she should do about her blood sugar, has been running higher since she has been without her trulicity. Has been running in the 180s.  Havent gotten medication  Trulicity last 3 weeks Dr. Erasmo Downer sent any information. Dr. Did not send in doctors information   Stars in eyes Ligtening strike through eye 5-6 minutes Yesterday evening  Dizziness on and off on Metormin once in a while  125 or less  Foundation,  fill out the patient information Missing information, Dr. Beulah Gandy [facility]     Chief Complaint: Elevated blood sugar Frequency: Past couple days Pertinent Negatives: Patient denies symptoms at this time Disposition: [] ED /[] Urgent Care (no appt availability in office) / [] Appointment(In office/virtual)/ []  [facility]/ [x] Home Care/ [] Refused Recommended Disposition /[] [facility]/ [x]  Follow-up with PCP Additional Notes: Patient stated that she has been having elevated blood sugars for the past couple days. She believes that it is elevated because she has not been taking Trulicity. Her blood sugar was 190 yesterday and 189 the day before. She does not check it daily. She has not checked her blood sugar today. She is not having any abnormal symptoms today. She is taking Metformin. She stated she is supposed to be using Trulicity but she has been without it for a few weeks.   She is trying to get the Trulicity from [facility]. She stated that the office sent in the paperwork for her to get the medication, but it was missing information and missing the provider's signature. Patient wants this issue to be addressed so she can get her Trulicity. Please advise.  Reason for Disposition  Blood glucose 70-240 mg/dL (3.9 -04.5 mmol/L)  Answer Assessment - Initial Assessment  Questions 1. BLOOD GLUCOSE: "What is your blood glucose level?"      190 yesterday, 189 day before   2. USUAL RANGE: "What is your glucose level usually?" (e.g., usual fasting morning value, usual evening value)     125 or less  3. DIABETES PILLS: "Do you take any pills for your diabetes?" If Yes, ask: "Have you missed taking any pills recently?"     Metformin  4. OTHER SYMPTOMS: "Do you have any symptoms?" (e.g., fever, frequent urination, difficulty breathing, dizziness, weakness, vomiting)     Patient stated she gets intermittent episodes of dizziness once in a while and this is not new. She stated that her provider told her this is a common side effect of Metformin. Patient mentioned that she had an episode of "seeing stars or a lightning strike through her eyes yesterday evening. It went away after 5-6 minutes. Patient denies having symptoms at this time  Protocols used: Diabetes - High Blood Sugar-A-AH

## 2023-08-11 NOTE — Telephone Encounter (Signed)
 Will forward message to Ocilla E to see what needs to be done.

## 2023-08-12 NOTE — Telephone Encounter (Signed)
 Patient received a letter from [employer] regarding missing RX. Informed patient that RX was sent 08/11/23 for her re-enrollment.

## 2023-08-12 NOTE — Telephone Encounter (Signed)
 Patient's forms for med application were returned for med assistance.  Needed further information .

## 2023-08-13 NOTE — Telephone Encounter (Signed)
 Called and talked with patient.  She reported that she talked with Durward Mallard yesterday and is expecting to get Trulicity in 1 to 2 weeks.  She is currently taking metformin at 1000 mg twice daily for her diabetes.  Her blood sugars have been in the 180s but she has been asymptomatic.  She also expressed concerns about getting Linzess.  This medication is also through's different pharmaceutical company.  I will message Durward Mallard to check in about status for this.  Patient breast frustration as she was not aware that she had to call the company to get refills.

## 2023-08-13 NOTE — Telephone Encounter (Signed)
 Refaxed patient pages to [employer] - rec'd missing patient info fax (all was sent first time).

## 2023-09-05 ENCOUNTER — Other Ambulatory Visit: Payer: Self-pay | Admitting: Student in an Organized Health Care Education/Training Program

## 2023-09-05 DIAGNOSIS — E1142 Type 2 diabetes mellitus with diabetic polyneuropathy: Secondary | ICD-10-CM

## 2023-09-14 NOTE — Telephone Encounter (Signed)
 Per rep, missing copy of insurance card.   Faxed to company 09/14/23.

## 2023-09-30 ENCOUNTER — Other Ambulatory Visit (HOSPITAL_COMMUNITY): Payer: Self-pay

## 2023-09-30 ENCOUNTER — Ambulatory Visit: Payer: Medicare Other

## 2023-09-30 VITALS — Wt 206.0 lb

## 2023-09-30 DIAGNOSIS — Z0000 Encounter for general adult medical examination without abnormal findings: Secondary | ICD-10-CM

## 2023-09-30 NOTE — Progress Notes (Signed)
 Because this visit was a virtual/telehealth visit,  certain criteria was not obtained, such a blood pressure, CBG if applicable, and timed get up and go. Any medications not marked as "taking" were not mentioned during the medication reconciliation part of the visit. Any vitals not documented were not able to be obtained due to this being a telehealth visit or patient was unable to self-report a recent blood pressure reading due to a lack of equipment at home via telehealth. Vitals that have been documented are verbally provided by the patient.   Subjective:   Sonya Lane is a 73 y.o. who presents for a Medicare Wellness preventive visit.  Visit Complete: Virtual I connected with  [name] on 09/30/23 by a audio enabled telemedicine application and verified that I am speaking with the correct person using two identifiers.  Patient Location: Home  Provider Location: Office/Clinic  I discussed the limitations of evaluation and management by telemedicine. The patient expressed understanding and agreed to proceed.  Vital Signs: Because this visit was a virtual/telehealth visit, some criteria may be missing or patient reported. Any vitals not documented were not able to be obtained and vitals that have been documented are patient reported.  VideoDeclined- This patient declined Librarian, academic. Therefore the visit was completed with audio only.  Persons Participating in Visit: Patient.  AWV Questionnaire: No: Patient Medicare AWV questionnaire was not completed prior to this visit.  Cardiac Risk Factors include: advanced age (>17men, >81 women);diabetes mellitus;family history of premature cardiovascular disease;hypertension;obesity (BMI >30kg/m2)     Objective:    Today's Vitals   09/30/23 1034  Weight: 206 lb (93.4 kg)  PainSc: 5   PainLoc: Knee   Body mass index is 34.28 kg/m.     09/30/2023   10:37 AM 12/16/2022   11:33 AM 07/02/2022     4:51 PM 03/20/2022    1:16 PM 12/02/2021   10:30 AM 07/25/2021   10:26 AM 04/23/2021    3:07 PM  Advanced Directives  Does Patient Have a Medical Advance Directive? Yes No No Yes Yes Yes No  Type of Estate agent of [place];Living will   Healthcare Power of [place];Living will Living will;Healthcare Power of Attorney Living will;Healthcare Power of Attorney   Does patient want to make changes to medical advance directive?    No - Patient declined No - Patient declined    Copy of Healthcare Power of Attorney in Chart? No - copy requested   No - copy requested No - copy requested    Would patient like information on creating a medical advance directive?  No - Patient declined No - Patient declined    No - Patient declined    Current Medications (verified) Outpatient Encounter Medications as of 09/30/2023  Medication Sig   amLODipine  (NORVASC ) 10 MG tablet Take 1 tablet (10 mg total) by mouth at bedtime.   carvedilol  (COREG ) 25 MG tablet Take 1 tablet (25 mg total) by mouth 2 (two) times daily.   Dulaglutide  (TRULICITY ) 4.5 MG/0.5ML SOAJ Inject 4.5 mg as directed once a week.   ezetimibe  (ZETIA ) 10 MG tablet Take 1 tablet (10 mg total) by mouth daily.   gabapentin  (NEURONTIN ) 300 MG capsule Take 1 capsule (300 mg total) by mouth 3 (three) times daily.   glucose blood (ACCU-CHEK GUIDE) test strip USE TO CHECK BLOOD SUGAR ONCE  DAILY   linaclotide  (LINZESS ) 290 MCG CAPS capsule Take 1 capsule (290 mcg total) by mouth daily  before breakfast.   lisinopril  (ZESTRIL ) 20 MG tablet Take 1 tablet (20 mg total) by mouth daily.   metFORMIN  (GLUCOPHAGE ) 1000 MG tablet TAKE 1 TABLET BY MOUTH TWICE  DAILY WITH A MEAL   Multiple Vitamins-Minerals (ONE-A-DAY 50 PLUS PO) Take 1 tablet by mouth in the morning.   No facility-administered encounter medications on file as of 09/30/2023.    Allergies (verified) Simvastatin   History: Past Medical History:  Diagnosis Date   Chest pain  02/16/2019   DIABETES MELLITUS, TYPE II 03/17/2006        HYPERLIPIDEMIA 03/17/2006   Qualifier: Diagnosis of  By: Nicolette Barrio MD, [place]     HYPERTENSION 03/17/2006        Morbid obesity (HCC) 01/25/2008   Qualifier: Diagnosis of  By: Phifer MD, Haskell Linker     Statin intolerance 12/11/2015   Past Surgical History:  Procedure Laterality Date   ABDOMINAL HYSTERECTOMY  1997   Family History  Problem Relation Age of Onset   Cancer Mother 100       Ovarian CA   Diabetes Mother    Cancer Father        Lung   Diabetes Sister    Arthritis Sister    Varicose Veins Sister    Diabetes Sister    Diabetes Brother    Diabetes Brother    Breast cancer Neg Hx    Social History   Socioeconomic History   Marital status: Widowed    Spouse name: Not on file   Number of children: Not on file   Years of education: 13   Highest education level: Not on file  Occupational History   Occupation: Retired    Comment: Engineer, civil (consulting) Aide   Occupation: Disabled  Tobacco Use   Smoking status: Never   Smokeless tobacco: Never  Vaping Use   Vaping status: Never Used  Substance and Sexual Activity   Alcohol use: No   Drug use: No   Sexual activity: Not Currently  Other Topics Concern   Not on file  Social History Narrative   Current Social History 03/04/2019        Patient lives alone in a second floor apartment. There are 18 steps with handrails up to the entrance the patient uses.       Patient's method of transportation is personal car.      The highest level of education was some college.      The patient currently disabled.      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[place]atte, BSN, RN-BC       Social Drivers of Health   Financial Resource Strain: Low Risk  (09/30/2023)   Overall Financial Resource Strain (CARDIA)    Difficulty of Paying Living Expenses: Not hard at all  Food Insecurity: No Food Insecurity (09/30/2023)   Hunger Vital Sign    Worried About Running Out of Food in the Last Year: Never true    Ran Out of Food in the Last Year: Never true  Transportation Needs: No Transportation Needs (09/30/2023)  PRAPARE - Administrator, Civil Service (Medical): No    Lack of Transportation (Non-Medical): No  Physical Activity: Insufficiently Active (09/30/2023)   Exercise Vital Sign    Days of Exercise per Week: 3 days    Minutes of Exercise per Session: 40 min  Stress: No Stress Concern Present (09/30/2023)   [employer] of Occupational Health - Occupational Stress Questionnaire    Feeling of Stress : Not at all  Social Connections: Moderately Integrated (09/30/2023)   Social Connection and Isolation Panel [NHANES]    Frequency of Communication with Friends and Family: More than three times a week    Frequency of Social Gatherings with Friends and Family: More than three times a week    Attends Religious Services: More than 4 times per year    Active Member of [employer] or Organizations: Yes    Attends Banker Meetings: 1 to 4 times per year    Marital Status: Widowed    Tobacco Counseling Counseling given: Not Answered    Clinical Intake:  Pre-visit preparation completed: Yes  Pain : 0-10 Pain Score: 5  Pain Type: Chronic pain Pain Location: Knee Pain Orientation: Left, Right Pain Descriptors / Indicators: Aching, Dull (when standing)     BMI - recorded: 34.28 Nutritional Status: BMI > 30  Obese Nutritional Risks: None Diabetes: Yes CBG done?: No Did pt. bring in CBG monitor from home?: No  Lab Results  Component Value Date   HGBA1C 7.4 (A) 07/06/2023   HGBA1C 6.8 (A) 12/16/2022   HGBA1C 7.0 (A)  09/29/2022     How often do you need to have someone help you when you read instructions, pamphlets, or other written materials from your doctor or pharmacy?: 1 - Never  Interpreter Needed?: No  Information entered by :: Druscilla Gerhard, LPN.   Activities of Daily Living     09/30/2023   10:54 AM  In your present state of health, do you have any difficulty performing the following activities:  Hearing? 0  Vision? 0  Difficulty concentrating or making decisions? 0  Walking or climbing stairs? 0  Dressing or bathing? 0  Doing errands, shopping? 0  Preparing Food and eating ? N  Using the Toilet? N  In the past six months, have you accidently leaked urine? N  Do you have problems with loss of bowel control? N  Managing your Medications? N  Managing your Finances? N  Housekeeping or managing your Housekeeping? N    Patient Care Team: Karalee Oscar, DO as PCP - General (Internal Medicine) Albert Huff, MD as Consulting Physician (Ophthalmology) [facility] as Consulting Physician (Optometry)  Indicate any recent Medical Services you may have received from other than [facility] providers in the past year (date may be approximate).     Assessment:   This is a routine wellness examination for Sonya Lane.  Hearing/Vision screen Hearing Screening - Comments:: Denies hearing difficulties. No hearing aids.   Vision Screening - Comments:: Wears rx glasses - up to date with routine eye exams with [facility]   Goals Addressed             This Visit's Progress    Patient Stated       09/30/2023: "Continue to live and stay independent."       Depression Screen     09/30/2023   10:40 AM 07/02/2022    5:01 PM 07/01/2022   10:52 AM  06/13/2022   10:28 AM 12/02/2021   11:01 AM 07/25/2021   10:25 AM 10/31/2020   11:47 AM  PHQ 2/9 Scores  PHQ - 2 Score 0 0 0 0 0 0 0  PHQ- 9 Score 0 6 6   0     Fall Risk     09/30/2023   10:37 AM  09/29/2022    9:49 AM 07/01/2022   10:50 AM 03/20/2022    1:16 PM 12/02/2021   10:29 AM  Fall Risk   Falls in the past year? 1 0 1 0 0  Number falls in past yr: 0 0 1 0 0  Comment   BENDING OVER AND FELL    Injury with Fall? 0 0 0 0 0  Risk for fall due to : No Fall Risks No Fall Risks No Fall Risks    Follow up Falls evaluation completed;Falls prevention discussed Falls evaluation completed  Falls evaluation completed Falls evaluation completed    MEDICARE RISK AT HOME:  Medicare Risk at Home Any stairs in or around the home?: Yes (18 STEPS TO APARTMENT) If so, are there any without handrails?: No Home free of loose throw rugs in walkways, pet beds, electrical cords, etc?: Yes Adequate lighting in your home to reduce risk of falls?: Yes Life alert?: No Use of a cane, Mcbroom or w/c?: Yes Grab bars in the bathroom?: No Shower chair or bench in shower?: No Elevated toilet seat or a handicapped toilet?: No  TIMED UP AND GO:  Was the test performed?  No  Cognitive Function: 6CIT completed    09/30/2023   10:40 AM  MMSE - Mini Mental State Exam  Not completed: Unable to complete        09/30/2023   10:49 AM 07/02/2022    5:03 PM  6CIT Screen  What Year? 0 points 0 points  What month? 0 points 0 points  What time? 0 points 0 points  Count back from 20 0 points 2 points  Months in reverse 0 points 4 points  Repeat phrase 0 points 0 points  Total Score 0 points 6 points    Immunizations Immunization History  Administered Date(s) Administered   Fluad Quad(high Dose 65+) 07/19/2020, 04/23/2021, 06/13/2022   Fluad Trivalent(High Dose 65+) 07/06/2023   H1N1 07/05/2008   Influenza Split 02/20/2011   Influenza Whole 03/16/2007, 03/14/2008, 02/11/2010   Influenza, High Dose Seasonal PF 06/07/2018   Influenza,inj,Quad PF,6+ Mos 02/04/2013, 02/09/2014, 04/13/2015, 02/25/2017, 02/14/2019   Moderna Sars-Covid-2 Vaccination 10/04/2019, 11/01/2019   PNEUMOCOCCAL CONJUGATE-20  06/13/2022   Pneumococcal Conjugate-13 04/13/2015   Pneumococcal Polysaccharide-23 03/14/2008, 06/10/2013   Td 07/25/2021   Tdap 02/20/2011    Screening Tests Health Maintenance  Topic Date Due   Zoster Vaccines- Shingrix (1 of 2) Never done   COVID-19 Vaccine (3 - 2024-25 season) 02/01/2023   FOOT EXAM  09/29/2023   OPHTHALMOLOGY EXAM  09/29/2023   HEMOGLOBIN A1C  10/03/2023   INFLUENZA VACCINE  01/01/2024   Diabetic kidney evaluation - eGFR measurement  07/05/2024   Diabetic kidney evaluation - Urine ACR  07/05/2024   LIPID PANEL  07/05/2024   Medicare Annual Wellness (AWV)  09/29/2024   MAMMOGRAM  02/12/2025   Colonoscopy  04/24/2027   DTaP/Tdap/Td (3 - Td or Tdap) 07/26/2031   Pneumonia Vaccine 22+ Years old  Completed   DEXA SCAN  Completed   Hepatitis C Screening  Completed   HPV VACCINES  Aged Out   Meningococcal B Vaccine  Aged  Out    Health Maintenance  Health Maintenance Due  Topic Date Due   Zoster Vaccines- Shingrix (1 of 2) Never done   COVID-19 Vaccine (3 - 2024-25 season) 02/01/2023   FOOT EXAM  09/29/2023   OPHTHALMOLOGY EXAM  09/29/2023   Health Maintenance Items Addressed: Yes Patient made aware of current care gaps.  Additional Screening:  Vision Screening: Recommended annual ophthalmology exams for early detection of glaucoma and other disorders of the eye.  Dental Screening: Recommended annual dental exams for proper oral hygiene  Community Resource Referral / Chronic Care Management: CRR required this visit?  No   CCM required this visit?  No     Plan:     I have personally reviewed and noted the following in the patient's chart:   Medical and social history Use of alcohol, tobacco or illicit drugs  Current medications and supplements including opioid prescriptions. Patient is not currently taking opioid prescriptions. Functional ability and status Nutritional status Physical activity Advanced directives List of other  physicians Hospitalizations, surgeries, and ER visits in previous 12 months Vitals Screenings to include cognitive, depression, and falls Referrals and appointments  In addition, I have reviewed and discussed with patient certain preventive protocols, quality metrics, and best practice recommendations. A written personalized care plan for preventive services as well as general preventive health recommendations were provided to patient.     Margette Sheldon, LPN   1/61/0960   After Visit Summary: (MyChart) Due to this being a telephonic visit, the after visit summary with patients personalized plan was offered to patient via MyChart   Notes: Please refer to Routing Comments.

## 2023-09-30 NOTE — Telephone Encounter (Addendum)
 Reached out to [employer] to f/u on application.  Whole app will need to be resubmitted due to older version being more than 71 days old.   Provider pg 8 was never rec'd by company.   Will need to resend whole app to [employer].   App in yellow teams box awaiting signature.

## 2023-09-30 NOTE — Progress Notes (Signed)
 This visit was performed by a medical professional under my direct supervision. I was immediately available for consultation/collaboration. I have reviewed and agree with the Annual Wellness Visit documentation.

## 2023-09-30 NOTE — Progress Notes (Signed)
 Internal Medicine Clinic Attending  Case and documentation of Dr. Garald Jumbo reviewed.  I reviewed the AWV findings.

## 2023-09-30 NOTE — Patient Instructions (Addendum)
 Sonya Lane , Thank you for taking time to come for your Medicare Wellness Visit. I appreciate your ongoing commitment to your health goals. Please review the following plan we discussed and let me know if I can assist you in the future.   Referrals/Orders/Follow-Ups/Clinician Recommendations: Keep maintaining your health by keeping your appointments with Dr. Karalee Oscar and any specialists that you may see.  Call us  if you need anything.  Have a great year!!!!  Recommendations from Nurse Zeus Marquis: Aim for 30 minutes of exercise or brisk walking 5 days per week.  Drink 6-8 glasses of water each day. Eat 5-6 servings of fresh vegetables and fruits per day. Continue to do brain exercises such as reading, puzzles, games on your phone to help keep the brain sharp and active.  This is a list of the screening recommended for you and due dates:  Health Maintenance  Topic Date Due   Zoster (Shingles) Vaccine (1 of 2) Never done   COVID-19 Vaccine (3 - 2024-25 season) 02/01/2023   Complete foot exam   09/29/2023   Eye exam for diabetics  09/29/2023   Hemoglobin A1C  10/03/2023   Flu Shot  01/01/2024   Yearly kidney function blood test for diabetes  07/05/2024   Yearly kidney health urinalysis for diabetes  07/05/2024   Lipid (cholesterol) test  07/05/2024   Medicare Annual Wellness Visit  09/29/2024   Mammogram  02/12/2025   Colon Cancer Screening  04/24/2027   DTaP/Tdap/Td vaccine (3 - Td or Tdap) 07/26/2031   Pneumonia Vaccine  Completed   DEXA scan (bone density measurement)  Completed   Hepatitis C Screening  Completed   HPV Vaccine  Aged Out   Meningitis B Vaccine  Aged Out    Advanced directives: (Copy Requested) Please bring a copy of your health care power of attorney and living will to the office to be added to your chart at your convenience. You can mail to [facility] [address] [place], [place] [address] or email to [email or web address]  Next Medicare  Annual Wellness Visit scheduled for next year: Yes, It was nice speaking with you today! Your next Annual Wellness Visit is scheduled for 10/05/2024 at 10:30 a.m. via PHONE VISIT. If you need to reschedule or cancel, please call [phone]. (IMP)

## 2023-10-01 DIAGNOSIS — H16223 Keratoconjunctivitis sicca, not specified as Sjogren's, bilateral: Secondary | ICD-10-CM | POA: Diagnosis not present

## 2023-10-01 DIAGNOSIS — E113313 Type 2 diabetes mellitus with moderate nonproliferative diabetic retinopathy with macular edema, bilateral: Secondary | ICD-10-CM | POA: Diagnosis not present

## 2023-10-01 DIAGNOSIS — H524 Presbyopia: Secondary | ICD-10-CM | POA: Diagnosis not present

## 2023-10-01 DIAGNOSIS — H25813 Combined forms of age-related cataract, bilateral: Secondary | ICD-10-CM | POA: Diagnosis not present

## 2023-10-01 DIAGNOSIS — H52223 Regular astigmatism, bilateral: Secondary | ICD-10-CM | POA: Diagnosis not present

## 2023-10-05 ENCOUNTER — Ambulatory Visit (INDEPENDENT_AMBULATORY_CARE_PROVIDER_SITE_OTHER): Admitting: Internal Medicine

## 2023-10-05 ENCOUNTER — Encounter: Payer: Self-pay | Admitting: Internal Medicine

## 2023-10-05 VITALS — BP 188/90 | HR 72 | Temp 97.6°F | Ht 65.0 in | Wt 253.1 lb

## 2023-10-05 DIAGNOSIS — I10 Essential (primary) hypertension: Secondary | ICD-10-CM | POA: Diagnosis not present

## 2023-10-05 DIAGNOSIS — E785 Hyperlipidemia, unspecified: Secondary | ICD-10-CM

## 2023-10-05 DIAGNOSIS — K582 Mixed irritable bowel syndrome: Secondary | ICD-10-CM

## 2023-10-05 DIAGNOSIS — Z7984 Long term (current) use of oral hypoglycemic drugs: Secondary | ICD-10-CM

## 2023-10-05 DIAGNOSIS — E1142 Type 2 diabetes mellitus with diabetic polyneuropathy: Secondary | ICD-10-CM | POA: Diagnosis not present

## 2023-10-05 DIAGNOSIS — Z0000 Encounter for general adult medical examination without abnormal findings: Secondary | ICD-10-CM

## 2023-10-05 DIAGNOSIS — E782 Mixed hyperlipidemia: Secondary | ICD-10-CM

## 2023-10-05 LAB — POCT GLYCOSYLATED HEMOGLOBIN (HGB A1C): Hemoglobin A1C: 8.3 % — AB (ref 4.0–5.6)

## 2023-10-05 LAB — GLUCOSE, CAPILLARY: Glucose-Capillary: 191 mg/dL — ABNORMAL HIGH (ref 70–99)

## 2023-10-05 MED ORDER — AMLODIPINE BESYLATE 10 MG PO TABS: 10.0000 mg | ORAL_TABLET | Freq: Every evening | ORAL | 3 refills | Status: DC

## 2023-10-05 MED ORDER — LISINOPRIL 20 MG PO TABS: 20.0000 mg | ORAL_TABLET | Freq: Every day | ORAL | 3 refills | Status: DC

## 2023-10-05 MED ORDER — LINACLOTIDE 290 MCG PO CAPS
290.0000 ug | ORAL_CAPSULE | Freq: Every day | ORAL | Status: AC
Start: 2023-10-05 — End: ?

## 2023-10-05 MED ORDER — ROSUVASTATIN CALCIUM 10 MG PO TABS: 10.0000 mg | ORAL_TABLET | Freq: Every day | ORAL | 11 refills | Status: DC

## 2023-10-05 NOTE — Patient Instructions (Signed)
 [name], it was a pleasure seeing you today! You endorsed feeling well today. Below are some of the things we talked about this visit. We look forward to seeing you in the follow up appointment!  Today we discussed: Please take all 3 of your blood pressure medications.   We will restart your crestor   at 10 mg every day.   I am going to work to see if we can get trulicity  or another medicine that is affordable to you.   I have ordered the following labs today:   Lab Orders         Glucose, capillary         POC Hbg A1C       Referrals ordered today:   Referral Orders  No referral(s) requested today     I have ordered the following medication/changed the following medications:   Stop the following medications: Medications Discontinued During This Encounter  Medication Reason   linaclotide  (LINZESS ) 290 MCG CAPS capsule Reorder   amLODipine  (NORVASC ) 10 MG tablet Reorder   lisinopril  (ZESTRIL ) 20 MG tablet Reorder     Start the following medications: Meds ordered this encounter  Medications   linaclotide  (LINZESS ) 290 MCG CAPS capsule    Sig: Take 1 capsule (290 mcg total) by mouth daily before breakfast.   amLODipine  (NORVASC ) 10 MG tablet    Sig: Take 1 tablet (10 mg total) by mouth at bedtime.    Dispense:  90 tablet    Refill:  3    Requesting 1 year supply   lisinopril  (ZESTRIL ) 20 MG tablet    Sig: Take 1 tablet (20 mg total) by mouth daily.    Dispense:  90 tablet    Refill:  3     Follow-up: 1 month follow up   Please make sure to arrive 15 minutes prior to your next appointment. If you arrive late, you may be asked to reschedule.   We look forward to seeing you next time. Please call our clinic at [phone] if you have any questions or concerns. The best time to call is Monday-Friday from 9am-4pm, but there is someone available 24/7. If after hours or the weekend, call the main hospital number and ask for the Internal Medicine Resident  On-Call. If you need medication refills, please notify your pharmacy one week in advance and they will send us  a request.  Thank you for letting us  take part in your care. Wishing you the best!  Thank you, Jackolyn Masker, MD

## 2023-10-05 NOTE — Progress Notes (Unsigned)
 CC: 3 month follow up  HPI:  Sonya Lane is a 73 y.o. with medical history of HTN, HLD, DMII and class III obesity presenting to [facility] for a 3 month follow up. Last seen by Dr. Ezekiel Hollingshead for 3 month follow on 07/2023.  Please see problem-based list for further details, assessments, and plans.  Past Medical History:  Diagnosis Date   Adverse effect of antihyperlipidemic and antiarteriosclerotic drugs, initial encounter 11/28/2019   Chest pain 02/16/2019   DIABETES MELLITUS, TYPE II 03/17/2006        HYPERLIPIDEMIA 03/17/2006   Qualifier: Diagnosis of  By: Nicolette Barrio MD, [place]     HYPERTENSION 03/17/2006        Morbid obesity (HCC) 01/25/2008   Qualifier: Diagnosis of  By: Phifer MD, Haskell Linker     Non-alcoholic fatty liver disease 01/25/2008   Pain due to onychomycosis of toenails of both feet 07/07/2023   Primary focal hyperhidrosis 04/23/2021   Statin intolerance 12/11/2015    Current Outpatient Medications (Endocrine & Metabolic):    Dulaglutide  (TRULICITY ) 4.5 MG/0.5ML SOAJ, Inject 4.5 mg as directed once a week.   metFORMIN  (GLUCOPHAGE ) 1000 MG tablet, TAKE 1 TABLET BY MOUTH TWICE  DAILY WITH A MEAL  Current Outpatient Medications (Cardiovascular):    rosuvastatin  (CRESTOR ) 10 MG tablet, Take 1 tablet (10 mg total) by mouth daily.   amLODipine  (NORVASC ) 10 MG tablet, Take 1 tablet (10 mg total) by mouth at bedtime.   carvedilol  (COREG ) 25 MG tablet, Take 1 tablet (25 mg total) by mouth 2 (two) times daily.   ezetimibe  (ZETIA ) 10 MG tablet, Take 1 tablet (10 mg total) by mouth daily.   lisinopril  (ZESTRIL ) 20 MG tablet, Take 1 tablet (20 mg total) by mouth daily.     Current Outpatient Medications (Other):    gabapentin  (NEURONTIN ) 300 MG capsule, Take 1 capsule (300 mg total) by mouth 3 (three) times daily.   glucose blood (ACCU-CHEK GUIDE) test strip, USE TO CHECK BLOOD SUGAR ONCE  DAILY   linaclotide  (LINZESS ) 290 MCG CAPS capsule, Take 1 capsule (290 mcg total) by  mouth daily before breakfast.   Multiple Vitamins-Minerals (ONE-A-DAY 50 PLUS PO), Take 1 tablet by mouth in the morning.  Review of Systems:  Review of system negative unless stated in the problem list or HPI.    Physical Exam:  Vitals:   10/05/23 1356 10/05/23 1447  BP: (!) 168/85 (!) 188/90  Pulse: 77 72  Temp: 97.6 F (36.4 C)   TempSrc: Oral   Weight: 253 lb 1.6 oz (114.8 kg)   Height: 5\' 5"  (1.651 m)    Physical Exam General: NAD HENT: NCAT Lungs: CTAB, no wheeze, rhonchi or rales.  Cardiovascular: Normal heart sounds, no r/m/g, 2+ pulses in all extremities. No LE edema Abdomen: No TTP, normal bowel sounds MSK: No asymmetry or muscle atrophy.  Skin: no lesions noted on exposed skin Neuro: Alert and oriented x4. CN grossly intact Psych: Normal mood and normal affect   Assessment & Plan:   Essential hypertension Pt with HTN who is on amlodipine  10 mg every day, lisinopril  20 mg every day, Coreg  25 mg BID. Blood pressure was elevated but it appears pt is not taking her lisinopril  as it was last dispensed in 12/2022. Advised strict adherence to her regimen. Advised her to monitor her blood pressure at home. She is to call us  if she has continued elevated readings despite taking all three medications. Will have pt come back in 4 weeks for  repeat BMP and HTN follow up for titration of her medications.   Type 2 diabetes mellitus with diabetic polyneuropathy (HCC) Patient with diabetes that is uncontrolled who is taking metformin  1000 mg twice daily.  She was on Trulicity  4.5 mg weekly but has been unable to get this medication for 6 months now.  Her A1c was improved and she was on the Trulicity  and has worsened to 8.3 currently.  Will work on trying to get GLP-1 approved for her as she will benefit from that.    Healthcare maintenance Foot exam done this visit.  Eye exam completed by patient in 09/2023.  Hyperlipidemia Pt with HLD who is also diabetic and at high risk for  cardiovascular event given she has declined statins due to intolerability.On chart review, it appears patient was on Crestor  for around 2 years and tolerated it well.  When I mention this medication to her she stated she believes it will lead to alopecia.  I advised her this is not a common side effect for Crestor  and she should try it again.  She is in agreement to try that lower dose and if he is able to tolerated increase to higher dose.  Will start Crestor  at 10 mg daily.   See Encounters Tab for problem based charting.  Patient Discussed with Dr. Laurin Popp, MD Tommas Fragmin. [facility] Internal Medicine Residency, PGY-3

## 2023-10-06 NOTE — Assessment & Plan Note (Signed)
 Pt with HTN who is on amlodipine  10 mg every day, lisinopril  20 mg every day, Coreg  25 mg BID. Blood pressure was elevated but it appears pt is not taking her lisinopril  as it was last dispensed in 12/2022. Advised strict adherence to her regimen. Advised her to monitor her blood pressure at home. She is to call us  if she has continued elevated readings despite taking all three medications. Will have pt come back in 4 weeks for repeat BMP and HTN follow up for titration of her medications.

## 2023-10-06 NOTE — Assessment & Plan Note (Addendum)
 Patient with diabetes that is uncontrolled who is taking metformin  1000 mg twice daily.  She was on Trulicity  4.5 mg weekly but has been unable to get this medication for 6 months now.  Her A1c was improved and she was on the Trulicity  and has worsened to 8.3 currently.  Will work on trying to get GLP-1 approved for her as she will benefit from that.

## 2023-10-06 NOTE — Assessment & Plan Note (Signed)
 Foot exam done this visit.  Eye exam completed by patient in 09/2023.

## 2023-10-06 NOTE — Progress Notes (Signed)
 Pharmacy Medication Assistance Program Note    10/08/2023  Patient ID: Sonya Lane, female   DOB: [birth year], 73 y.o.   MRN: [id number]     06/05/2023  Outreach Medication One  Manufacturer Medication One [employer] Drugs Trulicity   Dose of Trulicity  4.5MG   Type of Radiographer, therapeutic Assistance  Date Application Sent to Patient 06/16/2023  Application Items Requested Application  Name of Prescriber Cathey Clunes  Date Application Received From Patient 07/21/2023  Application Items Received From Patient Application;Proof of Income  Date Application Received From Provider 10/06/2023  Date Application Submitted to Manufacturer 10/06/2023  Method Application Sent to Manufacturer Fax  Patient Assistance Determination Approved  Approval Start Date 10/07/2023  Approval End Date 06/01/2024     Approved for 2025

## 2023-10-06 NOTE — Assessment & Plan Note (Signed)
 Pt with HLD who is also diabetic and at high risk for cardiovascular event given she has declined statins due to intolerability.On chart review, it appears patient was on Crestor  for around 2 years and tolerated it well.  When I mention this medication to her she stated she believes it will lead to alopecia.  I advised her this is not a common side effect for Crestor  and she should try it again.  She is in agreement to try that lower dose and if he is able to tolerated increase to higher dose.  Will start Crestor  at 10 mg daily.

## 2023-10-07 ENCOUNTER — Other Ambulatory Visit: Payer: Self-pay

## 2023-10-07 DIAGNOSIS — G59 Mononeuropathy in diseases classified elsewhere: Secondary | ICD-10-CM

## 2023-10-07 DIAGNOSIS — E1142 Type 2 diabetes mellitus with diabetic polyneuropathy: Secondary | ICD-10-CM

## 2023-10-07 MED ORDER — GABAPENTIN 300 MG PO CAPS: 300.0000 mg | ORAL_CAPSULE | Freq: Three times a day (TID) | ORAL | 3 refills | Status: DC

## 2023-10-08 NOTE — Progress Notes (Signed)
 Pharmacy Medication Assistance Program Note    10/08/2023  Patient ID: Sonya Lane, female  DOB: June 24, 1950, 73 y.o.  MRN:  [id number]     06/05/2023  Outreach Medication Two  Manufacturer Medication Two [employer]  [employer] Linzess   Type of Assistance Manufacturer Assistance  Date Application Sent to Patient 06/16/2023  Application Items Requested Application;Proof of Income  Date Application Sent to Prescriber 07/29/2023  Name of Prescriber Sherol Dixie  Date Application Received From Patient 07/21/2023  Application Items Received From Patient Application;Proof of Income  Date Application Received From Provider 08/26/2023  Method Application Sent to Manufacturer Fax  Date Application Submitted to Manufacturer 08/26/2023  Patient Assistance Determination Approved  Approval Start Date 10/08/2023       RENEWAL APPROVED FOR 2025

## 2023-10-12 NOTE — Progress Notes (Signed)
 Internal Medicine Clinic Attending  Case discussed with the resident at the time of the visit.  We reviewed the resident's history and exam and pertinent patient test results.  I agree with the assessment, diagnosis, and plan of care documented in the resident's note.

## 2023-12-07 ENCOUNTER — Telehealth: Payer: Self-pay | Admitting: Pharmacist

## 2023-12-07 NOTE — Progress Notes (Signed)
 Patient was identified as falling into the [place] Measure - Diabetes.   Patient was: Attribution and/or data issue.  Validation/Investigation needed.  Explanation:  Attributed to wrong clinic. Patient follows with Internal Medicine Clinic. Will have our pharmacist outreach later this month.

## 2024-01-06 ENCOUNTER — Ambulatory Visit: Admitting: Student

## 2024-01-06 VITALS — BP 168/95 | HR 96 | Temp 98.1°F | Ht 65.0 in | Wt 252.4 lb

## 2024-01-06 DIAGNOSIS — Z7985 Long-term (current) use of injectable non-insulin antidiabetic drugs: Secondary | ICD-10-CM | POA: Diagnosis not present

## 2024-01-06 DIAGNOSIS — E1142 Type 2 diabetes mellitus with diabetic polyneuropathy: Secondary | ICD-10-CM | POA: Diagnosis not present

## 2024-01-06 DIAGNOSIS — Z7984 Long term (current) use of oral hypoglycemic drugs: Secondary | ICD-10-CM

## 2024-01-06 DIAGNOSIS — E785 Hyperlipidemia, unspecified: Secondary | ICD-10-CM | POA: Diagnosis not present

## 2024-01-06 DIAGNOSIS — I10 Essential (primary) hypertension: Secondary | ICD-10-CM | POA: Diagnosis not present

## 2024-01-06 DIAGNOSIS — E782 Mixed hyperlipidemia: Secondary | ICD-10-CM

## 2024-01-06 LAB — GLUCOSE, CAPILLARY: Glucose-Capillary: 138 mg/dL — ABNORMAL HIGH (ref 70–99)

## 2024-01-06 LAB — POCT GLYCOSYLATED HEMOGLOBIN (HGB A1C): Hemoglobin A1C: 7 % — AB (ref 4.0–5.6)

## 2024-01-06 MED ORDER — AMLODIPINE-OLMESARTAN 10-20 MG PO TABS
1.0000 | ORAL_TABLET | Freq: Every day | ORAL | 1 refills | Status: DC
Start: 2024-01-06 — End: 2024-02-16

## 2024-01-06 NOTE — Progress Notes (Signed)
 CC: Follow-up  HPI:  Sonya Lane is a 73 y.o. female living with a history stated below and presents today for follow-up. Please see problem based assessment and plan for additional details.  Past Medical History:  Diagnosis Date   Adverse effect of antihyperlipidemic and antiarteriosclerotic drugs, initial encounter 11/28/2019   Chest pain 02/16/2019   DIABETES MELLITUS, TYPE II 03/17/2006        HYPERLIPIDEMIA 03/17/2006   Qualifier: Diagnosis of  By: Sonya Lane     HYPERTENSION 03/17/2006        Morbid obesity (HCC) 01/25/2008   Qualifier: Diagnosis of  By: Sonya Lane     Non-alcoholic fatty liver disease 01/25/2008   Pain due to onychomycosis of toenails of both feet 07/07/2023   Primary focal hyperhidrosis 04/23/2021   Statin intolerance 12/11/2015    Current Outpatient Medications on File Prior to Visit  Medication Sig Dispense Refill   carvedilol  (COREG ) 25 MG tablet Take 1 tablet (25 mg total) by mouth 2 (two) times daily. 180 tablet 3   Dulaglutide  (TRULICITY ) 4.5 MG/0.5ML SOAJ Inject 4.5 mg as directed once a week. 6 mL 3   ezetimibe  (ZETIA ) 10 MG tablet Take 1 tablet (10 mg total) by mouth daily. 30 tablet 11   gabapentin  (NEURONTIN ) 300 MG capsule Take 1 capsule (300 mg total) by mouth 3 (three) times daily. 270 capsule 3   glucose blood (ACCU-CHEK GUIDE) test strip USE TO CHECK BLOOD SUGAR ONCE  DAILY 365 strip 2   linaclotide  (LINZESS ) 290 MCG CAPS capsule Take 1 capsule (290 mcg total) by mouth daily before breakfast.     metFORMIN  (GLUCOPHAGE ) 1000 MG tablet TAKE 1 TABLET BY MOUTH TWICE  DAILY WITH A MEAL 200 tablet 2   Multiple Vitamins-Minerals (ONE-A-DAY 50 PLUS PO) Take 1 tablet by mouth in the morning.     rosuvastatin  (CRESTOR ) 10 MG tablet Take 1 tablet (10 mg total) by mouth daily. 30 tablet 11   No current facility-administered medications on file prior to visit.    Family History  Problem Relation Age of Onset   Cancer  Mother 43       Ovarian CA   Diabetes Mother    Cancer Father        Lung   Diabetes Sister    Arthritis Sister    Varicose Veins Sister    Diabetes Sister    Diabetes Brother    Diabetes Brother    Breast cancer Neg Hx     Social History   Socioeconomic History   Marital status: Widowed    Spouse name: Not on file   Number of children: Not on file   Years of education: 13   Highest education level: Not on file  Occupational History   Occupation: Retired    Comment: Engineer, civil (consulting) Aide   Occupation: Disabled  Tobacco Use   Smoking status: Never   Smokeless tobacco: Never  Vaping Use   Vaping status: Never Used  Substance and Sexual Activity   Alcohol use: No   Drug use: No   Sexual activity: Not Currently  Other Topics Concern   Not on file  Social History Narrative   Current Social History 03/04/2019        Patient lives alone in a second floor apartment. There are 18 steps with handrails up to the entrance the patient uses.       Patient's method of transportation is personal car.      The  highest level of education was some college.      The patient currently disabled.      Identified important Relationships are My daughter.       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 (Before Covid)       Current Stressors: I don't have any stress.       Religious / Personal Beliefs: I believe in God, I believe in [place] and the [address].       Sonya Lane, BSN, RN-BC       Social Drivers of Health   Financial Resource Strain: Low Risk  (09/30/2023)   Overall Financial Resource Strain (CARDIA)    Difficulty of Paying Living Expenses: Not hard at all  Food Insecurity: No Food Insecurity (09/30/2023)   Hunger Vital Sign    Worried About Running Out of Food in the Last Year: Never true    Ran Out of Food in the Last Year: Never true  Transportation Needs: No  Transportation Needs (09/30/2023)   PRAPARE - Administrator, Civil Service (Medical): No    Lack of Transportation (Non-Medical): No  Physical Activity: Insufficiently Active (09/30/2023)   Exercise Vital Sign    Days of Exercise per Week: 3 days    Minutes of Exercise per Session: 40 min  Stress: No Stress Concern Present (09/30/2023)   [employer] of Occupational Health - Occupational Stress Questionnaire    Feeling of Stress : Not at all  Social Connections: Moderately Integrated (09/30/2023)   Social Connection and Isolation Panel    Frequency of Communication with Friends and Family: More than three times a week    Frequency of Social Gatherings with Friends and Family: More than three times a week    Attends Religious Services: More than 4 times per year    Active Member of [employer] or Organizations: Yes    Attends Banker Meetings: 1 to 4 times per year    Marital Status: Widowed  Intimate Partner Violence: Not At Risk (09/30/2023)   Humiliation, Afraid, Rape, and Kick questionnaire    Fear of Current or Ex-Partner: No    Emotionally Abused: No    Physically Abused: No    Sexually Abused: No    Review of Systems: ROS negative except for what is noted on the assessment and plan.  Vitals:   01/06/24 1314 01/06/24 1324  BP: (!) 180/98 (!) 168/95  Pulse: 96 96  Temp: 98.1 F (36.7 C)   TempSrc: Oral   SpO2: 94%   Weight: 252 lb 6.4 oz (114.5 kg)   Height: 5' 5 (1.651 m)     Physical Exam: Constitutional: obese, sitting in chair, in no acute distress Cardiovascular: regular rate and rhythm, no m/r/g Pulmonary/Chest: normal work of breathing on room air, lungs clear to auscultation bilaterally Skin: warm and dry  Assessment & Plan:   Patient discussed with Dr. Shawn   Essential hypertension BP is 180/98 and 168/95. She has a wrist cuff to check BP which she brought today. We measured BP with her wrist cuff and it was 172/105. Managed with  Amlodipine  10 mg daily and Coreg  25 mg BID. Lisinopril  20 mg daily has been prescribed in the past but she has never started this. She feels a little overwhelmed with the thought of adding an additional medication so I will changed Amlodipine  10 mg  to Azor  10-20 mg daily to simplify the need for increased BP control. She endorses an occasional pounding HA at night. Denies chest pain.  She will record ambulatory readings and follow-up next month. Please check BMP at follow-up. I will order this as a future lab today.  Type 2 diabetes mellitus with diabetic polyneuropathy (HCC) A1c today improved to 7.0 from 8.3. Managed with Trulicity  4.5 mg/weekly, Metformin  1,000 mg BID. Will continue this, and I further counseled on diet recommendations. She loves to eat a lot of fruit so I provided which fruits to avoid/limit. Also provided dietary approaches for diabetes to further assist going forward.  Hyperlipidemia Managed with Crestor  10 mg daily. Check LDL today with goal < 70 for primary prevention given T2DM.   Norman Lobstein, D.O. [facility] Internal Medicine, PGY-2 Phone: [phone] Date 01/06/2024 Time 10:12 PM

## 2024-01-06 NOTE — Assessment & Plan Note (Signed)
 A1c today improved to 7.0 from 8.3. Managed with Trulicity  4.5 mg/weekly, Metformin  1,000 mg BID. Will continue this, and I further counseled on diet recommendations. She loves to eat a lot of fruit so I provided which fruits to avoid/limit. Also provided dietary approaches for diabetes to further assist going forward.

## 2024-01-06 NOTE — Patient Instructions (Addendum)
 Limit intake of:  Watermelon: 76 Pineapple: 66 Dates: 62 Mango: 60 Banana (ripe): 62 Raisins: 66 Lychee: 65 Cherries: 60 Grapefruit: 63 Apple (Honeycrisp): 67

## 2024-01-06 NOTE — Assessment & Plan Note (Signed)
 Managed with Crestor  10 mg daily. Check LDL today with goal < 70 for primary prevention given T2DM.

## 2024-01-06 NOTE — Assessment & Plan Note (Addendum)
 BP is 180/98 and 168/95. She has a wrist cuff to check BP which she brought today. We measured BP with her wrist cuff and it was 172/105. Managed with Amlodipine  10 mg daily and Coreg  25 mg BID. Lisinopril  20 mg daily has been prescribed in the past but she has never started this. She feels a little overwhelmed with the thought of adding an additional medication so I will changed Amlodipine  10 mg to Azor  10-20 mg daily to simplify the need for increased BP control. She endorses an occasional pounding HA at night. Denies chest pain.  She will record ambulatory readings and follow-up next month. Please check BMP at follow-up. I will order this as a future lab today.

## 2024-01-07 ENCOUNTER — Ambulatory Visit: Payer: Self-pay | Admitting: Student

## 2024-01-07 LAB — LIPID PANEL
Chol/HDL Ratio: 2.6 ratio (ref 0.0–4.4)
Cholesterol, Total: 148 mg/dL (ref 100–199)
HDL: 56 mg/dL (ref >39)
LDL Chol Calc (NIH): 75 mg/dL (ref 0–99)
Triglycerides: 91 mg/dL (ref 0–149)
VLDL Cholesterol Cal: 17 mg/dL (ref 5–40)

## 2024-01-08 NOTE — Progress Notes (Signed)
 Internal Medicine Clinic Attending  Case discussed with the resident at the time of the visit.  We reviewed the resident's history and exam and pertinent patient test results.  I agree with the assessment, diagnosis, and plan of care documented in the resident's note.  35F here for T2DM and HTN follow up. Of note, has not been taking home lisinopril . Will consolidate current regimen to Azor  to start now. Rest as below.

## 2024-01-20 ENCOUNTER — Other Ambulatory Visit: Payer: Self-pay

## 2024-01-20 DIAGNOSIS — I10 Essential (primary) hypertension: Secondary | ICD-10-CM

## 2024-01-20 MED ORDER — CARVEDILOL 25 MG PO TABS
25.0000 mg | ORAL_TABLET | Freq: Two times a day (BID) | ORAL | 3 refills | Status: AC
Start: 2024-01-20 — End: ?

## 2024-01-20 NOTE — Telephone Encounter (Unsigned)
 Copied from CRM #[id number]. Topic: Clinical - Medication Refill >> Jan 20, 2024 11:36 AM Zane F wrote: Patient is calling to refill the following prescriptions as she realized today she was out. The patient stated that the second prescription she has not had in a while and that amlodipine -olmesartan  (AZOR ) 10-20 MG tablet she found out is not for patients over 75 years of age so she would like to restart her lisinopril .   Medication:   1. carvedilol  (COREG ) 25 MG tablet 2. lisinopril  (ZESTRIL ) 20 MG tablet  Has the patient contacted their pharmacy? Yes   This is the patient's preferred pharmacy:  [facility] - [place], [place] - [address] [place] [address] Phone: [phone] Fax: [phone]   Is this the correct pharmacy for this prescription? Yes   Has the prescription been filled recently? No  Is the patient out of the medication? Yes  Has the patient been seen for an appointment in the last year OR does the patient have an upcoming appointment? Yes  Can we respond through MyChart? Yes  Agent: Please be advised that Rx refills may take up to 3 business days. We ask that you follow-up with your pharmacy.

## 2024-01-25 ENCOUNTER — Ambulatory Visit: Payer: Self-pay

## 2024-01-25 ENCOUNTER — Ambulatory Visit

## 2024-01-25 VITALS — BP 134/89 | HR 95 | Ht 64.0 in | Wt 251.0 lb

## 2024-01-25 DIAGNOSIS — N644 Mastodynia: Secondary | ICD-10-CM | POA: Diagnosis not present

## 2024-01-25 MED ORDER — ACETAMINOPHEN 500 MG PO TABS: 500.0000 mg | ORAL_TABLET | Freq: Four times a day (QID) | ORAL | 2 refills | Status: AC | PRN

## 2024-01-25 NOTE — Telephone Encounter (Signed)
 FYI Only or Action Required?: FYI only for provider.  Patient was last seen in primary care on 01/06/2024 by Marylu Gee, DO.  Called Nurse Triage reporting breat pain.  Symptoms began several days ago.  Interventions attempted: Prescription medications: gabepentin.  Symptoms are: gradually improving.  Triage Disposition: See PCP When Office is Open (Within 3 Days)  Patient/caregiver understands and will follow disposition?: Yes    Copied from CRM #[id number]. Topic: Clinical - Red Word Triage >> Jan 25, 2024 11:37 AM Laurier BROCKS wrote: Red Word that prompted transfer to Nurse Triage: Patient states she is having some pain in her breast. She states she is having some throbbing pain within a vein in her breast (right) Reason for Disposition  Change in shape or appearance of breast  Answer Assessment - Initial Assessment Questions 1. SYMPTOM: What's the main symptom you're concerned about?  (e.g., lump, nipple discharge, pain, rash)     Breast pain 2. LOCATION: Where is the pain located?     Right nipple  and breast  3. ONSET: When did pain  start?     Friday 4. PRIOR HISTORY: Do you have any history of prior problems with your breasts? (e.g., breast cancer, breast implant, fibrocystic breast disease)     no 5. CAUSE: What do you think is causing this symptom?     unknown 6. OTHER SYMPTOMS: Do you have any other symptoms? (e.g., breast pain, fever, nipple discharge, redness or rash)      Bumps around areola looks enlarged  Protocols used: Breast Symptoms-A-AH

## 2024-01-25 NOTE — Telephone Encounter (Signed)
 Pt has an appt this afternoon @ 1500PM.

## 2024-01-25 NOTE — Progress Notes (Signed)
 Acute Office Visit  Subjective:     Patient ID: Sonya Lane, female    DOB: [birth year], 73 y.o.   MRN: [id number]  Chief Complaint  Patient presents with   Breast Pain    HPI Sonya Lane is a 73 y.o. with medical history of HTN, HLD, DMII and class III obesity presenting to [facility] for an acute office visit. Last seen by Dr. Marylu for 3 month follow on 01/06/2024. Patient is in today for new right-sided breast pain that began Friday evening. She noticed while showering it was tender to the touch, but did not note any erythema, discharge, or swelling.  She denies chest pain, chills, shortness of breath.  Today, she states that the majority of the discomfort is in the nipple, and she has noticed some bumps around the nipple that are concerning to her. Her pain does not radiate down her arm, or cause any weakness.  The only medication she has taken for this pain as her scheduled gabapentin .  She endorses hot flashes, but these have been ongoing for quite some time.  She has never been on hormone replacement therapy, and had a total hysterectomy many years ago.  She states that she works out at least twice a week on Tuesdays and Thursdays, and performs rowing movements on a recumbent ski machine, with her last workout being this past Thursday. Please see problem based assessment and plan for additional details.    Review of Systems  Constitutional:  Negative for chills, fever and malaise/fatigue.  Respiratory:  Negative for shortness of breath.   Cardiovascular:  Negative for chest pain and palpitations.  Gastrointestinal:  Positive for diarrhea. Negative for abdominal pain, nausea and vomiting.       Chronic diarrhea  Musculoskeletal:        Right sided Breast pain   Skin:  Negative for itching.  Neurological:  Negative for headaches.  Psychiatric/Behavioral: Negative.          Objective:    BP 134/89 (BP Location: Right Arm, Patient Position: Sitting, Cuff Size: Large)   Pulse 95    Ht 5' 4 (1.626 m)   Wt 251 lb (113.9 kg)   BMI 43.08 kg/m   Physical Exam Constitutional:      Appearance: Normal appearance.  HENT:     Head: Normocephalic and atraumatic.     Mouth/Throat:     Mouth: Mucous membranes are moist.  Cardiovascular:     Rate and Rhythm: Normal rate and regular rhythm.     Pulses: Normal pulses.     Heart sounds: Normal heart sounds.  Pulmonary:     Effort: Pulmonary effort is normal.     Breath sounds: Normal breath sounds.  Musculoskeletal:     Comments: Breast exam of the right breast performed. No erythema, skin changes, nipple discharge, or abscesses felt on exam. Tenderness to palpation over the left and right lower quadrants of the breast, with extended tenderness into the areola.  Neurological:     Mental Status: She is alert.        Assessment & Plan:   Problem List Items Addressed This Visit       Other   Acute breast pain - Primary   Relevant Orders   MM 3D DIAGNOSTIC MAMMOGRAM UNILATERAL RIGHT BREAST   MM 3D SCREENING MAMMOGRAM UNILATERAL LEFT BREAST   US  LIMITED ULTRASOUND INCLUDING AXILLA RIGHT BREAST    Plan:  Acute Breast Pain -- physical exam showed no signs of skin  changes, discharge, or erythema concerning for mastitis. Likely musculoskeletal pain of the breast, as she has had similar breast and chest pain before. Areas of concern noted by the patient are felt to be fibroglandular densities. Patient has routine screening mammograms that have been negative for any suspicious malignant findings. Given the acute nature of the pain, less likely to be a malignant etiology. Will order diagnostic mammogram and ultrasound of the right breast to rule out any changes. Will order screening mammogram for the left breast, as patient is due for annual mammogram next month. Instructed patient to take symptom relief measures, such as Tylenol , for acute pain, and to call our office for any new symptoms or findings.   Patient seen with Dr.  Mliss Foot.  Sonya Haran, DO Internal Medicine Resident 4:54 PM 01/25/2024

## 2024-01-25 NOTE — Patient Instructions (Addendum)
 Thank you, Sonya Lane for allowing us  to provide your care today. Today we discussed the new breast pain you have been having.     I will order a diagnostic mammogram with ultrasound of the right breast for you to have done at the [facility]. If you have not heard from their office in a week, please call our office.  I did not order any labs for you today. For the pain, take over-the-counter Tylenol  for pain. You can also take Ibuprofen or Aleve  (Naproxen ) for 1 week only. I will send in a 90-day supply of Tylenol  to your mail-in pharmacy to refill.   I will call if any imaging is abnormal. All of your labs can be accessed through My Chart.   My Chart Access: [email or web address]  Please follow-up in: 3 months for your usual blood pressure and diabetes follow up.    We look forward to seeing you next time. Please call our clinic at [phone] if you have any questions or concerns. The best time to call is Monday-Friday from 9am-4pm, but there is someone available 24/7. If after hours or the weekend, call the main hospital number and ask for the Internal Medicine Resident On-Call. If you need medication refills, please notify your pharmacy one week in advance and they will send us  a request.   Thank you for letting us  take part in your care. Wishing you the best!  Sonya Lewey, DO 01/25/2024, 12:32 PM Sonya Lane Internal Medicine Residency Program

## 2024-01-26 NOTE — Progress Notes (Signed)
 Internal Medicine Clinic Attending  I was physically present during the key portions of the resident provided service and participated in the medical decision making of patient's management care. I reviewed pertinent patient test results.  The assessment, diagnosis, and plan were formulated together and I agree with the documentation in the resident's note.  Lovie Clarity, MD    [phone] woman presenting with acute right breast pain that is overall improving. Patient felt a lump on her right breast on self exam. On exam, she has no rashes, and I don't appreciate any masses today. Will evaluate with diagnostic MMG & U/S. Will continue to treat pain with tylenol .

## 2024-01-26 NOTE — Addendum Note (Signed)
 Addended by: Kinnedy Mongiello L on: 01/26/2024 09:11 AM   Modules accepted: Level of Service

## 2024-02-03 ENCOUNTER — Encounter

## 2024-02-16 ENCOUNTER — Other Ambulatory Visit: Payer: Self-pay | Admitting: Student

## 2024-02-16 DIAGNOSIS — I10 Essential (primary) hypertension: Secondary | ICD-10-CM

## 2024-02-16 NOTE — Telephone Encounter (Signed)
 Medication sent to pharmacy

## 2024-02-17 ENCOUNTER — Ambulatory Visit: Admission: RE | Admit: 2024-02-17 | Source: Ambulatory Visit

## 2024-02-17 ENCOUNTER — Ambulatory Visit
Admission: RE | Admit: 2024-02-17 | Discharge: 2024-02-17 | Disposition: A | Discharge status: Home / Self Care | Source: Ambulatory Visit | Attending: Internal Medicine | Admitting: Internal Medicine

## 2024-02-17 DIAGNOSIS — N644 Mastodynia: Secondary | ICD-10-CM

## 2024-02-22 ENCOUNTER — Ambulatory Visit: Payer: Self-pay

## 2024-02-22 NOTE — Progress Notes (Signed)
 Called and spoke with patient about normal mammogram. Patient voiced understanding.

## 2024-02-25 NOTE — Addendum Note (Signed)
 Addended by: Zakya Halabi on: 02/25/2024 09:04 AM   Modules accepted: Orders

## 2024-04-05 NOTE — Progress Notes (Deleted)
   Virtual Visit via Video Note  I connected with Sonya Lane on 04/05/24 at 10:15 AM EST by a video enabled telemedicine application and verified that I am speaking with the correct person using two identifiers.  Patient Location: Home Provider Location: Office/Clinic  I discussed the limitations, risks, security, and privacy concerns of performing an evaluation and management service by video and the availability of in person appointments. I also discussed with the patient that there may be a patient responsible charge related to this service. The patient expressed understanding and agreed to proceed.  Subjective: PCP: Waymond Cart, MD  No chief complaint on file.  HPI   ROS: Per HPI  Current Outpatient Medications:    acetaminophen  (TYLENOL ) 500 MG tablet, Take 1 tablet (500 mg total) by mouth every 6 (six) hours as needed for mild pain (pain score 1-3), moderate pain (pain score 4-6), fever or headache., Disp: 100 tablet, Rfl: 2   amlodipine -olmesartan  (AZOR ) 10-20 MG tablet, TAKE 1 TABLET BY MOUTH DAILY, Disp: 90 tablet, Rfl: 1   carvedilol  (COREG ) 25 MG tablet, Take 1 tablet (25 mg total) by mouth 2 (two) times daily., Disp: 180 tablet, Rfl: 3   Dulaglutide  (TRULICITY ) 4.5 MG/0.5ML SOAJ, Inject 4.5 mg as directed once a week., Disp: 6 mL, Rfl: 3   ezetimibe  (ZETIA ) 10 MG tablet, Take 1 tablet (10 mg total) by mouth daily., Disp: 30 tablet, Rfl: 11   gabapentin  (NEURONTIN ) 300 MG capsule, Take 1 capsule (300 mg total) by mouth 3 (three) times daily., Disp: 270 capsule, Rfl: 3   glucose blood (ACCU-CHEK GUIDE) test strip, USE TO CHECK BLOOD SUGAR ONCE  DAILY, Disp: 365 strip, Rfl: 2   linaclotide  (LINZESS ) 290 MCG CAPS capsule, Take 1 capsule (290 mcg total) by mouth daily before breakfast., Disp: , Rfl:    metFORMIN  (GLUCOPHAGE ) 1000 MG tablet, TAKE 1 TABLET BY MOUTH TWICE  DAILY WITH A MEAL, Disp: 200 tablet, Rfl: 2   Multiple Vitamins-Minerals (ONE-A-DAY 50 PLUS PO), Take 1  tablet by mouth in the morning., Disp: , Rfl:    rosuvastatin  (CRESTOR ) 10 MG tablet, Take 1 tablet (10 mg total) by mouth daily., Disp: 30 tablet, Rfl: 11  Observations/Objective: There were no vitals filed for this visit. Physical Exam  Assessment and Plan: Assessment & Plan     Follow Up Instructions: No follow-ups on file.     The patient was advised to call back or seek an in-person evaluation if the symptoms worsen or if the condition fails to improve as anticipated.    Patient seen with Dr. Jimmie [id number]}  Merric Yost, DO

## 2024-04-14 NOTE — Progress Notes (Unsigned)
   Established Patient Office Visit  Subjective   Patient ID: Sonya Lane, female    DOB: 03/12/1951  Age: 73 y.o. MRN: [id number]  No chief complaint on file.   [name] is a 73 year old female with a past medical history of hypertension, type 2 diabetes mellitus, hyperlipidemia, vitamin D  deficiency, and morbid obesity who presents to clinic today for ____. Please see problem-based assessment and plan for further details.     ROS    Objective:    There were no vitals taken for this visit. Physical Exam   No results found for any visits on 04/15/24.   The 10-year ASCVD risk score (Arnett DK, et al., 2019) is: 27.7%    Assessment & Plan:   Patient seen with {IMTSattending2025/2026:32924}.  Problem List Items Addressed This Visit   None   No follow-ups on file.    Jalen Oberry, DO Internal Medicine Resident, PGY-1 5:47 PM 04/14/2024

## 2024-04-14 NOTE — Patient Instructions (Incomplete)
 Thank you, [name] for allowing us  to provide your care today. Today we discussed your diabetes and blood pressure, as well as your leg pain.  Your A1c today was 6.8, good job!  Referrals: -Ultrasound of your legs to look for changes in your blood vessels that might be causing your pain  New medications: -  I have ordered the following labs for you:  Lab Orders         Glucose, capillary         Microalbumin / Creatinine Urine Ratio         Basic metabolic panel with GFR         POC Hbg A1C       I will call if any are abnormal. All of your labs can be accessed through My Chart.   My Chart Access: [email or web address]  Please follow-up in: 1 month to do a blood pressure check, and 3 months to recheck your A1c    We look forward to seeing you next time. Please call our clinic at [phone] if you have any questions or concerns. The best time to call is Monday-Friday from 9am-4pm, but there is someone available 24/7. If after hours or the weekend, call the main hospital number and ask for the Internal Medicine Resident On-Call. If you need medication refills, please notify your pharmacy one week in advance and they will send us  a request.   Thank you for letting us  take part in your care. Wishing you the best!  Caileb Rhue, DO 04/15/2024, 11:26 AM [facility]

## 2024-04-15 ENCOUNTER — Ambulatory Visit (INDEPENDENT_AMBULATORY_CARE_PROVIDER_SITE_OTHER)

## 2024-04-15 ENCOUNTER — Other Ambulatory Visit: Payer: Self-pay

## 2024-04-15 VITALS — BP 153/74 | HR 82 | Temp 98.1°F | Ht 65.0 in | Wt 255.0 lb

## 2024-04-15 DIAGNOSIS — M79604 Pain in right leg: Secondary | ICD-10-CM

## 2024-04-15 DIAGNOSIS — E782 Mixed hyperlipidemia: Secondary | ICD-10-CM | POA: Diagnosis not present

## 2024-04-15 DIAGNOSIS — Z7985 Long-term (current) use of injectable non-insulin antidiabetic drugs: Secondary | ICD-10-CM

## 2024-04-15 DIAGNOSIS — E1142 Type 2 diabetes mellitus with diabetic polyneuropathy: Secondary | ICD-10-CM | POA: Diagnosis not present

## 2024-04-15 DIAGNOSIS — I10 Essential (primary) hypertension: Secondary | ICD-10-CM | POA: Diagnosis not present

## 2024-04-15 DIAGNOSIS — Z79899 Other long term (current) drug therapy: Secondary | ICD-10-CM

## 2024-04-15 DIAGNOSIS — M79605 Pain in left leg: Secondary | ICD-10-CM

## 2024-04-15 DIAGNOSIS — Z7984 Long term (current) use of oral hypoglycemic drugs: Secondary | ICD-10-CM

## 2024-04-15 DIAGNOSIS — Z833 Family history of diabetes mellitus: Secondary | ICD-10-CM

## 2024-04-15 LAB — POCT GLYCOSYLATED HEMOGLOBIN (HGB A1C): HbA1c, POC (controlled diabetic range): 6.8 % (ref 0.0–7.0)

## 2024-04-15 LAB — GLUCOSE, CAPILLARY: Glucose-Capillary: 133 mg/dL — ABNORMAL HIGH (ref 70–99)

## 2024-04-15 MED ORDER — AMLODIPINE-OLMESARTAN 10-40 MG PO TABS: 1.0000 | ORAL_TABLET | Freq: Every day | ORAL | 11 refills | Status: AC

## 2024-04-15 NOTE — Assessment & Plan Note (Addendum)
 LDL 75, total cholesterol 851 on 01/2024. Currently on Crestor  10mg . Goal is LDL <70 for primary prevention. Patient cannot recall the name of her medication but knows she is only taking one cholesterol medication that sometimes will make her have weird dreams so she does not take it every night. Counseled patient on continued adherence to medication, as well as dietary changes to incorporate to help get her below 70.  -Continue Crestor  10mg  daily

## 2024-04-15 NOTE — Progress Notes (Signed)
 Internal Medicine Clinic Attending  I was physically present during the key portions of the resident provided service and participated in the medical decision making of patient's management care. I reviewed pertinent patient test results.  The assessment, diagnosis, and plan were formulated together and I agree with the documentation in the resident's note.  Shawn Sick, MD

## 2024-04-15 NOTE — Assessment & Plan Note (Signed)
 Patient notes a tingling sensation with rest and exertion in her right leg recently. Does state she has a history of peripheral neuropathy secondary to her diabetes, but does not think this is related. Counseled patient that her neuropathy can worsen over time with diet, worsening blood sugars, or weight changes. However, patient states that the tingling in her legs is new and thinks she is not getting proper blood flow to them. Discussed checking her blood vessels with US  ABIs to rule out stenosis. She denies acute pain with ambulation, skin pallor, pulselessness, or paralysis. She does note occasional coolness of her leg, however this only happens when she is sleeping and not with exertion.  -US  ABI bilateral

## 2024-04-15 NOTE — Assessment & Plan Note (Addendum)
 Last A1c 7.0% in 01/2024. Today, A1c 6.8%. Her regimen currently is Trulicity  4.5mg  weekly injected on Monday and Metformin  100mg  BID. She reports feeling well overall and no hypoglycemic events. She does not check her blood sugars. Microalbumin in 07/2023 was severely elevated at 401, however is intolerant to SGLT2i due to recurrent yeast infections. Will repeat uACR today to monitor for improvement. Foot exam today shows decreased sensation on the anterior portion of the bilateral feet and toes, but preserved sensation on the bilateral posterior feet. DP/PT pulses bilaterally diminished.   -A1c, uACR today -Continue Metformin  100mg  BID, Trulicity  4.5mg  weekly -A1c in 3 months -Foot exam to be repeated in 1 year

## 2024-04-15 NOTE — Assessment & Plan Note (Addendum)
 BP today 146/72, recheck is 153/74. Currently on carvedilol  25mg  BID and amlodipine -olmesartan  10-20mg  tablet. Denies headaches, chest pain, or SOB currently. Will increase dose of Amlodipine -Olmesartan  to 10-40mg  to help lower BP. Informed patient to return in 1 month for a BP recheck to ensure her increased dose is helping her.  -Increase Amlodipine -Olmesartan  to 10-40mg  -BMP today

## 2024-04-16 LAB — BASIC METABOLIC PANEL WITH GFR
BUN/Creatinine Ratio: 9 — ABNORMAL LOW (ref 12–28)
BUN: 7 mg/dL — ABNORMAL LOW (ref 8–27)
CO2: 24 mmol/L (ref 20–29)
Calcium: 10.2 mg/dL (ref 8.7–10.3)
Chloride: 100 mmol/L (ref 96–106)
Creatinine, Ser: 0.77 mg/dL (ref 0.57–1.00)
Glucose: 110 mg/dL — ABNORMAL HIGH (ref 70–99)
Potassium: 4.3 mmol/L (ref 3.5–5.2)
Sodium: 140 mmol/L (ref 134–144)
eGFR: 80 mL/min/1.73 (ref >59)

## 2024-04-16 LAB — MICROALBUMIN / CREATININE URINE RATIO
Creatinine, Urine: 95.5 mg/dL
Microalb/Creat Ratio: 2326 mg/g{creat} — ABNORMAL HIGH (ref 0–29)
Microalbumin, Urine: 2221.8 ug/mL

## 2024-04-19 ENCOUNTER — Ambulatory Visit: Payer: Self-pay

## 2024-04-19 NOTE — Progress Notes (Signed)
 BUN/Cr around baseline on BMP, with potential worsening from medications or dehydration. uACR showing severe proteinuria of 2326, worse than 9months ago at 401. Patient is intolerant of SGLT2i due to recurrent yeast infections. A1c currently well controlled at 6.8% on regimen, currently on ARB for HTN. Would consider adding MRA to hypertensive regimen at next visit and emphasize importance of strict glycemic control with daily BG to prevent worsening proteinuria.

## 2024-04-20 NOTE — Progress Notes (Signed)
 Attempted to call patient x2 about results.

## 2024-05-23 ENCOUNTER — Other Ambulatory Visit: Payer: Self-pay

## 2024-05-23 DIAGNOSIS — E1142 Type 2 diabetes mellitus with diabetic polyneuropathy: Secondary | ICD-10-CM

## 2024-05-23 MED ORDER — METFORMIN HCL 1000 MG PO TABS: 1000.0000 mg | ORAL_TABLET | Freq: Two times a day (BID) | ORAL | 2 refills | Status: AC

## 2024-05-23 NOTE — Telephone Encounter (Signed)
 Medication sent to pharmacy

## 2024-06-06 ENCOUNTER — Telehealth: Payer: Self-pay

## 2024-06-06 ENCOUNTER — Telehealth: Payer: Self-pay | Admitting: *Deleted

## 2024-06-06 ENCOUNTER — Other Ambulatory Visit (HOSPITAL_COMMUNITY): Payer: Self-pay

## 2024-06-06 NOTE — Telephone Encounter (Signed)
 Will forward to Dr. Creighton, Pharmacist and PCP. Copied from CRM [id number]. Topic: Clinical - Medication Question >> Jun 06, 2024 11:33 AM Sonya Lane wrote: Reason for CRM: pt states she is confused with all the medications need some assistance with her medications, she states she doesn't know the day and night medications, or the ones she is not supposed to be taking anymore.

## 2024-06-06 NOTE — Telephone Encounter (Signed)
 In process of mailing Sonya Lane re-enrollment application to patients home for continued Trulicity  assistance.

## 2024-06-06 NOTE — Telephone Encounter (Signed)
 In process of mailing [employer] re-enrollment application for Linzess  patient assistance.

## 2024-06-07 NOTE — Telephone Encounter (Signed)
 Call to patient  No answer-message left on recorder regarding appt with Dr Brinda (PharmD) [facility] Pharmacist  Appt made for 06/13/24 at 1530  Will await call back from pt to see if time is ok.

## 2024-06-08 NOTE — Telephone Encounter (Signed)
 PAP: Patient assistance application for Trulicity  through [employer] has been mailed to pt's home address on file.

## 2024-06-08 NOTE — Telephone Encounter (Signed)
 PAP: Patient assistance application for LINZESS  through [employer] [facility]) has been mailed to pt's home address on file.

## 2024-06-13 ENCOUNTER — Ambulatory Visit: Payer: Self-pay

## 2024-06-13 VITALS — BP 159/95 | HR 83

## 2024-06-13 DIAGNOSIS — Z23 Encounter for immunization: Secondary | ICD-10-CM

## 2024-06-13 DIAGNOSIS — I10 Essential (primary) hypertension: Secondary | ICD-10-CM

## 2024-06-13 NOTE — Progress Notes (Signed)
 "  06/13/2024 Name: Sonya Lane MRN: [id number] DOB: 29-Dec-1950  Chief Complaint  Patient presents with   Hypertension    Sonya Lane is a 74 y.o. year old female who was referred for medication management by their primary care provider, Waymond Cart, MD. They presented for a face to face visit today.   They were referred to the pharmacist by RN at [facility] clinic for assistance in managing medications . PMH includes HTN, IBS, T2DM, HLD, BMI > 40.   Subjective: Patient was last seen by PCP, Dr. Isobel, on 04/15/24. At last visit, BP was 153/74 mmHg, HR 82. Amlodipine -olmesartan  was increased to 10-40 mg daily. A1C was 6.8% on Trulicity  and metformin . UACR was worsened, now > 2000 mg/g. Ezetimibe  was discontinued due to bad dreams.  Today, patient presents in  good spirits and presents without  any assistance. Brought her medications with her for review. States she has not taken any medication in one week because she started developing hand shaking and palpitations. Said she could feel her heartbeat and it was very loud. This resolved after she stopped taking her medications. Upon reviewing her medication bottles, appears patient had been taking both amlodipine  10 mg daily AND amlodipine -olmesartan  10-20 mg daily. She did not have the bottle of amlodipine -olmesartan  10-40 mg with her today, but says it is in her bag of new medications from [facility] pharmacy at home. She has not received renewal applications for Linzess  or Trulicity  yet. Has 2 or 3 boxes of Trulicity  remaining.   Care Team: Primary Care Provider: Waymond Cart, MD ; Next Scheduled Visit: needs to be scheduled   Medication Access/Adherence  Current Pharmacy:  [facility], [place] - [address] [place] [address] Phone: [phone] Fax: [phone]  [facility] [employer] [facility] [place], [place] - [address] [place] [address] Phone: [phone] Fax: [phone]  [facility] [place], [place] - [address] [place] [address] Phone: [phone] Fax: [phone]   Patient reports affordability concerns with their medications: No  Patient reports access/transportation concerns to their pharmacy: No  - uses [facility] pharmacy Patient reports adherence concerns with their medications:  Yes  - listed above   Diabetes:  Current medications: Trulicity  4.5 mg weekly (has 2 or 3 boxes remaining from PAP), metformin  1000 mg BID  Hypertension:  Current medications: amlodipine -olmesartan  10-40 mg daily (still taking amlodipine -olmesartan  10-20 mg daily), still taking individual bottle of amlodipine  10 mg daily  Patient has a wrist BP cuff - SBP and DBP were reading about 10 mmHg higher when compared to clinic cuff in 2025 Current blood pressure readings readings: n/a  Patient denies hypotensive s/sx including dizziness, lightheadedness.  Patient denies hypertensive symptoms including headache, chest pain, shortness of breath  Hyperlipidemia/ASCVD Risk Reduction  Current lipid lowering medications: rosuvastatin  10 mg daily  Objective:  BP Readings from Last 3 Encounters:  06/13/24 (!) 159/95  04/15/24 (!) 153/74  01/25/24 134/89    Lab Results  Component Value Date   HGBA1C 6.8 04/15/2024   HGBA1C 7.0 (A) 01/06/2024   HGBA1C 8.3 (A) 10/05/2023       Latest Ref Rng & Units 04/15/2024   11:38 AM 07/06/2023    4:19 PM 06/13/2022   12:00 PM  BMP  Glucose 70 - 99 mg/dL 889  882  851  BUN 8 - 27 mg/dL 7  10  10    Creatinine 0.57 - 1.00 mg/dL 9.22  9.19  9.09   BUN/Creat Ratio 12 - 28 9  13  11    Sodium 134 - 144 mmol/L 140  140  140   Potassium 3.5 - 5.2 mmol/L 4.3  3.8  4.1   Chloride 96 - 106 mmol/L 100  98  96   CO2 20 - 29 mmol/L 24  23  26    Calcium  8.7 - 10.3 mg/dL 89.7  89.3  88.9     Lab Results   Component Value Date   CHOL 148 01/06/2024   HDL 56 01/06/2024   LDLCALC 75 01/06/2024   TRIG 91 01/06/2024   CHOLHDL 2.6 01/06/2024    Medications Reviewed Today     Reviewed by Brinda Lorain SQUIBB, RPH-CPP (Pharmacist) on 06/13/24 at 1937  Med List Status: <None>   Medication Order Taking? Sig Documenting Provider Last Dose Status Informant  acetaminophen  (TYLENOL ) 500 MG tablet [id number]  Take 1 tablet (500 mg total) by mouth every 6 (six) hours as needed for mild pain (pain score 1-3), moderate pain (pain score 4-6), fever or headache. Amilibia, Jaden, DO  Active   amLODipine -olmesartan  (AZOR ) 10-40 MG tablet [id number]  Take 1 tablet by mouth daily.  Patient not taking: Reported on 06/13/2024   Amilibia, Jaden, DO  Active   carvedilol  (COREG ) 25 MG tablet [id number] Yes Take 1 tablet (25 mg total) by mouth 2 (two) times daily. Waymond Cart, MD  Active   Dulaglutide  (TRULICITY ) 4.5 MG/0.5ML EMMANUEL [id number] Yes Inject 4.5 mg as directed once a week. Gabino Boga, MD  Active   gabapentin  (NEURONTIN ) 300 MG capsule [id number] Yes Take 1 capsule (300 mg total) by mouth 3 (three) times daily. Fernand Prost, MD  Active   glucose blood (ACCU-CHEK GUIDE) test strip [id number]  USE TO CHECK BLOOD SUGAR ONCE  DAILY Teresa Carrier, MD  Active   linaclotide  (LINZESS ) 290 MCG CAPS capsule [id number] Yes Take 1 capsule (290 mcg total) by mouth daily before breakfast. Fernand Prost, MD  Active   Magnesium Citrate (MAGNESIUM GUMMIES PO) [id number] Yes Take 1,000 mg by mouth at bedtime. [provider]  Active   metFORMIN  (GLUCOPHAGE ) 1000 MG tablet [id number] Yes Take 1 tablet (1,000 mg total) by mouth 2 (two) times daily with a meal. Waymond Cart, MD  Active   Multiple Vitamins-Minerals (ONE-A-DAY 50 PLUS PO) [id number] Yes Take 1 tablet by mouth in the morning. [provider]  Active   rosuvastatin  (CRESTOR ) 10 MG tablet [id number] Yes Take 1 tablet (10 mg total) by mouth daily. Fernand Prost, MD   Active               Assessment/Plan:   Hypertension: - Currently uncontrolled with clinic BP consistently above goal less than 130/80. BP today elevated in the setting of patient not taking any medication in one week. Suspect side effect of palpitations and tremors was potentially related to double dose of amlodipine  (was taking a total of amlodipine  20 mg daily). Discarded bottle of amlodipine  10 mg today and educated patient to trade out amlodipine -olmesartan  10-20 mg dose for 10-40 mg dose when she gets home. She expressed understanding via teach back method. - Reviewed long term cardiovascular and renal outcomes of uncontrolled blood pressure - Reviewed appropriate blood pressure monitoring technique and reviewed goal blood pressure. Recommended to check home blood pressure and heart rate  once daily and keep a log to  bring to upcoming appointments. Advised to buy upper arm cuff if able. Educated on availability of Omron cuff at [facility] pharmacy. - Recommend to restart amlodipine -olmesartan  at 10-40 mg daily.  - Discarded old bottle of amlodipine  10 mg daily. Marked current bottle of amlodipine -olmesartan  10-20 mg with a large black X, and patient will discard this once she finds new bottle of amlodipine -olmesartan  10-40 mg at home.   - Plan for repeat BMP in 3-4 weeks to monitor K and Scr with increased dose of ARB  Assisted patient in renewal applications for Trulicity  and Linzess  today.  Written patient instructions provided. Patient verbalized understanding of treatment plan.   Follow Up Plan:  Pharmacist 07/11/24 PCP clinic visit ~3 mo  Lorain Baseman, PharmD [facility] [phone]   "

## 2024-06-13 NOTE — Patient Instructions (Addendum)
 It was nice to see you today!  Your goal blood pressure is less than 130/80 mmHg. If you are able to purchase an upper arm cuff, it will be more accurate. We prefer the brand Omron, and sell them at our [facility] for $34.  Medication Changes: We removed amlodipine  10 mg from your medications today.   When you get home, make sure you are taking the higher dose of your Azor  (amlodipine -olmesartan ) 10-40 mg daily. You can then get rid of the bottle of Azor  (amlodipine -olmesartan ) 10-20 mg daily.   We will have you return in about one month to check your labs.  Continue all other medication the same.   Lorain Baseman, PharmD [facility] [phone]

## 2024-06-14 NOTE — Telephone Encounter (Signed)
 Pt completed her portion in office.  In process of completing provider portion.

## 2024-06-15 NOTE — Telephone Encounter (Signed)
 Application placed in yellow teams box, awaiting signature.

## 2024-06-15 NOTE — Telephone Encounter (Signed)
 Pcp pg 8 placed in yellow teams box for signature.

## 2024-06-16 ENCOUNTER — Other Ambulatory Visit: Payer: Self-pay

## 2024-06-16 DIAGNOSIS — I10 Essential (primary) hypertension: Secondary | ICD-10-CM

## 2024-06-16 NOTE — Telephone Encounter (Unsigned)
 Copied from CRM [id number]. Topic: Clinical - Medication Refill >> Jun 16, 2024 11:34 AM Debby BROCKS wrote: Medication: amLODipine -olmesartan  (AZOR ) 10-40 MG tablet  Has the patient contacted their pharmacy? Yes (Agent: If no, request that the patient contact the pharmacy for the refill. If patient does not wish to contact the pharmacy document the reason why and proceed with request.) (Agent: If yes, when and what did the pharmacy advise?)  Patient states that she has no refills left on it  This is the patient's preferred pharmacy:  [facility] - [place], [place] - [address] [place] [address] Phone: [phone] Fax: [phone]  Is this the correct pharmacy for this prescription? Yes If no, delete pharmacy and type the correct one.   Has the prescription been filled recently? No  Is the patient out of the medication? No  Has the patient been seen for an appointment in the last year OR does the patient have an upcoming appointment? Yes  Can we respond through MyChart? Yes  Agent: Please be advised that Rx refills may take up to 3 business days. We ask that you follow-up with your pharmacy.

## 2024-06-16 NOTE — Telephone Encounter (Signed)
 Sonya Lane  was refilled 04/2023 with 11 refills. Called pt - no answer; left message on vm pt to call [facility] [place] [facility].

## 2024-06-18 ENCOUNTER — Other Ambulatory Visit: Payer: Self-pay | Admitting: Internal Medicine

## 2024-06-18 DIAGNOSIS — I10 Essential (primary) hypertension: Secondary | ICD-10-CM

## 2024-06-18 DIAGNOSIS — E1142 Type 2 diabetes mellitus with diabetic polyneuropathy: Secondary | ICD-10-CM

## 2024-06-18 DIAGNOSIS — G59 Mononeuropathy in diseases classified elsewhere: Secondary | ICD-10-CM

## 2024-06-20 ENCOUNTER — Telehealth: Payer: Self-pay | Admitting: Pharmacy Technician

## 2024-06-20 NOTE — Progress Notes (Signed)
" ° °  06/20/2024  Patient ID: Sonya Lane, female   DOB: 24-Sep-1950, 74 y.o.   MRN: [id number]  Patient engaged with clinical pharmacist for management of hypertension on 06/13/2024. Outreach by [employer] technician was requested.   Outreached patient to discuss hypertension medication management. Left voicemail for patient to return my call at their convenience.    [name], CPhT [facility] Office: [phone] Email: [email or web address]  "

## 2024-06-22 ENCOUNTER — Telehealth: Payer: Self-pay | Admitting: Pharmacy Technician

## 2024-06-22 NOTE — Progress Notes (Signed)
" ° °  06/22/2024  Patient ID: Sonya Lane, female   DOB: 12-04-1950, 74 y.o.   MRN: [id number]  Patient engaged with clinical pharmacist for management of hypertension on 06/13/2024. Outreach by [employer] technician was requested.   Outreached patient to discuss hypertension medication management. Left voicemail for patient to return my call at their convenience.    Kenly Henckel, CPhT [facility] Office: [phone] Email: [email or web address]  "

## 2024-06-24 ENCOUNTER — Telehealth: Payer: Self-pay | Admitting: Pharmacy Technician

## 2024-06-24 NOTE — Progress Notes (Signed)
" ° °  06/24/2024 Name: RIM THATCH MRN: [id number] DOB: 1951-04-13  Patient engaged with clinical pharmacist for management of hypertension on 06/13/2024. Outreach by [employer] technician was requested.   Outreached patient to discuss hypertension medication management. Left voicemail for patient to return my call at their convenience.  Will send patient MyChart message today.  Lamarkus Nebel, CPhT [facility] Office: [phone] Email: [email or web address]    "

## 2024-06-29 ENCOUNTER — Other Ambulatory Visit (HOSPITAL_COMMUNITY): Payer: Self-pay

## 2024-06-29 NOTE — Telephone Encounter (Signed)
 PAP: RE-ENROLLMENT Application for LINZESS  has been submitted to [employer]), via fax

## 2024-06-29 NOTE — Telephone Encounter (Signed)
 PCP pg completed, awaiting pt pages.

## 2024-07-11 ENCOUNTER — Ambulatory Visit: Payer: Self-pay

## 2024-10-05 ENCOUNTER — Ambulatory Visit
